# Patient Record
Sex: Female | Born: 1955 | Race: White | Hispanic: No | Marital: Single | Smoking: Former smoker
Health system: Southern US, Community
[De-identification: ages and names within clinical notes are randomized; demographics above are authoritative.]

## PROBLEM LIST (undated history)

## (undated) DIAGNOSIS — M1990 Unspecified osteoarthritis, unspecified site: Secondary | ICD-10-CM

## (undated) DIAGNOSIS — E119 Type 2 diabetes mellitus without complications: Secondary | ICD-10-CM

## (undated) DIAGNOSIS — E039 Hypothyroidism, unspecified: Secondary | ICD-10-CM

## (undated) DIAGNOSIS — T7840XA Allergy, unspecified, initial encounter: Secondary | ICD-10-CM

## (undated) DIAGNOSIS — Z8739 Personal history of other diseases of the musculoskeletal system and connective tissue: Secondary | ICD-10-CM

## (undated) DIAGNOSIS — K219 Gastro-esophageal reflux disease without esophagitis: Secondary | ICD-10-CM

## (undated) DIAGNOSIS — G479 Sleep disorder, unspecified: Secondary | ICD-10-CM

## (undated) DIAGNOSIS — I10 Essential (primary) hypertension: Secondary | ICD-10-CM

## (undated) DIAGNOSIS — Z8709 Personal history of other diseases of the respiratory system: Secondary | ICD-10-CM

## (undated) DIAGNOSIS — C541 Malignant neoplasm of endometrium: Secondary | ICD-10-CM

## (undated) DIAGNOSIS — C439 Malignant melanoma of skin, unspecified: Secondary | ICD-10-CM

## (undated) DIAGNOSIS — K5792 Diverticulitis of intestine, part unspecified, without perforation or abscess without bleeding: Secondary | ICD-10-CM

## (undated) HISTORY — PX: BREAST BIOPSY: SHX20

## (undated) HISTORY — PX: OTHER SURGICAL HISTORY: SHX169

## (undated) HISTORY — PX: THYROIDECTOMY: SHX17

## (undated) HISTORY — DX: Allergy, unspecified, initial encounter: T78.40XA

## (undated) HISTORY — PX: CHOLECYSTECTOMY: SHX55

## (undated) HISTORY — DX: Type 2 diabetes mellitus without complications: E11.9

---

## 1997-05-20 ENCOUNTER — Other Ambulatory Visit: Admission: RE | Admit: 1997-05-20 | Discharge: 1997-05-20 | Payer: Self-pay | Admitting: Endocrinology

## 2000-07-10 ENCOUNTER — Inpatient Hospital Stay (HOSPITAL_COMMUNITY): Admission: EM | Admit: 2000-07-10 | Discharge: 2000-07-14 | Payer: Self-pay

## 2001-06-18 ENCOUNTER — Other Ambulatory Visit: Admission: RE | Admit: 2001-06-18 | Discharge: 2001-06-18 | Payer: Self-pay | Admitting: Internal Medicine

## 2001-07-01 ENCOUNTER — Encounter: Admission: RE | Admit: 2001-07-01 | Discharge: 2001-07-01 | Payer: Self-pay | Admitting: Internal Medicine

## 2001-07-01 ENCOUNTER — Encounter: Payer: Self-pay | Admitting: Internal Medicine

## 2005-08-13 ENCOUNTER — Emergency Department (HOSPITAL_COMMUNITY): Admission: EM | Admit: 2005-08-13 | Discharge: 2005-08-14 | Payer: Self-pay | Admitting: Emergency Medicine

## 2005-08-13 ENCOUNTER — Encounter: Payer: Self-pay | Admitting: Emergency Medicine

## 2006-05-21 ENCOUNTER — Encounter (INDEPENDENT_AMBULATORY_CARE_PROVIDER_SITE_OTHER): Payer: Self-pay | Admitting: *Deleted

## 2006-05-21 ENCOUNTER — Other Ambulatory Visit: Admission: RE | Admit: 2006-05-21 | Discharge: 2006-05-21 | Payer: Self-pay | Admitting: Interventional Radiology

## 2006-05-21 ENCOUNTER — Encounter: Admission: RE | Admit: 2006-05-21 | Discharge: 2006-05-21 | Payer: Self-pay | Admitting: Surgery

## 2007-04-06 ENCOUNTER — Other Ambulatory Visit: Admission: RE | Admit: 2007-04-06 | Discharge: 2007-04-06 | Payer: Self-pay | Admitting: Family Medicine

## 2008-08-26 ENCOUNTER — Encounter (INDEPENDENT_AMBULATORY_CARE_PROVIDER_SITE_OTHER): Payer: Self-pay | Admitting: Surgery

## 2008-08-26 ENCOUNTER — Ambulatory Visit (HOSPITAL_COMMUNITY): Admission: RE | Admit: 2008-08-26 | Discharge: 2008-08-27 | Payer: Self-pay | Admitting: Surgery

## 2009-09-29 ENCOUNTER — Emergency Department (HOSPITAL_COMMUNITY): Admission: EM | Admit: 2009-09-29 | Discharge: 2009-09-29 | Payer: Self-pay | Admitting: Family Medicine

## 2009-11-14 ENCOUNTER — Ambulatory Visit: Payer: Self-pay | Admitting: Internal Medicine

## 2009-11-22 ENCOUNTER — Ambulatory Visit (HOSPITAL_COMMUNITY): Admission: RE | Admit: 2009-11-22 | Discharge: 2009-11-22 | Payer: Self-pay | Admitting: Internal Medicine

## 2009-12-28 ENCOUNTER — Encounter (INDEPENDENT_AMBULATORY_CARE_PROVIDER_SITE_OTHER): Payer: Self-pay | Admitting: Family Medicine

## 2009-12-28 LAB — CONVERTED CEMR LAB
AST: 35 units/L (ref 0–37)
BUN: 13 mg/dL (ref 6–23)
CO2: 30 meq/L (ref 19–32)
Calcium: 9.4 mg/dL (ref 8.4–10.5)
Chloride: 104 meq/L (ref 96–112)
Cholesterol: 157 mg/dL (ref 0–200)
Creatinine, Ser: 0.76 mg/dL (ref 0.40–1.20)
HDL: 35 mg/dL — ABNORMAL LOW (ref >39)
Total CHOL/HDL Ratio: 4.5
Triglycerides: 193 mg/dL — ABNORMAL HIGH (ref <150)

## 2010-04-12 LAB — POCT I-STAT, CHEM 8
BUN: 10 mg/dL (ref 6–23)
Calcium, Ion: 1.1 mmol/L — ABNORMAL LOW (ref 1.12–1.32)
Chloride: 102 mEq/L (ref 96–112)
Creatinine, Ser: 0.8 mg/dL (ref 0.4–1.2)
Glucose, Bld: 154 mg/dL — ABNORMAL HIGH (ref 70–99)

## 2010-05-06 LAB — BASIC METABOLIC PANEL
BUN: 10 mg/dL (ref 6–23)
Chloride: 101 mEq/L (ref 96–112)
Chloride: 102 mEq/L (ref 96–112)
Creatinine, Ser: 0.81 mg/dL (ref 0.4–1.2)
GFR calc Af Amer: >60 mL/min (ref >60)
Glucose, Bld: 179 mg/dL — ABNORMAL HIGH (ref 70–99)
Potassium: 2.8 mEq/L — ABNORMAL LOW (ref 3.5–5.1)

## 2010-05-06 LAB — CREATININE, SERUM: GFR calc Af Amer: >60 mL/min (ref >60)

## 2010-05-06 LAB — HEMOGLOBIN: Hemoglobin: 12.7 g/dL (ref 12.0–15.0)

## 2010-05-06 LAB — PREGNANCY, URINE: Preg Test, Ur: NEGATIVE

## 2010-05-06 LAB — HEMOGLOBIN AND HEMATOCRIT, BLOOD
HCT: 42 % (ref 36.0–46.0)
Hemoglobin: 14.6 g/dL (ref 12.0–15.0)

## 2010-05-06 LAB — CALCIUM: Calcium: 7.7 mg/dL — ABNORMAL LOW (ref 8.4–10.5)

## 2010-06-12 NOTE — Op Note (Signed)
NAME:  Donna [name]                  ACCOUNT NO.:  [id number]   MEDICAL RECORD NO.:  [id number]          PATIENT TYPE:  AMB   LOCATION:  DAY                          FACILITY:  [facility]   PHYSICIAN:  Ardeth Sportsman, MD     DATE OF BIRTH:  10-02-1955   DATE OF PROCEDURE:  08/26/2008  DATE OF DISCHARGE:                               OPERATIVE REPORT   PRIMARY CARE PHYSICIAN:  Carma Leaven, DO, Scherrie Bateman. Sandridge, PA-C as  well as Sigmund Hazel, M.D.   SURGEON:  Ardeth Sportsman, MD   ASSISTANT:  1. Angelia Mould. Derrell Lolling, M.D., F.A.C.S.  2. Adolph Pollack, M.D., F.A.C.S.   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   POSTOPERATIVE DIAGNOSES:  1. Multinodular nodular goiter with increasing globus and symptoms.  2. Abnormal skin mole in left infraclavicular fossa.   PROCEDURE PERFORMED:  1. Total thyroidectomy.  2. Excision of left infraclavicular skin mole.   ANESTHESIA:  1. General anesthesia.  2. Local anesthetic in field block.   SPECIMENS:  Thyroid.  It came in 2 pieces.  The smaller piece is the  right superior pole with a double long stitch through the superior pole.  The larger mass has a short stitch in the right inferior pole and a long  single stitch in the left superior pole.   DRAINS:  A 15-French Blake drain comes through the left infraclavicular  fossa and then into the left neck going superficially with the tail  going below the strap muscles.   ESTIMATED BLOOD LOSS:  200 mL.   COMPLICATIONS:  None apparent.   INDICATIONS:  Donna [name] is a 55 year old female who has been struggling  with enlarging neck mass.  I saw her 2 years ago for multinodular  goiter.  There were some nodules that were concerning but biopsy is  consistent with nonneoplastic goiter.  At the time she was not  interested in surgery.  However, 2 years later they have gotten much  larger despite efforts to control it.  Her thyroid stimulating  hormone  level is actually low and she has been on hormonal suppression for some  time with enlarging masses.   Anatomy/physiology of neck and thyroid and parathyroid function was  discussed.  Pathophysiology of goiter was discussed.  Differential  diagnoses discussed.  After discussion, recommendations were made for  total thyroidectomy.  Risks, benefits, alternatives were discussed in  detail.  Questions answered.  She agreed to proceed.   FINDINGS:  She had a mildly enlarged right thyroid lobe with a dense  right inferior calcified nodule near her trachea.  Her left thyroid  gland was quite large, up to 15 cm in size and very engorged but less  discrete masses.   DESCRIPTION OF PROCEDURE:  Informed consent was confirmed.  The patient  received IV antibiotics just prior to surgery.  She had sequential  pressure devices active during the entire case.  She underwent general  anesthesia without difficulty.  She was positioned with a pad placed  between the shoulder blades  to gently extend her neck but avoid  hyperextension.  Her arms were tucked.  She was placed in the beach-  chair reverse Trendelenburg position.  Her chin to upper chest were  prepped and draped in sterile fashion.  Surgical time-out confirmed our  plan.   I initially started with a 6-cm and expanded to an 8-cm collar incision  about 1-1/2 fingerbreadths above the clavicles right over the top part  of the mass.  I was able to come through the platysma and create  superior and inferior subplatysmal planes.  The anatomy on the strap  muscles was rather twisted but we eventually found the midline and went  through that safely with cautery.  The strap muscles were stretched and  flattened.   Attention was turned towards the right side.  I freed the strap muscles  off the thyroid.  Initially when I was freeing off, I was actually  freeing off the enlarged left thyroid gland medial aspect which was over  the midline.   Eventually I was able to come down and find the isthmus  and carry it over towards more the right gland.  I was able to free and  mobilize the lateral part of the thyroid gland more posteriorly.  I  found the middle vein, was able to see that and isolate that with clips  and ultrasonic dissection.  Most of the dissection done anteriorly was  careful blunt and focused cautery and ultrasonic dissection.  On the  posterior wall, cautery was used at a minimum and only on the anterior  trachea.   With the middle part of the thyroid lobe mobilized, attention was turned  toward the right inferior lobe.  I was gradually able to come around  that and find a good candidate for the inferior parathyroid gland.  I  stayed very close on the anterior surface of the thyroid and came across  it.  The inferior thyroid vessels were isolated, skeletonizing and  controlled with clips and ultrasonic dissection.  She had a dense  inferior nodule on the medial aspect of the inferior part of the right  thyroid gland which I was able to carefully peel that off the trachea  posteriorly to anteriorly.   Attention was turned towards the right superior pole.  I eventually was  able to come off and free things off bluntly laterally and then come on  the medial part of the superior pole and freed off the cricopharyngeal  muscles until it came to a point more cephalad.  A couple good  candidates for superior parathyroid glands were seen and carefully  protected at all times.  I came to the superior thyroid vessels.  These  were isolated and controlled using 2-0 silk ties and clips and  ultrasonic dissection.  I eventually was able to free that off.  With  that, I was able to mobilize the thyroid gland well and peel that off.  A good candidate for the right laryngeal nerve could be seen going  posterior to the inferior parathyroid gland and going more deeply and I  stayed superficial to it.  Eventually I peeled off the  thyroid gland off  the trachea and used cautery to help free it off anteriorly and come  over a little bit to left side.  However, since the anatomy was rather  distorted, I did not want to go more laterally on the other side.   Next attention was turned to the left side.  Dissection was carried out  in a mirror image fashion on the left side.  The inferior pole actually  came up much more easily and I saw the inferior parathyroid gland on the  left side well and preserved that.  With that, I could come around to a  very large left middle vein and controlled that with 2-0 silk ties and  clips.  With that, it was hard to come up over the top but eventually  was able to bluntly free off the strap muscles anteriorly.  I came  posteriorly and stayed close to the gland for a little while.  With that  I finally could pop out the very large thyroid gland through the  incision and better see the superior gland.  Carefully peeled off  attachments off the superior pole using ultrasonic dissection superiorly  until I could see the superior vessels that were actually a little more  posterior than I initially expected and controlled those again with silk  ties and clips and ultrasonic dissection.  With that, I could come  around the horn and free off the remaining attachments to the anterior  trachea and with that the specimen was removed.   Note there was some bleeding of some very dilated veins on the left  superior pole and that is where most of the bleeding occurred.  We  eventually were able to control that.  Inspection was made on the left  side and the left recurrent laryngeal nerve could be seen going  posterior to the inferior parathyroid complex and I stayed superficial  to that and away from that so I felt pretty confident that that nerve  avoided injury as well.   Copious irrigation was done.  There were some small oozers on the right  and left inferior parathyroid glands that were  controlled by elevating  and clipping on the domes.  There were a couple of oozers on the trachea  that I controlled with touch-point cautery.  Copious irrigation done  with clear return at the end.  I placed some postage stamp size  Surgicels in both fossae as well.  Looking down, I could see the carotid  gland very well and actually the left thyroid gland had gone all the way  down to the spine and wrapped around the carotid once it had been all  freed off, but all vessels seemed viable and safe.  The strap muscles  were a little beaten up on the left side since it was extremely  stretched by the very dilated gland, but I was able to reapproximate it  using interrupted 3-0 Vicryl stitches.   I saw an abnormal skin mole in the infraclavicular fossa which was  removed by scalpel only.  I tunneled a 15-French Blake drain from the  infraclavicular fossa over the clavicle and then brought out through the  wound.  There was initially some bleeding I think from an external  jugular side branch but, after pressure was held for 15 minutes and re-  inspection and the branch was isolated and controlled, hemostasis was  excellent.  The drain was secured to the skin using a 2-0 nylon stitch.  I rolled the drain so that it came down on the inferior aspect in the  subplatysmal plane and then curled around and then went in through the  left strap muscle right over on top of the trachea to help cover up both  layers well.  The platysma was reapproximated using interrupted  3-0  Vicryl stitches.  Skin was closed using 4-0 Monocryl running stitch and  Steri-Strips.  The patient was extubated and sent to recovery to stable  condition.   I discussed postoperative care with the patient in detail and will  discuss again with the family now.      Ardeth Sportsman, MD  Electronically Signed     SCG/MEDQ  D:  08/26/2008  T:  08/26/2008  Job:  [id number]   cc:   Sigmund Hazel, M.D.  Fax: [phone]   Scherrie Bateman. [place], PA   Oswaldo Conroy [place], [place]  Fax: [phone]

## 2010-06-15 NOTE — H&P (Signed)
[place]  Patient:    Donna [name], Donna [name]                         MRN: [id number] Adm. Date:  [id number] Attending:  Anastasio Auerbach CC:         Dellis Anes. Idell Pickles, M.D.   History and Physical  DATE OF BIRTH:  08/27/1955.  CHIEF COMPLAINT:  "My stomach hurts."  HISTORY OF PRESENT ILLNESS:  Donna [name] is a 55 year old heavy-set Caucasian female with a history of thyroid disease.  Presents with a two-day history of abdominal pain.  It started out feeling like menstrual cycle cramps but over the past 24 hours has progressed to severe pain increased with movement or walking.  Donna [name] had a hard-formed stool on June 12.  Donna [name] has not noted any blood in Donna stools.  No vomiting, no fevers documented.  Donna [name] was seen in the clinic today and noted to have some rebound tenderness and is referred to the emergency room for further evaluation.  Donna [name] has had an open cholecystectomy in the past but no previous surgeries or history of diverticulitis.  Donna [name] has no risk factors at this time for pelvic inflammatory disease.  ALLERGIES:  No known drug allergies.  CURRENT MEDICATIONS:  Levothroid 25 mcg q.d.  PAST MEDICAL HISTORY: 1. Goiter/hypothyroidism. 2. Perimenopausal    a. Sporadic and irregular periods. 3. Tobacco use. 4. Cholecystectomy 17 years ago. 5. Seasonal allergies as well as a history of bronchitis.  SOCIAL HISTORY:  Donna [name] is married, but Donna husband has not lived with Donna for two years.  Donna [name] smokes a few cigarettes daily.  Donna [name] denies any alcohol use.  Donna [name] works for [employer].  FAMILY HISTORY:  Donna [name] died with diabetes and cancer.  Donna mother is 5 years old and currently lives with Donna.  REVIEW OF SYSTEMS:  No visual changes.  No difficulty swallowing.  No significant headache or stiff neck.  No cough or hemoptysis.  No chest pain. Abdominal symptoms as described above.  No difficulty urinating.  No joint swelling.  No edema.  No history  of cardiac, kidney, or liver problems. Thyroid disease as mentioned above, controlled.  No diabetes or seizures.  PHYSICAL EXAMINATION:  GENERAL:  Donna [name] is alert and cooperative and in no acute distress when resting comfortably.  VITAL SIGNS:  Temperature 97.5, BP 128/59, heart rate 85, respiratory rate 21, oxygen saturation 95% on room air.  HEENT:  Pupils are equal, round and reactive to light.  Extraocular muscles intact.  Cranial nerves 2-12 grossly intact.  Atraumatic.  Normocephalic. Mucous membranes are slightly dry.  Pharynx is clear.  NECK:  Supple.  Diffuse goiter noted.  No JVD.  No bruits.  LUNGS:  Slightly decreased at the bases without evidence of crackles or rhonchi.  No wheezing.  HEART:  Regular.  Normal S1, S2.  No audible murmurs, rubs, or gallops.  ABDOMEN:  No audible bowel sounds.  Obese.  Not significantly distended. Tender suprapubically and paraumbilically.  There are peritoneal signs with evidence of discomfort both pushing in and releasing.  No palpable mass.  No organomegaly.  EXTREMITIES:  No edema.  No ______ .  Donna [name] does have resolving poison ivy on Donna left upper extremity.  NEUROLOGIC EXAM:  Donna [name] is awake, alert, and oriented x 3.  Motor exam reveals 5/5 strength bilaterally.  Sensation intact.  Gait not assessed. Reflexes 2+ bilaterally.  Cranial nerves mentioned above.  STUDIES:  EKG:  Normal sinus rhythm.  Rate 81.  X-rays from the office (I did not see the hard copy but verbal report from Dr. Clarene Duke):  Some air fluid levels.  No definite obstruction.  Labs:  Hemoglobin 12.1, MCV 86.  WBC 16,300.  ANC 15,000.  Morphology unremarkable.  Platelet count 300.  Sodium 138, potassium 3.0, chloride 108, bicarb 24, glucose 149, BUN 10, creatinine 0.8, total bilirubin 0.9, alkaline phosphatase 66, SGOT 13, SGPT 13, albumin 2.9, calcium 8.1.  PT 15.5 (normal 11.5-14.7), PTT 34, amylase and lipase normal.  Urine pregnancy  negative. Urinalysis:  Specific gravity is 1.028, 100 glucose, small bilirubin, 15 ketones, moderate blood, 30 protein, negative leukocyte esterase and nitrite. Microscopic:  0-3 WBCs, 4-6 RBCs.  Blood cultures x 2 done.  CK 49, MB 0.5, troponin-I 0.01.  IMPRESSION: 1. Acute sigmoid diverticulitis.  Donna [name] is  55 years old and presents    with Donna first episode of diverticulitis.  Donna [name] does describe more loose    stools rather than constipation as Donna norm.  CT scan confirmed some    stranding as well as some microperforations but no evidence of free air in    the abdominal cavity.  Donna [name] is admitted for IV fluids and IV Unasyn.  We    will use Demerol and Phenergan for pain and nausea control.  Donna [name] will    remain n.p.o.  At this point, I have not contacted surgery as there is no    clear indication that this is a surgical abdomen.  Hopefully, antibiotics    will clear this up, and Donna [name] will need to have repeat CT in the future as    well as colonoscopy once inflammation has resolved.  We will educate Donna on    a low residue diet and encourage Donna to increase fiber. 2. Vasovagal episodes.  Donna [name] describes a history of feeling faint when    Donna [name] has Donna blood drawn.  In the emergency room, Donna [name] had one episode when    Donna IV was started where Donna heart rate dropped, and Donna [name] felt flushed and    heard a ringing in Donna ears.  Donna [name] never lost consciousness totally.  Donna [name]    responded to IV fluids.  Donna [name] had a second episode in the emergency room    about five minutes after the second blood culture was drawn.  At that time,    Donna [name] was on a monitor, and Donna heart rate dropped into the 50s, and Donna    systolic blood pressure dropped to 103.  Donna [name] felt flushed.  These vital    signs rebounded quickly.  Donna [name] had no associated chest pain.  EKG was normal    and noncardiac enzymes were negative.  At this point, I do not feel we need    to do serial cardiac enzymes.  I have admitted Donna to the  stepdown unit for    close observation.  Certainly, with Donna diverticulitis, sepsis could be a     cause of low blood pressure but when it is associated with IV sticks, I    feel more confident that this is vasovagal-mediated. 3. Dehydration/hypokalemia.  IV fluids and IV potassium are placed.  Check    magnesium. 4. Mildly elevated protime.  Significance unclear.  Donna [name] does not appear to be    in DIC at this time.  I will repeat if clinically indicated. 5. Mild hypoglycemia.  Nonfasting 149.  I am going to give Donna D5 IV fluids,    and we will simply have to see how Donna [name] does with this.  Donna [name] did have    diabetes.  Certainly under the circumstances, a nonfasting sugar of 149 is    not unexpected. 6. Tobacco use.  Mrs. Strength continues to smoke.  Donna [name] declines nicotine patch.    I will put Donna on a Combivent inhaler and use incentive spirometry.  Donna [name]    does have a history of bronchitis. 7. Hypothyroidism.  Donna [name] does have a goiter, and we will continue Donna home dose    of Synthroid.  If Donna [name] vomits this up, we will switch to IV. 8. Mild hematuria.  Will culture the urine.  There were 4-6 RBCs.  If there is    infection, Unasyn should be adequate coverage.  Repeat as an outpatient to    ensure this has resolved in the future.DD:  07/10/00 TD:  07/10/00 Job: [id number]

## 2010-06-15 NOTE — Discharge Summary (Signed)
[place]. [facility]  Patient:    Donna [name], Donna [name]                         MRN: [id number] Adm. Date:  [id number] Disc. Date: [id number] Attending:  Anastasio Auerbach CC:         Donna [name]. Donna [name], M.D.   Discharge Summary  DATE OF BIRTH:  1955/12/12.  DISCHARGE DIAGNOSES: 1. Acute sigmoid diverticulitis.    a. CT scan:  Diverticulitis with microperforations.    b. No previous noted history of diverticular disease. 2. Dehydration secondary to above. 3. Vasovagal episodes x3.    a. Related to IV, blood draw, and pain.    b. History of this in the past. 4. Hypokalemia secondary to dehydration. 5. Leukocytosis, resolved.    a. Admission WBC 6300, at discharge 7400. 5. Mildly elevated pro time, etiology unclear.    a. Asymptomatic. 6. Hyperglycemia on admission.    a. Follow-up fasting CBG less than 100. 7. Tobacco use. 8. Poison ivy.    a. Left arm, right leg. 9. Reactive anemia, resolved.    a. Discharge hemoglobin 12.0. 10. Cholecystectomy 17 years ago. 11. Seasonal allergies. 12. History of bronchitis. 13. Perimenopausal.     a. Sporadic and irregular. 14. Hypothyroidism/goiter.     a. On replacement. 15. Borderline hypertension.  DISCHARGE MEDICATIONS: 1. Levothroid 25 mcg q.d. 2. (New) Augmentin 875 mg 1 p.o. q.12h. x10 days (total 14 day    therapy). 3. (New) Vicodin 5/500 one to two q.4h. p.r.n. pain, maximum    6 per day (dispense #20, no refills). 4. (New) Combivent MDI with spacer 2 puffs q.i.d.  CONDITION UPON DISCHARGE:  Stable.  Afebrile.  Tolerating liquids and low residual diet.  Ambulating without difficulty.  DISPOSITION:  Home with family.  RECOMMENDED ACTIVITY:  As tolerated.  No strenuous.  May return to work next Friday.  Can get permit slip from Dr. Idell [name] on Thursday appointment.  No driving until after follow-up with Dr. Idell [name].  This is because of pain.  RECOMMENDED DIET:  Low residue, high fiber.  Drink plenty of  water.  SPECIAL INSTRUCTIONS: 1. Call or return if fevers or pain worsens. 2. Try to quit smoking totally. 3. Follow up with Dr. Foye Deer on Thursday, June 20, at 10:45.  At    that visit, I would check a BMET to make sure a potassium is okay and    also follow up her borderline high blood pressure.  Schedule follow-up    CT scan in approximately one month and GI referral for colonoscopy in    6-8 weeks.  CONSULTANTS:  None.  PROCEDURES:  Abdominal and pelvic CT without contrast (June 13):  No acute abnormality in the abdomen.  No evidence of kidney stones.  Sigmoid diverticulitis.  There were numerous diverticula seen throughout the   sigmoid colon.  Significant stranding seen was then the fact around the sigmoid colon. There appears to be a couple of tiny collections of gas outside the lumina of the sigmoid colon which may represent tiny microperforations.  No large fluid-filled collections or free fluid is seen.  HOSPITAL COURSE: #1 - ACUTE SIGMOID DIVERTICULITIS:  [name] is a 55 year old Caucasian female who presents with a two-day history of worsening lower abdominal pain. On examination, she had peritoneal signs and CT scan does demonstrate acute diverticulitis.  She is admitted for IV fluids and pain control as well as IV antibiotics.  Unasyn was chosen and she will go home on Augmentin to complete a total of 14 days of therapy.  She is requiring a couple of Vicodin at night still for pain and we will allow her to have this as well.  There were microperforations seen.  During this hospital stay, she defervesced.  If she were to have recurrent fevers or worsening abdominal pain, would repeat stat CT scan to make sure there has been no abscess formation and certainly plain films to look for free air if she has more pain.  In the long-run, I would repeat a CT scan in approximately four weeks to ensure resolution.  She is not on steroids or any other immunosuppressive type  agents.  She will also need a colonoscopy once the inflammation has resolved.  #2 - DEHYDRATION/HYPOKALEMIA:  Replaced orally and IV.  #3 - VASOVAGAL REACTIONS:  Ms. Pamintuan did have episodes of flushing and hypotension associated with bradycardia 3x during this hospital stay. Both of them were within 24 hours of admission and associated with either blood draw, IV stick, or IV potassium, causing pain.  This is a common occurrence for her. We did watch her in the step-down unit because of this phenomenon.  She was doing well prior to discharge.  No further treatment or follow-up for this. She is just aware to let people know that if she has blood drawn, she needs to e sitting down.  #4 - POISON IVY:  Ms. Kartes has some poison ivy on her arm from before.  We felt like she may have contaminated her leg and used some hydrocortisone cream for this.  Stable.  #5 - ANEMIA:  Hemoglobin did drop to a low of 11.  At the time of discharge, it was back up to 12.  I suspect this was reactionary to the acute infection and illness.  She was guaiac-negative. DD:  07/14/00 TD:  07/14/00 Job: [id number]

## 2010-07-10 ENCOUNTER — Other Ambulatory Visit: Payer: Self-pay | Admitting: Family Medicine

## 2011-12-07 ENCOUNTER — Emergency Department (HOSPITAL_COMMUNITY): Payer: Self-pay

## 2011-12-07 ENCOUNTER — Encounter (HOSPITAL_COMMUNITY): Payer: Self-pay | Admitting: Physical Medicine and Rehabilitation

## 2011-12-07 ENCOUNTER — Emergency Department (HOSPITAL_COMMUNITY)
Admission: EM | Admit: 2011-12-07 | Discharge: 2011-12-07 | Disposition: A | Discharge status: Home / Self Care | Payer: Self-pay | Attending: Emergency Medicine | Admitting: Emergency Medicine

## 2011-12-07 DIAGNOSIS — I10 Essential (primary) hypertension: Secondary | ICD-10-CM | POA: Insufficient documentation

## 2011-12-07 DIAGNOSIS — E039 Hypothyroidism, unspecified: Secondary | ICD-10-CM | POA: Insufficient documentation

## 2011-12-07 DIAGNOSIS — Z79899 Other long term (current) drug therapy: Secondary | ICD-10-CM | POA: Insufficient documentation

## 2011-12-07 DIAGNOSIS — Z760 Encounter for issue of repeat prescription: Secondary | ICD-10-CM | POA: Insufficient documentation

## 2011-12-07 DIAGNOSIS — M1990 Unspecified osteoarthritis, unspecified site: Secondary | ICD-10-CM | POA: Insufficient documentation

## 2011-12-07 DIAGNOSIS — M1710 Unilateral primary osteoarthritis, unspecified knee: Secondary | ICD-10-CM

## 2011-12-07 DIAGNOSIS — K5732 Diverticulitis of large intestine without perforation or abscess without bleeding: Secondary | ICD-10-CM | POA: Insufficient documentation

## 2011-12-07 DIAGNOSIS — M129 Arthropathy, unspecified: Secondary | ICD-10-CM | POA: Insufficient documentation

## 2011-12-07 HISTORY — DX: Unspecified osteoarthritis, unspecified site: M19.90

## 2011-12-07 HISTORY — DX: Essential (primary) hypertension: I10

## 2011-12-07 HISTORY — DX: Hypothyroidism, unspecified: E03.9

## 2011-12-07 HISTORY — DX: Diverticulitis of intestine, part unspecified, without perforation or abscess without bleeding: K57.92

## 2011-12-07 MED ORDER — METOPROLOL TARTRATE 25 MG PO TABS
50.0000 mg | ORAL_TABLET | Freq: Two times a day (BID) | ORAL | Status: DC
  Administered 2011-12-07: 50 mg via ORAL
  Filled 2011-12-07: qty 2

## 2011-12-07 MED ORDER — LEVOTHYROXINE SODIUM 25 MCG PO TABS: 25.0000 ug | ORAL_TABLET | Freq: Every day | ORAL | Status: DC

## 2011-12-07 MED ORDER — METOPROLOL TARTRATE 50 MG PO TABS: 50.0000 mg | ORAL_TABLET | Freq: Two times a day (BID) | ORAL | Status: DC

## 2011-12-07 MED ORDER — HYDROCHLOROTHIAZIDE 25 MG PO TABS: 25.0000 mg | ORAL_TABLET | Freq: Every day | ORAL | Status: DC

## 2011-12-07 MED ORDER — HYDROCHLOROTHIAZIDE 25 MG PO TABS
25.0000 mg | ORAL_TABLET | Freq: Every day | ORAL | Status: DC
  Administered 2011-12-07: 25 mg via ORAL
  Filled 2011-12-07: qty 1

## 2011-12-07 MED ORDER — TRAMADOL HCL 50 MG PO TABS: 50.0000 mg | ORAL_TABLET | Freq: Three times a day (TID) | ORAL | Status: DC | PRN

## 2011-12-07 NOTE — ED Notes (Addendum)
Pt presents to department for evaluation of R knee pain. States chronic issues with pain, was a patient at [facility]. States she also recently ran out of all medications. 6/10 pain at the time. Ambulatory to triage. No signs of distress noted. Pt is alert and oriented x4. No signs of acute distress at the time.

## 2011-12-07 NOTE — ED Provider Notes (Signed)
History     CSN: [id number]  Arrival date & time 12/07/11  4098   First MD Initiated Contact with Patient 12/07/11 1030      Chief Complaint  Patient presents with  . Knee Pain    (Consider location/radiation/quality/duration/timing/severity/associated sxs/prior treatment) Patient is a 56 y.o. female presenting with knee pain. The history is provided by the patient.  Knee Pain This is a chronic problem. Episode onset: several months ago, the last few days however it increased, today it is slightly better than yesterday. Pertinent negatives include no chest pain, no abdominal pain and no shortness of breath. The symptoms are aggravated by bending and walking. The symptoms are relieved by rest.  Pt does not have a primary care doctor now.  She used to take ultram for pain.  She also ran out of her regular medications. Past Medical History  Diagnosis Date  . Hypertension   . Hypothyroid   . Diverticulitis   . Arthritis     No past surgical history on file.  No family history on file.  History  Substance Use Topics  . Smoking status: Never Smoker   . Smokeless tobacco: Not on file  . Alcohol Use: No    OB History    Grav Para Term Preterm Abortions TAB SAB Ect Mult Living                  Review of Systems  Respiratory: Negative for shortness of breath.   Cardiovascular: Negative for chest pain.  Gastrointestinal: Negative for abdominal pain.  All other systems reviewed and are negative.    Allergies  Morphine and related  Home Medications   Current Outpatient Rx  Name  Route  Sig  Dispense  Refill  . CYCLOBENZAPRINE HCL 10 MG PO TABS   Oral   Take 10 mg by mouth 3 (three) times daily as needed. For muscle spasms         . FAMOTIDINE 10 MG PO TABS   Oral   Take 10 mg by mouth daily as needed. For heartburn         . HYDROCHLOROTHIAZIDE 25 MG PO TABS   Oral   Take 25 mg by mouth daily.         . IBUPROFEN 800 MG PO TABS   Oral   Take 800 mg by  mouth every 8 (eight) hours as needed. For pain         . LEVOTHYROXINE SODIUM 25 MCG PO TABS   Oral   Take 25 mcg by mouth daily.         Marland Kitchen LORATADINE 10 MG PO TABS   Oral   Take 10 mg by mouth daily.         Marland Kitchen METOPROLOL TARTRATE 50 MG PO TABS   Oral   Take 50 mg by mouth 2 (two) times daily.         . TRAMADOL HCL 50 MG PO TABS   Oral   Take 50 mg by mouth every 8 (eight) hours as needed. For pain           BP 184/112  Pulse 117  Temp 97.4 F (36.3 C) (Oral)  Resp 20  SpO2 95%  Physical Exam  Nursing note and vitals reviewed. Constitutional: She appears well-developed and well-nourished. No distress.  HENT:  Head: Normocephalic and atraumatic.  Right Ear: External ear normal.  Left Ear: External ear normal.  Eyes: Conjunctivae normal are normal. Right eye exhibits  no discharge. Left eye exhibits no discharge. No scleral icterus.  Neck: Neck supple. No tracheal deviation present.  Cardiovascular: Normal rate, regular rhythm and intact distal pulses.   Pulmonary/Chest: Effort normal and breath sounds normal. No stridor. No respiratory distress. She has no wheezes. She has no rales.  Abdominal: Soft. Bowel sounds are normal. She exhibits no distension. There is no tenderness. There is no rebound and no guarding.  Musculoskeletal: She exhibits no edema and no tenderness.       Right knee: She exhibits effusion. She exhibits normal range of motion, no deformity, no laceration and no erythema. tenderness found.  Neurological: She is alert. She has normal strength. No sensory deficit. Cranial nerve deficit:  no gross defecits noted. She exhibits normal muscle tone. She displays no seizure activity. Coordination normal.  Skin: Skin is warm and dry. No rash noted.  Psychiatric: She has a normal mood and affect.    ED Course  Procedures (including critical care time)  Labs Reviewed - No data to display Dg Knee Complete 4 Views Right  12/07/2011  *RADIOLOGY  REPORT*  Clinical Data: Knee pain  RIGHT KNEE - COMPLETE 4+ VIEW  Comparison: None.  Findings: Four views of the right knee submitted.  No acute fracture or subluxation.  There is narrowing of medial joint compartment.  Mild spurring of the medial femoral condyle and medial tibial plateau.  Minimal spurring of the lateral tibial plateau.  Narrowing of patellofemoral joint space.  Small joint effusion.  Spurring of the superior aspect of patella.  IMPRESSION: No acute fracture or subluxation.  Degenerative changes as described above.   Original Report Authenticated By: Natasha Mead, M.D.      1. DJD (degenerative joint disease) of knee   2. Medication refill       MDM  No sign of infection or acute injury.  Recc routine outpt follow up , ortho or PCP.   Medications refilled as requested.       Celene Kras, MD 12/07/11 [phone]

## 2011-12-07 NOTE — ED Notes (Signed)
Patient transported to X-ray 

## 2012-01-04 ENCOUNTER — Emergency Department (HOSPITAL_COMMUNITY)
Admission: EM | Admit: 2012-01-04 | Discharge: 2012-01-04 | Disposition: A | Discharge status: Home / Self Care | Payer: No Typology Code available for payment source | Source: Home / Self Care | Attending: Family Medicine | Admitting: Family Medicine

## 2012-01-04 ENCOUNTER — Encounter (HOSPITAL_COMMUNITY): Payer: Self-pay | Admitting: *Deleted

## 2012-01-04 DIAGNOSIS — I10 Essential (primary) hypertension: Secondary | ICD-10-CM

## 2012-01-04 DIAGNOSIS — J069 Acute upper respiratory infection, unspecified: Secondary | ICD-10-CM

## 2012-01-04 DIAGNOSIS — E039 Hypothyroidism, unspecified: Secondary | ICD-10-CM

## 2012-01-04 MED ORDER — HYDROCOD POLST-CHLORPHEN POLST 10-8 MG/5ML PO LQCR: 5.0000 mL | Freq: Two times a day (BID) | ORAL | Status: DC

## 2012-01-04 MED ORDER — HYDROCHLOROTHIAZIDE 25 MG PO TABS: 25.0000 mg | ORAL_TABLET | Freq: Every day | ORAL | Status: DC

## 2012-01-04 MED ORDER — IPRATROPIUM BROMIDE 0.06 % NA SOLN: 2.0000 | Freq: Four times a day (QID) | NASAL | Status: DC

## 2012-01-04 MED ORDER — LEVOTHYROXINE SODIUM 25 MCG PO TABS: 25.0000 ug | ORAL_TABLET | Freq: Every day | ORAL | Status: DC

## 2012-01-04 NOTE — ED Provider Notes (Signed)
History     CSN: [id number]  Arrival date & time 01/04/12  1011   First MD Initiated Contact with Patient 01/04/12 1041      Chief Complaint  Patient presents with  . Cough    (Consider location/radiation/quality/duration/timing/severity/associated sxs/prior treatment) Patient is a 56 y.o. female presenting with cough. The history is provided by the patient.  Cough This is a new problem. The current episode started more than 2 days ago. The problem has not changed since onset.The cough is non-productive. There has been no fever. Associated symptoms include rhinorrhea. Pertinent negatives include no chest pain, no chills, no ear congestion and no sore throat. She is not a smoker.    Past Medical History  Diagnosis Date  . Hypertension   . Hypothyroid   . Diverticulitis   . Arthritis     History reviewed. No pertinent past surgical history.  History reviewed. No pertinent family history.  History  Substance Use Topics  . Smoking status: Former Games developer  . Smokeless tobacco: Not on file  . Alcohol Use: No    OB History    Grav Para Term Preterm Abortions TAB SAB Ect Mult Living                  Review of Systems  Constitutional: Negative.  Negative for fever and chills.  HENT: Positive for congestion, rhinorrhea and postnasal drip. Negative for sore throat.   Respiratory: Positive for cough.   Cardiovascular: Negative for chest pain.  Gastrointestinal: Negative.     Allergies  Morphine and related  Home Medications   Current Outpatient Rx  Name  Route  Sig  Dispense  Refill  . HYDROCOD POLST-CPM POLST ER 10-8 MG/5ML PO LQCR   Oral   Take 5 mLs by mouth every 12 (twelve) hours. As needed for cough   115 mL   0   . CYCLOBENZAPRINE HCL 10 MG PO TABS   Oral   Take 10 mg by mouth 3 (three) times daily as needed. For muscle spasms         . FAMOTIDINE 10 MG PO TABS   Oral   Take 10 mg by mouth daily as needed. For heartburn         .  HYDROCHLOROTHIAZIDE 25 MG PO TABS   Oral   Take 1 tablet (25 mg total) by mouth daily.   30 tablet   0   . HYDROCHLOROTHIAZIDE 25 MG PO TABS   Oral   Take 1 tablet (25 mg total) by mouth daily.   30 tablet   1   . IBUPROFEN 800 MG PO TABS   Oral   Take 800 mg by mouth every 8 (eight) hours as needed. For pain         . IPRATROPIUM BROMIDE 0.06 % NA SOLN   Nasal   Place 2 sprays into the nose 4 (four) times daily.   15 mL   1   . LEVOTHYROXINE SODIUM 25 MCG PO TABS   Oral   Take 1 tablet (25 mcg total) by mouth daily.   30 tablet   0   . LEVOTHYROXINE SODIUM 25 MCG PO TABS   Oral   Take 1 tablet (25 mcg total) by mouth daily.   30 tablet   1   . LORATADINE 10 MG PO TABS   Oral   Take 10 mg by mouth daily.         Marland Kitchen METOPROLOL TARTRATE 50 MG PO TABS  Oral   Take 1 tablet (50 mg total) by mouth 2 (two) times daily.   60 tablet   0   . TRAMADOL HCL 50 MG PO TABS   Oral   Take 1 tablet (50 mg total) by mouth every 8 (eight) hours as needed. For pain   30 tablet   0     BP 110/74  Pulse 73  Temp 98.2 F (36.8 C) (Oral)  Resp 16  SpO2 95%  Physical Exam  Nursing note and vitals reviewed. Constitutional: She is oriented to person, place, and time. She appears well-developed and well-nourished.  HENT:  Head: Normocephalic.  Right Ear: External ear normal.  Left Ear: External ear normal.  Nose: Mucosal edema and rhinorrhea present.  Mouth/Throat: Oropharynx is clear and moist.  Eyes: Pupils are equal, round, and reactive to light.  Neck: Normal range of motion. Neck supple.  Cardiovascular: Normal rate, regular rhythm, normal heart sounds and intact distal pulses.   Pulmonary/Chest: Effort normal and breath sounds normal.  Abdominal: Bowel sounds are normal. There is no tenderness.  Neurological: She is alert and oriented to person, place, and time.  Skin: Skin is warm and dry.    ED Course  Procedures (including critical care time)  Labs  Reviewed - No data to display No results found.   1. URI (upper respiratory infection)   2. Hypertension   3. Hypothyroidism       MDM          Linna Hoff, MD 01/04/12 [phone]

## 2012-01-04 NOTE — ED Notes (Signed)
Pt reports cough and congestion for the past few days - hx of bronchitis - former smoker - also needs refill on meds until md appointment on jan 2

## 2012-07-01 ENCOUNTER — Other Ambulatory Visit: Payer: Self-pay | Admitting: Internal Medicine

## 2012-07-01 DIAGNOSIS — Z1231 Encounter for screening mammogram for malignant neoplasm of breast: Secondary | ICD-10-CM

## 2012-07-15 ENCOUNTER — Ambulatory Visit (INDEPENDENT_AMBULATORY_CARE_PROVIDER_SITE_OTHER): Payer: 59 | Admitting: Family Medicine

## 2012-07-15 VITALS — BP 145/85 | HR 77 | Temp 98.4°F | Resp 18 | Ht 65.0 in | Wt 241.0 lb

## 2012-07-15 DIAGNOSIS — E119 Type 2 diabetes mellitus without complications: Secondary | ICD-10-CM

## 2012-07-15 DIAGNOSIS — R05 Cough: Secondary | ICD-10-CM

## 2012-07-15 DIAGNOSIS — J0100 Acute maxillary sinusitis, unspecified: Secondary | ICD-10-CM

## 2012-07-15 MED ORDER — FLUTICASONE PROPIONATE 50 MCG/ACT NA SUSP: 2.0000 | Freq: Every day | NASAL | Status: DC

## 2012-07-15 MED ORDER — AMOXICILLIN-POT CLAVULANATE 875-125 MG PO TABS: 1.0000 | ORAL_TABLET | Freq: Two times a day (BID) | ORAL | Status: DC

## 2012-07-15 MED ORDER — HYDROCOD POLST-CHLORPHEN POLST 10-8 MG/5ML PO LQCR: 5.0000 mL | Freq: Every evening | ORAL | Status: DC | PRN

## 2012-07-15 NOTE — Patient Instructions (Addendum)

## 2012-07-15 NOTE — Progress Notes (Signed)
[address]   [place], [place]  [address]   [phone]  Subjective:    Patient ID: Donna [name], female    DOB: [birth year], 56 y.o.   MRN: [id number]  HPI This 57 y.o. female presents for evaluation of cold symptoms.  Onset 06/30/12 went for regular check up with PCP: symptoms started on 06/30/12 at that time; PCP saw PND clear; reassurance; called x 2 since then; recommend Claritin daily.  Daughter in law is PA; recommended reevaluation.  Progressively worsening.  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  Sent home from work.  Called PCP yesterday; called in Claritin again.  Has been taking Claritin D daily.  No fever but +sweats.  Coughing worse with excessive talking.  Recurrent bronchitis.  HA with coughing.  ST and ear itching and burning.  +rhinorrhea yellow green last week but now clear.  History of pleurisy.  +nasal congestion.  +coughing.  No sputum production.  Intermittent SOB with ambulation.  Loose stools only.  Taking Nyquil, Mucinex DM generic equivalent every three hours.  No tobacco; history of asthma as child; sugar last night of 140 which was high for patient.  PCP:  [facility] on [address]   Review of Systems  Constitutional: Positive for diaphoresis. Negative for fever, chills and fatigue.  HENT: Positive for congestion, rhinorrhea, voice change, postnasal drip and sinus pressure. Negative for ear pain, sore throat, sneezing and trouble swallowing.   Eyes: Negative for visual disturbance.  Respiratory: Positive for cough and shortness of breath. Negative for wheezing and stridor.   Gastrointestinal: Negative for nausea, vomiting, abdominal pain and diarrhea.  Skin: Negative for rash.  Neurological: Negative for headaches.    Past Medical History  Diagnosis Date  . Hypertension   . Hypothyroid   . Diverticulitis   . Arthritis   . Allergy   . Diabetes mellitus without complication     Past Surgical History  Procedure Laterality Date  .  Cholecystectomy    . Thyroidectomy      Prior to Admission medications   Medication Sig Start Date End Date Taking? Authorizing Provider  hydrochlorothiazide (HYDRODIURIL) 25 MG tablet Take 1 tablet (25 mg total) by mouth daily. 12/07/11  Yes Celene Kras, MD  ibuprofen (ADVIL,MOTRIN) 800 MG tablet Take 800 mg by mouth every 8 (eight) hours as needed. For pain   Yes Historical Provider, MD  levothyroxine (SYNTHROID, LEVOTHROID) 25 MCG tablet Take 1 tablet (25 mcg total) by mouth daily. 12/07/11  Yes Celene Kras, MD  loratadine (CLARITIN) 10 MG tablet Take 10 mg by mouth daily.   Yes Historical Provider, MD  metFORMIN (GLUCOPHAGE) 500 MG tablet Take 500 mg by mouth 2 (two) times daily with a meal.   Yes Historical Provider, MD  metoprolol (LOPRESSOR) 50 MG tablet Take 1 tablet (50 mg total) by mouth 2 (two) times daily. 12/07/11  Yes Celene Kras, MD  potassium chloride (K-DUR,KLOR-CON) 10 MEQ tablet Take 10 mEq by mouth 2 (two) times daily.   Yes Historical Provider, MD  traMADol (ULTRAM) 50 MG tablet Take 1 tablet (50 mg total) by mouth every 8 (eight) hours as needed. For pain 12/07/11  Yes Celene Kras, MD  Vitamin D, Ergocalciferol, (DRISDOL) 50000 UNITS CAPS Take 50,000 Units by mouth.   Yes Historical Provider, MD  amoxicillin-clavulanate (AUGMENTIN) 875-125 MG per tablet Take 1 tablet by mouth 2 (two) times daily. 07/15/12   Ethelda Chick, MD  chlorpheniramine-HYDROcodone (TUSSIONEX  PENNKINETIC ER) 10-8 MG/5ML LQCR Take 5 mLs by mouth at bedtime as needed. As needed for cough 07/15/12   Ethelda Chick, MD  fluticasone [facility]) 50 MCG/ACT nasal spray Place 2 sprays into the nose daily. 07/15/12   Ethelda Chick, MD  ipratropium (ATROVENT) 0.06 % nasal spray Place 2 sprays into the nose 4 (four) times daily. 01/04/12   Linna Hoff, MD    Allergies  Allergen Reactions  . Morphine And Related     History   Social History  . Marital Status: Single    Spouse Name: N/A    Number of Children:  N/A  . Years of Education: N/A   Occupational History  . Not on file.   Social History Main Topics  . Smoking status: Former Games developer  . Smokeless tobacco: Not on file  . Alcohol Use: Yes  . Drug Use: No  . Sexually Active: No   Other Topics Concern  . Not on file   Social History Narrative  . No narrative on file    Family History  Problem Relation Age of Onset  . Stroke Mother   . Cancer Father   . Cancer Brother        Objective:   Physical Exam  Nursing note and vitals reviewed. Constitutional: She is oriented to person, place, and time. She appears well-developed and well-nourished. No distress.  HENT:  Head: Normocephalic and atraumatic.  Right Ear: External ear normal.  Left Ear: External ear normal.  Nose: Right sinus exhibits maxillary sinus tenderness. Right sinus exhibits no frontal sinus tenderness. Left sinus exhibits maxillary sinus tenderness. Left sinus exhibits no frontal sinus tenderness.  Mouth/Throat: Oropharynx is clear and moist.  Eyes: Conjunctivae and EOM are normal. Pupils are equal, round, and reactive to light.  Neck: Normal range of motion. Neck supple.  Cardiovascular: Normal rate, regular rhythm and normal heart sounds.   Pulmonary/Chest: Effort normal and breath sounds normal. She has no wheezes. She has no rales.  Lymphadenopathy:    She has no cervical adenopathy.  Neurological: She is alert and oriented to person, place, and time.  Skin: She is not diaphoretic.  Psychiatric: She has a normal mood and affect. Her behavior is normal.       Assessment & Plan:  Acute maxillary sinusitis - Plan: amoxicillin-clavulanate (AUGMENTIN) 875-125 MG per tablet, fluticasone (FLONASE) 50 MCG/ACT nasal spray  Cough - Plan: chlorpheniramine-HYDROcodone (TUSSIONEX PENNKINETIC ER) 10-8 MG/5ML LQCR  Type II or unspecified type diabetes mellitus without mention of complication, not stated as uncontrolled   1.  Acute Maxillary Sinusitis:  New.  Rx  for Augmentin and Flonase provided. 2.  Cough:  New.  Secondary to PND; continue Mucinex DM during the day; rx for Tussionex to use qhs. Note for work for 48 hours. 3. DMII: stable; monitor sugars closely with acute illness.  Meds ordered this encounter  Medications  . metFORMIN (GLUCOPHAGE) 500 MG tablet    Sig: Take 500 mg by mouth 2 (two) times daily with a meal.  . potassium chloride (K-DUR,KLOR-CON) 10 MEQ tablet    Sig: Take 10 mEq by mouth 2 (two) times daily.  . Vitamin D, Ergocalciferol, (DRISDOL) 50000 UNITS CAPS    Sig: Take 50,000 Units by mouth.  Marland Kitchen amoxicillin-clavulanate (AUGMENTIN) 875-125 MG per tablet    Sig: Take 1 tablet by mouth 2 (two) times daily.    Dispense:  20 tablet    Refill:  0  . fluticasone (FLONASE) 50 MCG/ACT  nasal spray    Sig: Place 2 sprays into the nose daily.    Dispense:  16 g    Refill:  6  . chlorpheniramine-HYDROcodone (TUSSIONEX PENNKINETIC ER) 10-8 MG/5ML LQCR    Sig: Take 5 mLs by mouth at bedtime as needed. As needed for cough    Dispense:  180 mL    Refill:  0

## 2013-02-25 ENCOUNTER — Ambulatory Visit
Admission: RE | Admit: 2013-02-25 | Discharge: 2013-02-25 | Disposition: A | Discharge status: Home / Self Care | Payer: Self-pay | Source: Ambulatory Visit | Attending: Internal Medicine | Admitting: Internal Medicine

## 2013-02-25 DIAGNOSIS — Z1231 Encounter for screening mammogram for malignant neoplasm of breast: Secondary | ICD-10-CM

## 2013-03-02 ENCOUNTER — Other Ambulatory Visit: Payer: Self-pay | Admitting: Internal Medicine

## 2013-03-02 DIAGNOSIS — R928 Other abnormal and inconclusive findings on diagnostic imaging of breast: Secondary | ICD-10-CM

## 2013-03-12 ENCOUNTER — Other Ambulatory Visit: Payer: Self-pay | Admitting: Internal Medicine

## 2013-03-12 ENCOUNTER — Ambulatory Visit
Admission: RE | Admit: 2013-03-12 | Discharge: 2013-03-12 | Disposition: A | Discharge status: Home / Self Care | Payer: 59 | Source: Ambulatory Visit | Attending: Internal Medicine | Admitting: Internal Medicine

## 2013-03-12 DIAGNOSIS — R928 Other abnormal and inconclusive findings on diagnostic imaging of breast: Secondary | ICD-10-CM

## 2013-03-16 ENCOUNTER — Other Ambulatory Visit: Payer: 59

## 2013-03-19 ENCOUNTER — Ambulatory Visit
Admission: RE | Admit: 2013-03-19 | Discharge: 2013-03-19 | Disposition: A | Discharge status: Home / Self Care | Payer: 59 | Source: Ambulatory Visit | Attending: Internal Medicine | Admitting: Internal Medicine

## 2013-03-19 ENCOUNTER — Other Ambulatory Visit: Payer: Self-pay | Admitting: Internal Medicine

## 2013-03-19 DIAGNOSIS — R928 Other abnormal and inconclusive findings on diagnostic imaging of breast: Secondary | ICD-10-CM

## 2013-04-09 ENCOUNTER — Ambulatory Visit (INDEPENDENT_AMBULATORY_CARE_PROVIDER_SITE_OTHER): Payer: 59 | Admitting: Internal Medicine

## 2013-04-09 VITALS — BP 126/94 | HR 81 | Temp 97.9°F | Resp 16 | Ht 65.5 in | Wt 252.0 lb

## 2013-04-09 DIAGNOSIS — M5430 Sciatica, unspecified side: Secondary | ICD-10-CM

## 2013-04-09 DIAGNOSIS — J329 Chronic sinusitis, unspecified: Secondary | ICD-10-CM

## 2013-04-09 MED ORDER — TRAMADOL HCL 50 MG PO TABS: 50.0000 mg | ORAL_TABLET | Freq: Three times a day (TID) | ORAL | Status: DC | PRN

## 2013-04-09 MED ORDER — AMOXICILLIN 500 MG PO CAPS: 1000.0000 mg | ORAL_CAPSULE | Freq: Two times a day (BID) | ORAL | Status: DC

## 2013-04-09 NOTE — Patient Instructions (Signed)
Sciatica Sciatica is pain, weakness, numbness, or tingling along the path of the sciatic nerve. The nerve starts in the lower back and runs down the back of each leg. The nerve controls the muscles in the lower leg and in the back of the knee, while also providing sensation to the back of the thigh, lower leg, and the sole of your foot. Sciatica is a symptom of another medical condition. For instance, nerve damage or certain conditions, such as a herniated disk or bone spur on the spine, pinch or put pressure on the sciatic nerve. This causes the pain, weakness, or other sensations normally associated with sciatica. Generally, sciatica only affects one side of the body. CAUSES   Herniated or slipped disc.  Degenerative disk disease.  A pain disorder involving the narrow muscle in the buttocks (piriformis syndrome).  Pelvic injury or fracture.  Pregnancy.  Tumor (rare). SYMPTOMS  Symptoms can vary from mild to very severe. The symptoms usually travel from the low back to the buttocks and down the back of the leg. Symptoms can include:  Mild tingling or dull aches in the lower back, leg, or hip.  Numbness in the back of the calf or sole of the foot.  Burning sensations in the lower back, leg, or hip.  Sharp pains in the lower back, leg, or hip.  Leg weakness.  Severe back pain inhibiting movement. These symptoms may get worse with coughing, sneezing, laughing, or prolonged sitting or standing. Also, being overweight may worsen symptoms. DIAGNOSIS  Your caregiver will perform a physical exam to look for common symptoms of sciatica. He or she may ask you to do certain movements or activities that would trigger sciatic nerve pain. Other tests may be performed to find the cause of the sciatica. These may include:  Blood tests.  X-rays.  Imaging tests, such as an MRI or CT scan. TREATMENT  Treatment is directed at the cause of the sciatic pain. Sometimes, treatment is not necessary  and the pain and discomfort goes away on its own. If treatment is needed, your caregiver may suggest:  Over-the-counter medicines to relieve pain.  Prescription medicines, such as anti-inflammatory medicine, muscle relaxants, or narcotics.  Applying heat or ice to the painful area.  Steroid injections to lessen pain, irritation, and inflammation around the nerve.  Reducing activity during periods of pain.  Exercising and stretching to strengthen your abdomen and improve flexibility of your spine. Your caregiver may suggest losing weight if the extra weight makes the back pain worse.  Physical therapy.  Surgery to eliminate what is pressing or pinching the nerve, such as a bone spur or part of a herniated disk. HOME CARE INSTRUCTIONS   Only take over-the-counter or prescription medicines for pain or discomfort as directed by your caregiver.  Apply ice to the affected area for 20 minutes, 3 4 times a day for the first 48 72 hours. Then try heat in the same way.  Exercise, stretch, or perform your usual activities if these do not aggravate your pain.  Attend physical therapy sessions as directed by your caregiver.  Keep all follow-up appointments as directed by your caregiver.  Do not wear high heels or shoes that do not provide proper support.  Check your mattress to see if it is too soft. A firm mattress may lessen your pain and discomfort. SEEK IMMEDIATE MEDICAL CARE IF:   You lose control of your bowel or bladder (incontinence).  You have increasing weakness in the lower back,   pelvis, buttocks, or legs.  You have redness or swelling of your back.  You have a burning sensation when you urinate.  You have pain that gets worse when you lie down or awakens you at night.  Your pain is worse than you have experienced in the past.  Your pain is lasting longer than 4 weeks.  You are suddenly losing weight without reason. MAKE SURE YOU:  Understand these  instructions.  Will watch your condition.  Will get help right away if you are not doing well or get worse. Document Released: 01/08/2001 Document Revised: 07/16/2011 Document Reviewed: 05/26/2011 [facility] Patient Information 2014 [place]. Sinusitis Sinusitis is redness, soreness, and swelling (inflammation) of the paranasal sinuses. Paranasal sinuses are air pockets within the bones of your face (beneath the eyes, the middle of the forehead, or above the eyes). In healthy paranasal sinuses, mucus is able to drain out, and air is able to circulate through them by way of your nose. However, when your paranasal sinuses are inflamed, mucus and air can become trapped. This can allow bacteria and other germs to grow and cause infection. Sinusitis can develop quickly and last only a short time (acute) or continue over a long period (chronic). Sinusitis that lasts for more than 12 weeks is considered chronic.  CAUSES  Causes of sinusitis include:  Allergies.  Structural abnormalities, such as displacement of the cartilage that separates your nostrils (deviated septum), which can decrease the air flow through your nose and sinuses and affect sinus drainage.  Functional abnormalities, such as when the small hairs (cilia) that line your sinuses and help remove mucus do not work properly or are not present. SYMPTOMS  Symptoms of acute and chronic sinusitis are the same. The primary symptoms are pain and pressure around the affected sinuses. Other symptoms include:  Upper toothache.  Earache.  Headache.  Bad breath.  Decreased sense of smell and taste.  A cough, which worsens when you are lying flat.  Fatigue.  Fever.  Thick drainage from your nose, which often is green and may contain pus (purulent).  Swelling and warmth over the affected sinuses. DIAGNOSIS  Your caregiver will perform a physical exam. During the exam, your caregiver may:  Look in your nose for signs of  abnormal growths in your nostrils (nasal polyps).  Tap over the affected sinus to check for signs of infection.  View the inside of your sinuses (endoscopy) with a special imaging device with a light attached (endoscope), which is inserted into your sinuses. If your caregiver suspects that you have chronic sinusitis, one or more of the following tests may be recommended:  Allergy tests.  Nasal culture A sample of mucus is taken from your nose and sent to a lab and screened for bacteria.  Nasal cytology A sample of mucus is taken from your nose and examined by your caregiver to determine if your sinusitis is related to an allergy. TREATMENT  Most cases of acute sinusitis are related to a viral infection and will resolve on their own within 10 days. Sometimes medicines are prescribed to help relieve symptoms (pain medicine, decongestants, nasal steroid sprays, or saline sprays).  However, for sinusitis related to a bacterial infection, your caregiver will prescribe antibiotic medicines. These are medicines that will help kill the bacteria causing the infection.  Rarely, sinusitis is caused by a fungal infection. In theses cases, your caregiver will prescribe antifungal medicine. For some cases of chronic sinusitis, surgery is needed. Generally, these are cases  in which sinusitis recurs more than 3 times per year, despite other treatments. HOME CARE INSTRUCTIONS   Drink plenty of water. Water helps thin the mucus so your sinuses can drain more easily.  Use a humidifier.  Inhale steam 3 to 4 times a day (for example, sit in the bathroom with the shower running).  Apply a warm, moist washcloth to your face 3 to 4 times a day, or as directed by your caregiver.  Use saline nasal sprays to help moisten and clean your sinuses.  Take over-the-counter or prescription medicines for pain, discomfort, or fever only as directed by your caregiver. SEEK IMMEDIATE MEDICAL CARE IF:  You have increasing  pain or severe headaches.  You have nausea, vomiting, or drowsiness.  You have swelling around your face.  You have vision problems.  You have a stiff neck.  You have difficulty breathing. MAKE SURE YOU:   Understand these instructions.  Will watch your condition.  Will get help right away if you are not doing well or get worse. Document Released: 01/14/2005 Document Revised: 04/08/2011 Document Reviewed: 01/29/2011 [facility] Patient Information 2014 [place], [place].

## 2013-04-09 NOTE — Progress Notes (Signed)
   Subjective:    Patient ID: Donna [name], female    DOB: 12-04-1955, 58 y.o.   MRN: [id number]  HPIPatient presents with 2 week history of sinus pain and pressure, headache, left eye pain, and left ear pain.  Also, patient complaining of right leg pain.  Patient has a history of sciatic nerve pain. Takes tramadol for pain no side effects. No new sxs or trauma to back. NMSV intact altho pain to right ankle.  Review of Systems     Objective:   Physical Exam  Constitutional: She is oriented to person, place, and time. She appears well-developed and well-nourished.  HENT:  Head: Normocephalic.  Right Ear: External ear normal.  Left Ear: External ear normal.  Nose: Mucosal edema, rhinorrhea and sinus tenderness present. Right sinus exhibits no maxillary sinus tenderness and no frontal sinus tenderness. Left sinus exhibits maxillary sinus tenderness and frontal sinus tenderness.  Mouth/Throat: Oropharynx is clear and moist.  Eyes: EOM are normal.  Neck: Normal range of motion.  Cardiovascular: Normal rate and normal heart sounds.   Pulmonary/Chest: Effort normal and breath sounds normal.  Musculoskeletal:       Lumbar back: She exhibits decreased range of motion, tenderness, bony tenderness, pain and spasm. She exhibits no swelling, no edema, no deformity, no laceration and normal pulse.  Lymphadenopathy:    She has no cervical adenopathy.  Neurological: She is alert and oriented to person, place, and time. She has normal strength and normal reflexes. No cranial nerve deficit or sensory deficit. She exhibits normal muscle tone. She displays a negative Romberg sign. Coordination and gait normal.          Assessment & Plan:  Back care and Tramadol Sinus care and amoxil

## 2013-06-15 ENCOUNTER — Ambulatory Visit (INDEPENDENT_AMBULATORY_CARE_PROVIDER_SITE_OTHER): Payer: 59 | Admitting: Physician Assistant

## 2013-06-15 VITALS — BP 132/66 | HR 78 | Temp 98.1°F | Resp 16 | Ht 64.5 in | Wt 252.4 lb

## 2013-06-15 DIAGNOSIS — R062 Wheezing: Secondary | ICD-10-CM

## 2013-06-15 DIAGNOSIS — R5381 Other malaise: Secondary | ICD-10-CM

## 2013-06-15 DIAGNOSIS — R0981 Nasal congestion: Secondary | ICD-10-CM

## 2013-06-15 DIAGNOSIS — J019 Acute sinusitis, unspecified: Secondary | ICD-10-CM

## 2013-06-15 DIAGNOSIS — R05 Cough: Secondary | ICD-10-CM

## 2013-06-15 DIAGNOSIS — R5383 Other fatigue: Secondary | ICD-10-CM

## 2013-06-15 DIAGNOSIS — R059 Cough, unspecified: Secondary | ICD-10-CM

## 2013-06-15 LAB — POCT CBC
Granulocyte percent: 60.3 %G (ref 37–80)
HCT, POC: 44 % (ref 37.7–47.9)
Hemoglobin: 14.7 g/dL (ref 12.2–16.2)
LYMPH, POC: 2.3 (ref 0.6–3.4)
MCH: 29.4 pg (ref 27–31.2)
MCHC: 33.4 g/dL (ref 31.8–35.4)
MCV: 88.1 fL (ref 80–97)
MID (CBC): 0.5 (ref 0–0.9)
MPV: 8.3 fL (ref 0–99.8)
PLATELET COUNT, POC: 344 10*3/uL (ref 142–424)
POC Granulocyte: 4.2 (ref 2–6.9)
POC LYMPH PERCENT: 32.4 %L (ref 10–50)
POC MID %: 7.3 % (ref 0–12)
RBC: 5 M/uL (ref 4.04–5.48)
RDW, POC: 13.9 %
WBC: 7 10*3/uL (ref 4.6–10.2)

## 2013-06-15 MED ORDER — E-Z SPACER DEVI: Status: DC

## 2013-06-15 MED ORDER — IPRATROPIUM BROMIDE 0.06 % NA SOLN: 2.0000 | Freq: Three times a day (TID) | NASAL | Status: DC

## 2013-06-15 MED ORDER — IPRATROPIUM BROMIDE 0.02 % IN SOLN: 0.5000 mg | Freq: Once | RESPIRATORY_TRACT | Status: DC

## 2013-06-15 MED ORDER — HYDROCOD POLST-CHLORPHEN POLST 10-8 MG/5ML PO LQCR: 5.0000 mL | Freq: Two times a day (BID) | ORAL | Status: DC | PRN

## 2013-06-15 MED ORDER — AMOXICILLIN-POT CLAVULANATE 875-125 MG PO TABS: 1.0000 | ORAL_TABLET | Freq: Two times a day (BID) | ORAL | Status: DC

## 2013-06-15 MED ORDER — LEVALBUTEROL HCL 0.63 MG/3ML IN NEBU: 0.6300 mg | INHALATION_SOLUTION | Freq: Once | RESPIRATORY_TRACT | Status: DC

## 2013-06-15 MED ORDER — ALBUTEROL SULFATE HFA 108 (90 BASE) MCG/ACT IN AERS: 2.0000 | INHALATION_SPRAY | RESPIRATORY_TRACT | Status: DC | PRN

## 2013-06-15 NOTE — Progress Notes (Signed)
Subjective:    Patient ID: Donna [name], female    DOB: [birth year], 58 y.o.   MRN: [id number]  HPI Primary Physician: No PCP Per Patient  Chief Complaint: URI x 4 days  HPI: 58 y.o. female with history below presents with 4 day history of nasal congestion, rhinorrhea, cough, headache, sinus pressure, sore throat, fatigue, and myalgias. Afebrile. No chills. Sinus pressure is along the maxillary sinuses. Cough is productive of yellow mucus. Some SOB and wheezing.    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 Currently taking allergy shots. Sugars run around 150. PCP is [facility].    Past Medical History  Diagnosis Date  . Hypertension   . Hypothyroid   . Diverticulitis   . Arthritis   . Allergy   . Diabetes mellitus without complication      Home Meds: Prior to Admission medications   Medication Sig Start Date End Date Taking? Authorizing Provider  cetirizine (ZYRTEC) 10 MG tablet Take 10 mg by mouth daily.   Yes Historical Provider, MD  ibuprofen (ADVIL,MOTRIN) 800 MG tablet Take 800 mg by mouth every 8 (eight) hours as needed. For pain   Yes Historical Provider, MD  levothyroxine (SYNTHROID, LEVOTHROID) 25 MCG tablet Take 1 tablet (25 mcg total) by mouth daily. 12/07/11  Yes Kathalene Frames, MD  losartan-hydrochlorothiazide (HYZAAR) 50-12.5 MG per tablet Take 1 tablet by mouth daily.   Yes Historical Provider, MD  metFORMIN (GLUCOPHAGE) 500 MG tablet Take 500 mg by mouth 2 (two) times daily with a meal.   Yes Historical Provider, MD  metoprolol (LOPRESSOR) 50 MG tablet Take 1 tablet (50 mg total) by mouth 2 (two) times daily. 12/07/11  Yes Kathalene Frames, MD  pantoprazole (PROTONIX) 40 MG tablet Take 40 mg by mouth daily.   Yes Historical Provider, MD  potassium chloride (K-DUR,KLOR-CON) 10 MEQ tablet Take 10 mEq by mouth 2 (two) times daily.   Yes Historical Provider, MD  traMADol (ULTRAM) 50 MG tablet Take 1 tablet (50 mg total) by mouth every 8 (eight) hours as needed. For pain 04/09/13  Yes Orma Flaming, MD    Vitamin D, Ergocalciferol, (DRISDOL) 50000 UNITS CAPS Take 50,000 Units by mouth.   Yes Historical Provider, MD                                Allergies:  Allergies  Allergen Reactions  . Glimepiride Other (See Comments)    Per patient causes severe gas pain  . Morphine And Related   . Colcrys [Colchicine] Rash  . Lisinopril Rash    History   Social History  . Marital Status: Single    Spouse Name: N/A    Number of Children: N/A  . Years of Education: N/A   Occupational History  . Not on file.   Social History Main Topics  . Smoking status: Former Research scientist (life sciences)  . Smokeless tobacco: Not on file  . Alcohol Use: Yes  . Drug Use: No  . Sexual Activity: No   Other Topics Concern  . Not on file   Social History Narrative  . No narrative on file     Review of Systems  Constitutional: Positive for fatigue. Negative for fever and chills.  HENT: Positive for congestion, rhinorrhea, sinus pressure and sore throat. Negative for ear pain, hearing loss and postnasal drip.        Nasal congestion.  Maxillary sinus pressure.  Mild sore throat along the right  side.   Eyes: Positive for discharge.       Watery.   Respiratory: Positive for cough, shortness of breath and wheezing.   Cardiovascular: Negative for chest pain, palpitations and leg swelling.  Gastrointestinal: Positive for diarrhea. Negative for nausea and vomiting.  Musculoskeletal: Positive for myalgias.  Neurological: Positive for headaches.       Objective:   Physical Exam  Physical Exam: Blood pressure 132/66, pulse 78, temperature 98.1 F (36.7 C), temperature source Oral, resp. rate 16, height 5' 4.5" (1.638 m), weight 252 lb 6.4 oz (114.488 kg), SpO2 96.00%., Body mass index is 42.67 kg/(m^2). General: Well developed, well nourished, in no acute distress. Head: Normocephalic, atraumatic, eyes without discharge, sclera non-icteric, nares are congested. Bilateral auditory canals clear, TM's are without  perforation, pearly grey and translucent with reflective cone of light bilaterally. Oral cavity moist, posterior pharynx with post nasal drip. No exudate, erythema, or peritonsillar abscess. Uvula midline.  Neck: Supeple. No thyromegaly. Full ROM. No lymphadenopathy. Lungs: Clear bilaterally to auscultation without wheezes, rales, or rhonchi. Breathing is unlabored. Status post Xopenex and Atrovent nebulizer: Patient feels much better. Lungs now CTAB.  Heart: RRR with S1 S2. No murmurs, rubs, or gallops appreciated. Msk:  Strength and tone normal for age. Extremities/Skin: Warm and dry. No clubbing or cyanosis. No edema. No rashes or suspicious lesions. Neuro: Alert and oriented X 3. Moves all extremities spontaneously. Gait is normal. CNII-XII grossly in tact. Psych:  Responds to questions appropriately with a normal affect.    Labs: Results for orders placed in visit on 06/15/13  POCT CBC      Result Value Ref Range   WBC 7.0  4.6 - 10.2 K/uL   Lymph, poc 2.3  0.6 - 3.4   POC LYMPH PERCENT 32.4  10 - 50 %L   MID (cbc) 0.5  0 - 0.9   POC MID % 7.3  0 - 12 %M   POC Granulocyte 4.2  2 - 6.9   Granulocyte percent 60.3  37 - 80 %G   RBC 5.00  4.04 - 5.48 M/uL   Hemoglobin 14.7  12.2 - 16.2 g/dL   HCT, POC 44.0  37.7 - 47.9 %   MCV 88.1  80 - 97 fL   MCH, POC 29.4  27 - 31.2 pg   MCHC 33.4  31.8 - 35.4 g/dL   RDW, POC 13.9     Platelet Count, POC 344  142 - 424 K/uL   MPV 8.3  0 - 99.8 fL       Assessment & Plan:  58 year old female with sinusitis, cough, and wheezing -Augmentin 875/125 mg 1 po bid #20 no RF -Proventil 2 puffs inhaled q 4-6 hours prn #1 RF 1 -Tussionex 1 tsp po q 12 hours prn cough #90 mL no RF, SED, patient states she tolerates  -Spacer -Atrovent NS 0.06% 2 sprays each nare bid prn #1 no RF   Christell Faith, MHS, PA-C [facility] [place], [place] [address] [facility] 06/15/2013 1:37 PM

## 2013-06-28 ENCOUNTER — Telehealth: Payer: Self-pay | Admitting: Physician Assistant

## 2013-06-28 NOTE — Telephone Encounter (Signed)
Placed FMLA ppw in Bascom Dunn's box for completion on 06/28/2013. Patient states she needs this for time missed due to chronic bronchitis

## 2013-07-14 ENCOUNTER — Encounter: Payer: Self-pay | Admitting: Physician Assistant

## 2013-07-15 ENCOUNTER — Ambulatory Visit (INDEPENDENT_AMBULATORY_CARE_PROVIDER_SITE_OTHER): Payer: 59 | Admitting: Family Medicine

## 2013-07-15 VITALS — BP 140/82 | HR 75 | Temp 97.8°F | Resp 18 | Ht 65.0 in | Wt 258.2 lb

## 2013-07-15 DIAGNOSIS — H60509 Unspecified acute noninfective otitis externa, unspecified ear: Secondary | ICD-10-CM

## 2013-07-15 DIAGNOSIS — E119 Type 2 diabetes mellitus without complications: Secondary | ICD-10-CM

## 2013-07-15 DIAGNOSIS — H60543 Acute eczematoid otitis externa, bilateral: Secondary | ICD-10-CM

## 2013-07-15 DIAGNOSIS — E559 Vitamin D deficiency, unspecified: Secondary | ICD-10-CM

## 2013-07-15 DIAGNOSIS — J42 Unspecified chronic bronchitis: Secondary | ICD-10-CM

## 2013-07-15 DIAGNOSIS — I10 Essential (primary) hypertension: Secondary | ICD-10-CM

## 2013-07-15 DIAGNOSIS — L500 Allergic urticaria: Secondary | ICD-10-CM

## 2013-07-15 DIAGNOSIS — Z9109 Other allergy status, other than to drugs and biological substances: Secondary | ICD-10-CM

## 2013-07-15 MED ORDER — LEVOCETIRIZINE DIHYDROCHLORIDE 5 MG PO TABS: 5.0000 mg | ORAL_TABLET | Freq: Every evening | ORAL | Status: DC

## 2013-07-15 MED ORDER — METHYLPREDNISOLONE ACETATE 80 MG/ML IJ SUSP
80.0000 mg | Freq: Once | INTRAMUSCULAR | Status: AC
  Administered 2013-07-15: 80 mg via INTRAMUSCULAR

## 2013-07-15 MED ORDER — HYDROXYZINE HCL 25 MG PO TABS: 12.5000 mg | ORAL_TABLET | Freq: Three times a day (TID) | ORAL | Status: DC | PRN

## 2013-07-15 MED ORDER — FLUOCINOLONE ACETONIDE 0.01 % OT OIL: 5.0000 [drp] | TOPICAL_OIL | Freq: Two times a day (BID) | OTIC | Status: DC

## 2013-07-15 MED ORDER — RANITIDINE HCL 150 MG PO TABS: 150.0000 mg | ORAL_TABLET | Freq: Two times a day (BID) | ORAL | Status: DC

## 2013-07-15 NOTE — Progress Notes (Addendum)
Subjective:    Patient ID: Donna [name], female    DOB: 05-30-1955, 58 y.o.   MRN: [id number]  This chart was scribed for Laurey Arrow. Brigitte Pulse, MD by Steva Colder, ED Scribe. The patient was seen in room 4 at 9:40 AM.   Chief Complaint  Patient presents with  . Rash    Itching rashes all over body and around eyes, Eyes are swollen, mouth is tingling, x2 days    HPI  Donna [name] is a 58 y.o. female who presents today complaining of a rash onset yesterdays.  Noticed her neck becoming red and itchy last night - thought maybe it was a mosquito bite but when she woke this  Morning her right eye was swollen shut and she also had a rash in her inner upper thighs. Today the right eye and gone down a little but left eye has started to swell and she had developed rash in the axilla and waist line as well.  She states that she tried  2 tablespoons of liquid benadryl with no relief for her symptoms. She states that she has an epi-pen and she did not want to use that.   Can't think of anything new that she could be allergic to. Has been trying to eat more salads and lettuce recently.  She didn't have any shellfish, nuts or fruits. She states that she has not used any new detergents, perfume, lotions or bath soaps. No new medicines or supplements.  She states that she has no new pets or bedding. She states that the only recent new medicine is Auralgan ear drops for ear itching. She states that she has not had any juices to drink lately.   She states that she has a h/o environmental allergies for which she has always taken zyrtec-D and flonase (ran out of both 4d prev). She had skin allergy testing at the [facility] and was told that she HAD to start self-administering allergy shots 3 times a week.  She states that they diagnosed her as being allergic to two kinds of grass and dogs. She thought that that was weird because she had a rottweiler for 9 years that lived indoors with her w/o problems.  She did not want  to start on allergy shots and recently stopped them - refused to take them - even though the doctor told her she HAD to. Therefore, is not going back to [facility] and would like to start coming here for PCP instead.  She would like something oral to take regularly for allergies that is stronger than otc claritin or zyrtec.  She states that she used to smoke but denies h/o COPD. She states that she has bronchitis at least 3 times a year. She states that she gets the "wheezes" ever since she was young - esp in response to cold air or cold food. She states that she does not have an nebulizer at home but keeps an albuterol inhaler around in case of acute illness.  She was given a spacer but it does not fit with the inhaler and she feels she does not need a spacer - has been using an inhaler her whole life.   She states that she has DM and checks her blood sugar once a day in the afternoon. She states that yesterday her blood sugar was 169.   She states that she is taken her potassium once a day as she gets a lot of muscle cramping w/o it.  Past Medical History  Diagnosis Date  . Hypertension   . Hypothyroid   . Diverticulitis   . Arthritis   . Allergy   . Diabetes mellitus without complication     Current Outpatient Prescriptions on File Prior to Visit  Medication Sig Dispense Refill  . albuterol (PROVENTIL HFA;VENTOLIN HFA) 108 (90 BASE) MCG/ACT inhaler Inhale 2 puffs into the lungs every 4 (four) hours as needed for wheezing or shortness of breath.  1 Inhaler  1  . cetirizine (ZYRTEC) 10 MG tablet Take 10 mg by mouth daily.      Marland Kitchen ibuprofen (ADVIL,MOTRIN) 800 MG tablet Take 800 mg by mouth every 8 (eight) hours as needed. For pain      . ipratropium (ATROVENT) 0.06 % nasal spray Place 2 sprays into the nose 3 (three) times daily.  15 mL  0  . levothyroxine (SYNTHROID, LEVOTHROID) 25 MCG tablet Take 1 tablet (25 mcg total) by mouth daily.  30 tablet  0  . losartan-hydrochlorothiazide  (HYZAAR) 50-12.5 MG per tablet Take 1 tablet by mouth daily.      . metFORMIN (GLUCOPHAGE) 500 MG tablet Take 500 mg by mouth 2 (two) times daily with a meal.      . metoprolol (LOPRESSOR) 50 MG tablet Take 1 tablet (50 mg total) by mouth 2 (two) times daily.  60 tablet  0  . pantoprazole (PROTONIX) 40 MG tablet Take 40 mg by mouth daily.      . potassium chloride (K-DUR,KLOR-CON) 10 MEQ tablet Take 10 mEq by mouth 2 (two) times daily.      Marland Kitchen Spacer/Aero-Holding Chambers (E-Z SPACER) inhaler Use as instructed  1 each  2  . traMADol (ULTRAM) 50 MG tablet Take 1 tablet (50 mg total) by mouth every 8 (eight) hours as needed. For pain  30 tablet  0  . Vitamin D, Ergocalciferol, (DRISDOL) 50000 UNITS CAPS Take 50,000 Units by mouth.      Marland Kitchen amoxicillin-clavulanate (AUGMENTIN) 875-125 MG per tablet Take 1 tablet by mouth 2 (two) times daily.  20 tablet  0  . chlorpheniramine-HYDROcodone (TUSSIONEX PENNKINETIC ER) 10-8 MG/5ML LQCR Take 5 mLs by mouth every 12 (twelve) hours as needed for cough.  90 mL  0   Current Facility-Administered Medications on File Prior to Visit  Medication Dose Route Frequency Provider Last Rate Last Dose  . ipratropium (ATROVENT) nebulizer solution 0.5 mg  0.5 mg Nebulization Once Ryan M Dunn, PA-C      . levalbuterol [facility]) nebulizer solution 0.63 mg  0.63 mg Nebulization Once Rise Mu, PA-C        Allergies  Allergen Reactions  . Glimepiride Other (See Comments)    Per patient causes severe gas pain  . Morphine And Related   . Colcrys [Colchicine] Rash  . Lisinopril Rash     Review of Systems  Constitutional: Negative for fever, chills and diaphoresis.  HENT: Negative for congestion, drooling, trouble swallowing and voice change.   Eyes: Negative for discharge, redness and itching.  Respiratory: Negative for cough, chest tightness and shortness of breath.   Cardiovascular: Negative for chest pain, palpitations and leg swelling.  Gastrointestinal: Negative  for nausea, vomiting, abdominal pain, diarrhea and constipation.  Musculoskeletal: Negative for arthralgias, joint swelling, myalgias, neck pain and neck stiffness.  Skin: Positive for color change and rash (spread to neck, chest, legs, and arms.). Negative for pallor and wound.  Allergic/Immunologic: Positive for environmental allergies. Negative for food allergies and immunocompromised state.  Neurological: Negative  for dizziness, syncope, weakness and light-headedness.  Hematological: Negative for adenopathy. Does not bruise/bleed easily.  Psychiatric/Behavioral: Negative for sleep disturbance. The patient is not nervous/anxious.        BP 140/82  Pulse 75  Temp(Src) 97.8 F (36.6 C) (Oral)  Resp 18  Ht 5\' 5"  (1.651 m)  Wt 258 lb 3.2 oz (117.119 kg)  BMI 42.97 kg/m2  SpO2 94%  Objective:   Physical Exam  Nursing note and vitals reviewed. Constitutional: She is oriented to person, place, and time. She appears well-developed and well-nourished. No distress.  HENT:  Head: Normocephalic and atraumatic.  Right Ear: Tympanic membrane is injected. A middle ear effusion is present.  Left Ear: Tympanic membrane is injected. A middle ear effusion is present.  Mouth/Throat: Oropharynx is clear and moist. No posterior oropharyngeal erythema.  L and R TM with injection ejection and effusion. Bilaterally. Exudate in the R canal.  Eyes: EOM are normal.  Neck: Neck supple. No tracheal deviation present.  No thyroid present. Thyroid was removed by Dr. Johney Maine.   Cardiovascular: Normal rate and regular rhythm.   Heart sounds distant.   Pulmonary/Chest: Effort normal and breath sounds normal. No respiratory distress.  Musculoskeletal: Normal range of motion.  Neurological: She is alert and oriented to person, place, and time.  Skin: Skin is warm and dry. Rash noted.  Blanching macular eyrthema confluent spreading out to pinpoint. Poorly defined spread over neck, chest, upper thighs, pant line,  axillary, and eyelids. Right eyelid is worse than the left.   Psychiatric: She has a normal mood and affect. Her behavior is normal.       Assessment & Plan:   COORDINATION OF CARE: 10:12 AM-Discussed treatment plan   Allergic urticaria - Plan: methylPREDNISolone acetate (DEPO-MEDROL) injection 80 mg - Increase Metformin twice a day for the next 2-4 days and can go back to prior dose once sugar is under 150. Hydroxyzine as needed and Benadryl at night if needed.  Start xyzal and zantac daily for at least 2 wks  Eczematoid otitis externa of both ears - Stop using Auralgan and place on a steroid ear drop and place on a prescription allergen with pt at bedside and pt agreed to plan. The fluocinolone ear drops should be taken as 5 drops each ear for a week then stop. Can restart if sxs recur.  Diabetes  Essential hypertension, benign  Environmental allergies - has failed Claritin and zyrtec. Stopping allergy shots as didn't help. Try xyzal.  Unspecified chronic bronchitis  Unspecified vitamin D deficiency  Meds ordered this encounter  Medications  . DISCONTD: antipyrine-benzocaine Toniann Fail) otic solution    Sig: Place 2 drops into both ears every 2 (two) hours as needed (itchy ears).  . Fluocinolone Acetonide 0.01 % OIL    Sig: Place 5 drops in ear(s) 2 (two) times daily. X 1 wk    Dispense:  20 mL    Refill:  1  . levocetirizine (XYZAL) 5 MG tablet    Sig: Take 1 tablet (5 mg total) by mouth every evening.    Dispense:  30 tablet    Refill:  2  . hydrOXYzine (ATARAX/VISTARIL) 25 MG tablet    Sig: Take 0.5-1 tablets (12.5-25 mg total) by mouth every 8 (eight) hours as needed for itching.    Dispense:  30 tablet    Refill:  2  . ranitidine (ZANTAC) 150 MG tablet    Sig: Take 1 tablet (150 mg total) by mouth 2 (two) times  daily.    Dispense:  60 tablet    Refill:  1  . methylPREDNISolone acetate (DEPO-MEDROL) injection 80 mg    Sig:    Sched f/u OV to review chronic medical  conditions and est PCP here at [facility].  I personally performed the services described in this documentation, which was scribed in my presence. The recorded information has been reviewed and considered, and addended by me as needed.  [name], MD MPH

## 2013-07-15 NOTE — Patient Instructions (Addendum)
Hives Hives are itchy, red, swollen areas of the skin. They can vary in size and location on your body. Hives can come and go for hours or several days (acute hives) or for several weeks (chronic hives). Hives do not spread from person to person (noncontagious). They may get worse with scratching, exercise, and emotional stress. CAUSES   Allergic reaction to food, additives, or drugs.  Infections, including the common cold.  Illness, such as vasculitis, lupus, or thyroid disease.  Exposure to sunlight, heat, or cold.  Exercise.  Stress.  Contact with chemicals. SYMPTOMS   Red or white swollen patches on the skin. The patches may change size, shape, and location quickly and repeatedly.  Itching.  Swelling of the hands, feet, and face. This may occur if hives develop deeper in the skin. DIAGNOSIS  Your caregiver can usually tell what is wrong by performing a physical exam. Skin or blood tests may also be done to determine the cause of your hives. In some cases, the cause cannot be determined. TREATMENT  Mild cases usually get better with medicines such as antihistamines. Severe cases may require an emergency epinephrine injection. If the cause of your hives is known, treatment includes avoiding that trigger.  HOME CARE INSTRUCTIONS   Avoid causes that trigger your hives.  Take antihistamines as directed by your caregiver to reduce the severity of your hives. Non-sedating or low-sedating antihistamines are usually recommended. Do not drive while taking an antihistamine.  Take any other medicines prescribed for itching as directed by your caregiver.  Wear loose-fitting clothing.  Keep all follow-up appointments as directed by your caregiver. SEEK MEDICAL CARE IF:   You have persistent or severe itching that is not relieved with medicine.  You have painful or swollen joints. SEEK IMMEDIATE MEDICAL CARE IF:   You have a fever.  Your tongue or lips are swollen.  You have  trouble breathing or swallowing.  You feel tightness in the throat or chest.  You have abdominal pain. These problems may be the first sign of a life-threatening allergic reaction. Call your local emergency services (911 in [place] MAKE SURE YOU:   Understand these instructions.  Will watch your condition.  Will get help right away if you are not doing well or get worse. Document Released: 01/14/2005 Document Revised: 01/19/2013 Document Reviewed: 04/09/2011 [facility] Patient Information 2015 [place], [place]. This information is not intended to replace advice given to you by your health care Donna [name]. Make sure you discuss any questions you have with your health care Donna [name].   Potassium Content of Foods Potassium is a mineral found in many foods and drinks. It helps keep fluids and minerals balanced in your body and affects how steadily your heart beats. Potassium also helps control your blood pressure and keep your muscles and nervous system healthy. Certain health conditions and medicines may change the balance of potassium in your body. When this happens, you can help balance your level of potassium through the foods that you do or do not eat. Your health care Donna [name] or dietitian may recommend an amount of potassium that you should have each day. The following lists of foods provide the amount of potassium (in parentheses) per serving in each item. HIGH IN POTASSIUM  The following foods and beverages have 200 mg or more of potassium per serving:  Apricots, 2 raw or 5 dry (200 mg).  Artichoke, 1 medium (345 mg).  Avocado, raw,  each (245 mg).  Banana, 1 medium (425 mg).  Beans, lima, or baked beans, canned,  cup (280 mg).  Beans, white, canned,  cup (595 mg).  Beef roast, 3 oz (320 mg).  Beef, ground, 3 oz (270 mg).  Beets, raw or cooked,  cup (260 mg).  Bran muffin, 2 oz (300 mg).  Broccoli,  cup (230 mg).  Brussels sprouts,  cup (250 mg).  Cantaloupe,  cup  (215 mg).  Cereal, 100% bran,  cup (200-400 mg).  Cheeseburger, single, fast food, 1 each (225-400 mg).  Chicken, 3 oz (220 mg).  Clams, canned, 3 oz (535 mg).  Crab, 3 oz (225 mg).  Dates, 5 each (270 mg).  Dried beans and peas,  cup (300-475 mg).  Figs, dried, 2 each (260 mg).  Fish: halibut, tuna, cod, snapper, 3 oz (480 mg).  Fish: salmon, haddock, swordfish, perch, 3 oz (300 mg).  Fish, tuna, canned 3 oz (200 mg).  [place] fries, fast food, 3 oz (470 mg).  Granola with fruit and nuts,  cup (200 mg).  Grapefruit juice,  cup (200 mg).  Greens, beet,  cup (655 mg).  Honeydew melon,  cup (200 mg).  Kale, raw, 1 cup (300 mg).  Kiwi, 1 medium (240 mg).  Kohlrabi, rutabaga, parsnips,  cup (280 mg).  Lentils,  cup (365 mg).  Mango, 1 each (325 mg).  Milk, chocolate, 1 cup (420 mg).  Milk: nonfat, low-fat, whole, buttermilk, 1 cup (350-380 mg).  Molasses, 1 Tbsp (295 mg).  Mushrooms,  cup (280) mg.  Nectarine, 1 each (275 mg).  Nuts: almonds, peanuts, hazelnuts, [place], cashew, mixed, 1 oz (200 mg).  Nuts, pistachios, 1 oz (295 mg).  Orange, 1 each (240 mg).  Orange juice,  cup (235 mg).  Papaya, medium,  fruit (390 mg).  Peanut butter, chunky, 2 Tbsp (240 mg).  Peanut butter, smooth, 2 Tbsp (210 mg).  Pear, 1 medium (200 mg).  Pomegranate, 1 whole (400 mg).  Pomegranate juice,  cup (215 mg).  Pork, 3 oz (350 mg).  Potato chips, salted, 1 oz (465 mg).  Potato, baked with skin, 1 medium (925 mg).  Potatoes, boiled,  cup (255 mg).  Potatoes, mashed,  cup (330 mg).  Prune juice,  cup (370 mg).  Prunes, 5 each (305 mg).  Pudding, chocolate,  cup (230 mg).  Pumpkin, canned,  cup (250 mg).  Raisins, seedless,  cup (270 mg).  Seeds, sunflower or pumpkin, 1 oz (240 mg).  Soy milk, 1 cup (300 mg).  Spinach,  cup (420 mg).  Spinach, canned,  cup (370 mg).  Sweet potato, baked with skin, 1 medium (450  mg).  Swiss chard,  cup (480 mg).  Tomato or vegetable juice,  cup (275 mg).  Tomato sauce or puree,  cup (400-550 mg).  Tomato, raw, 1 medium (290 mg).  Tomatoes, canned,  cup (200-300 mg).  [place], 3 oz (250 mg).  Wheat germ, 1 oz (250 mg).  Winter squash,  cup (250 mg).  Yogurt, plain or fruited, 6 oz (260-435 mg).  Zucchini,  cup (220 mg). MODERATE IN POTASSIUM The following foods and beverages have 50-200 mg of potassium per serving:  Apple, 1 each (150 mg).  Apple juice,  cup (150 mg).  Applesauce,  cup (90 mg).  Apricot nectar,  cup (140 mg).  Asparagus, small spears,  cup or 6 spears (155 mg).  Bagel, cinnamon raisin, 1 each (130 mg).  Bagel, egg or plain, 4 in., 1 each (70 mg).  Beans, green,  cup (90 mg).  Beans, yellow,  cup (190 mg).  Beer, regular, 12 oz (100 mg).  Beets, canned,  cup (125 mg).  Blackberries,  cup (115 mg).  Blueberries,  cup (60 mg).  Bread, whole wheat, 1 slice (70 mg).  Broccoli, raw,  cup (145 mg).  Cabbage,  cup (150 mg).  Carrots, cooked or raw,  cup (180 mg).  Cauliflower, raw,  cup (150 mg).  Celery, raw,  cup (155 mg).  Cereal, bran flakes, cup (120-150 mg).  Cheese, cottage,  cup (110 mg).  Cherries, 10 each (150 mg).  Chocolate, 1 oz bar (165 mg).  Coffee, brewed 6 oz (90 mg).  Corn,  cup or 1 ear (195 mg).  Cucumbers,  cup (80 mg).  Egg, large, 1 each (60 mg).  Eggplant,  cup (60 mg).  Endive, raw, cup (80 mg).  English muffin, 1 each (65 mg).  Fish, orange roughy, 3 oz (150 mg).  Frankfurter, beef or pork, 1 each (75 mg).  Fruit cocktail,  cup (115 mg).  Grape juice,  cup (170 mg).  Grapefruit,  fruit (175 mg).  Grapes,  cup (155 mg).  Greens: kale, turnip, collard,  cup (110-150 mg).  Ice cream or frozen yogurt, chocolate,  cup (175 mg).  Ice cream or frozen yogurt, vanilla,  cup (120-150 mg).  Lemons, limes, 1 each (80 mg).  Lettuce,  all types, 1 cup (100 mg).  Mixed vegetables,  cup (150 mg).  Mushrooms, raw,  cup (110 mg).  Nuts: walnuts, pecans, or macadamia, 1 oz (125 mg).  Oatmeal,  cup (80 mg).  Okra,  cup (110 mg).  Onions, raw,  cup (120 mg).  Peach, 1 each (185 mg).  Peaches, canned,  cup (120 mg).  Pears, canned,  cup (120 mg).  Peas, green, frozen,  cup (90 mg).  Peppers, green,  cup (130 mg).  Peppers, red,  cup (160 mg).  Pineapple juice,  cup (165 mg).  Pineapple, fresh or canned,  cup (100 mg).  Plums, 1 each (105 mg).  Pudding, vanilla,  cup (150 mg).  Raspberries,  cup (90 mg).  Rhubarb,  cup (115 mg).  Rice, wild,  cup (80 mg).  Shrimp, 3 oz (155 mg).  Spinach, raw, 1 cup (170 mg).  Strawberries,  cup (125 mg).  Summer squash  cup (175-200 mg).  Swiss chard, raw, 1 cup (135 mg).  Tangerines, 1 each (140 mg).  Tea, brewed, 6 oz (65 mg).  Turnips,  cup (140 mg).  Watermelon,  cup (85 mg).  Wine, red, table, 5 oz (180 mg).  Wine, white, table, 5 oz (100 mg). LOW IN POTASSIUM The following foods and beverages have less than 50 mg of potassium per serving.  Bread, white, 1 slice (30 mg).  Carbonated beverages, 12 oz (less than 5 mg).  Cheese, 1 oz (20-30 mg).  Cranberries,  cup (45 mg).  Cranberry juice cocktail,  cup (20 mg).  Fats and oils, 1 Tbsp (less than 5 mg).  Hummus, 1 Tbsp (32 mg).  Nectar: papaya, mango, or pear,  cup (35 mg).  Rice, white or brown,  cup (50 mg).  Spaghetti or macaroni,  cup cooked (30 mg).  Tortilla, flour or corn, 1 each (50 mg).  Waffle, 4 in., 1 each (50 mg).  Water chestnuts,  cup (40 mg). Document Released: 08/28/2004 Document Revised: 01/19/2013 Document Reviewed: 12/11/2012 [facility] Patient Information 2015 [place], [place]. This information is not intended to replace advice given to you by your health care  Donna [name]. Make sure you discuss any questions you have with your health care  Donna [name]. Increase metformin 2 tabs twice a day for the next 3-4 days and can decrease back to prior dose once sugars are <150. Stay on the ranitidine for 2 wks at least.

## 2013-07-21 NOTE — Progress Notes (Signed)
Left a message for patient to return call to schedule an appointment with Dr. Brigitte Pulse.

## 2013-07-27 NOTE — Progress Notes (Signed)
Left a message for patient to return call for follow up appointment with Dr. Brigitte Pulse for diabetes and hypertension,.

## 2013-09-03 ENCOUNTER — Ambulatory Visit: Payer: 59

## 2013-09-03 ENCOUNTER — Encounter: Payer: Self-pay | Admitting: Family Medicine

## 2013-09-03 ENCOUNTER — Ambulatory Visit (INDEPENDENT_AMBULATORY_CARE_PROVIDER_SITE_OTHER): Payer: 59

## 2013-09-03 ENCOUNTER — Ambulatory Visit (INDEPENDENT_AMBULATORY_CARE_PROVIDER_SITE_OTHER): Payer: 59 | Admitting: Family Medicine

## 2013-09-03 VITALS — BP 118/68 | HR 84 | Temp 97.8°F | Resp 16 | Ht 65.0 in | Wt 255.6 lb

## 2013-09-03 DIAGNOSIS — R062 Wheezing: Secondary | ICD-10-CM

## 2013-09-03 DIAGNOSIS — R9389 Abnormal findings on diagnostic imaging of other specified body structures: Secondary | ICD-10-CM

## 2013-09-03 DIAGNOSIS — R220 Localized swelling, mass and lump, head: Secondary | ICD-10-CM

## 2013-09-03 DIAGNOSIS — N949 Unspecified condition associated with female genital organs and menstrual cycle: Secondary | ICD-10-CM

## 2013-09-03 DIAGNOSIS — M25511 Pain in right shoulder: Secondary | ICD-10-CM

## 2013-09-03 DIAGNOSIS — Z23 Encounter for immunization: Secondary | ICD-10-CM

## 2013-09-03 DIAGNOSIS — E119 Type 2 diabetes mellitus without complications: Secondary | ICD-10-CM

## 2013-09-03 DIAGNOSIS — R2230 Localized swelling, mass and lump, unspecified upper limb: Secondary | ICD-10-CM

## 2013-09-03 DIAGNOSIS — M259 Joint disorder, unspecified: Secondary | ICD-10-CM

## 2013-09-03 DIAGNOSIS — E890 Postprocedural hypothyroidism: Secondary | ICD-10-CM | POA: Insufficient documentation

## 2013-09-03 DIAGNOSIS — R102 Pelvic and perineal pain: Secondary | ICD-10-CM

## 2013-09-03 DIAGNOSIS — R05 Cough: Secondary | ICD-10-CM

## 2013-09-03 DIAGNOSIS — Z1211 Encounter for screening for malignant neoplasm of colon: Secondary | ICD-10-CM

## 2013-09-03 DIAGNOSIS — M25519 Pain in unspecified shoulder: Secondary | ICD-10-CM

## 2013-09-03 DIAGNOSIS — Z79899 Other long term (current) drug therapy: Secondary | ICD-10-CM

## 2013-09-03 DIAGNOSIS — E559 Vitamin D deficiency, unspecified: Secondary | ICD-10-CM

## 2013-09-03 DIAGNOSIS — R252 Cramp and spasm: Secondary | ICD-10-CM

## 2013-09-03 DIAGNOSIS — Z113 Encounter for screening for infections with a predominantly sexual mode of transmission: Secondary | ICD-10-CM

## 2013-09-03 DIAGNOSIS — J329 Chronic sinusitis, unspecified: Secondary | ICD-10-CM

## 2013-09-03 DIAGNOSIS — I10 Essential (primary) hypertension: Secondary | ICD-10-CM | POA: Insufficient documentation

## 2013-09-03 DIAGNOSIS — R221 Localized swelling, mass and lump, neck: Secondary | ICD-10-CM

## 2013-09-03 DIAGNOSIS — Z0000 Encounter for general adult medical examination without abnormal findings: Secondary | ICD-10-CM

## 2013-09-03 DIAGNOSIS — M545 Low back pain, unspecified: Secondary | ICD-10-CM

## 2013-09-03 DIAGNOSIS — M5430 Sciatica, unspecified side: Secondary | ICD-10-CM

## 2013-09-03 DIAGNOSIS — IMO0001 Reserved for inherently not codable concepts without codable children: Secondary | ICD-10-CM

## 2013-09-03 DIAGNOSIS — Z124 Encounter for screening for malignant neoplasm of cervix: Secondary | ICD-10-CM

## 2013-09-03 DIAGNOSIS — R059 Cough, unspecified: Secondary | ICD-10-CM

## 2013-09-03 LAB — CBC
HCT: 42.3 % (ref 36.0–46.0)
Hemoglobin: 14.6 g/dL (ref 12.0–15.0)
MCH: 29.1 pg (ref 26.0–34.0)
MCHC: 34.5 g/dL (ref 30.0–36.0)
MCV: 84.3 fL (ref 78.0–100.0)
Platelets: 387 10*3/uL (ref 150–400)
RBC: 5.02 MIL/uL (ref 3.87–5.11)
RDW: 13.7 % (ref 11.5–15.5)
WBC: 11.3 10*3/uL — AB (ref 4.0–10.5)

## 2013-09-03 LAB — POCT GLYCOSYLATED HEMOGLOBIN (HGB A1C): HEMOGLOBIN A1C: 8

## 2013-09-03 LAB — GLUCOSE, POCT (MANUAL RESULT ENTRY): POC Glucose: 140 mg/dl — AB (ref 70–99)

## 2013-09-03 LAB — SEDIMENTATION RATE: SED RATE: 8 mm/h (ref 0–22)

## 2013-09-03 MED ORDER — LEVOCETIRIZINE DIHYDROCHLORIDE 5 MG PO TABS: 5.0000 mg | ORAL_TABLET | Freq: Every evening | ORAL | Status: DC

## 2013-09-03 MED ORDER — POTASSIUM CHLORIDE CRYS ER 10 MEQ PO TBCR: 10.0000 meq | EXTENDED_RELEASE_TABLET | Freq: Every day | ORAL | Status: DC

## 2013-09-03 MED ORDER — ALBUTEROL SULFATE HFA 108 (90 BASE) MCG/ACT IN AERS: 2.0000 | INHALATION_SPRAY | RESPIRATORY_TRACT | Status: DC | PRN

## 2013-09-03 MED ORDER — RANITIDINE HCL 150 MG PO TABS: 150.0000 mg | ORAL_TABLET | Freq: Two times a day (BID) | ORAL | Status: DC

## 2013-09-03 MED ORDER — TRAMADOL HCL 50 MG PO TABS: 50.0000 mg | ORAL_TABLET | Freq: Three times a day (TID) | ORAL | Status: DC | PRN

## 2013-09-03 MED ORDER — LOSARTAN POTASSIUM-HCTZ 50-12.5 MG PO TABS: 1.0000 | ORAL_TABLET | Freq: Every day | ORAL | Status: DC

## 2013-09-03 MED ORDER — PANTOPRAZOLE SODIUM 40 MG PO TBEC: 40.0000 mg | DELAYED_RELEASE_TABLET | Freq: Every day | ORAL | Status: DC

## 2013-09-03 MED ORDER — METOPROLOL TARTRATE 50 MG PO TABS: 50.0000 mg | ORAL_TABLET | Freq: Two times a day (BID) | ORAL | Status: DC
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
## 2013-09-04 ENCOUNTER — Encounter: Payer: Self-pay | Admitting: Family Medicine

## 2013-09-04 LAB — COMPREHENSIVE METABOLIC PANEL
ALBUMIN: 4.4 g/dL (ref 3.5–5.2)
ALT: 45 U/L — ABNORMAL HIGH (ref 0–35)
AST: 37 U/L (ref 0–37)
Alkaline Phosphatase: 92 U/L (ref 39–117)
BUN: 16 mg/dL (ref 6–23)
CALCIUM: 9.3 mg/dL (ref 8.4–10.5)
CO2: 27 mEq/L (ref 19–32)
Chloride: 100 mEq/L (ref 96–112)
Creat: 0.68 mg/dL (ref 0.50–1.10)
GLUCOSE: 145 mg/dL — AB (ref 70–99)
POTASSIUM: 3.5 meq/L (ref 3.5–5.3)
Sodium: 138 mEq/L (ref 135–145)
TOTAL PROTEIN: 7.6 g/dL (ref 6.0–8.3)
Total Bilirubin: 0.6 mg/dL (ref 0.2–1.2)

## 2013-09-04 LAB — HEPATITIS C ANTIBODY: HCV AB: NEGATIVE

## 2013-09-04 LAB — VITAMIN D 25 HYDROXY (VIT D DEFICIENCY, FRACTURES): Vit D, 25-Hydroxy: 30 ng/mL (ref 30–89)

## 2013-09-04 LAB — LIPID PANEL
CHOL/HDL RATIO: 4.1 ratio
CHOLESTEROL: 178 mg/dL (ref 0–200)
HDL: 43 mg/dL (ref >39)
LDL Cholesterol: 100 mg/dL — ABNORMAL HIGH (ref 0–99)
Triglycerides: 176 mg/dL — ABNORMAL HIGH (ref <150)
VLDL: 35 mg/dL (ref 0–40)

## 2013-09-04 LAB — TSH: TSH: 0.787 u[IU]/mL (ref 0.350–4.500)

## 2013-09-04 LAB — MAGNESIUM: MAGNESIUM: 1.7 mg/dL (ref 1.5–2.5)

## 2013-09-04 LAB — HIV ANTIBODY (ROUTINE TESTING W REFLEX): HIV 1&2 Ab, 4th Generation: NONREACTIVE

## 2013-09-04 LAB — CK: Total CK: 67 U/L (ref 7–177)

## 2013-09-04 LAB — PHOSPHORUS: PHOSPHORUS: 3.1 mg/dL (ref 2.3–4.6)

## 2013-09-04 LAB — RPR

## 2013-09-05 MED ORDER — LEVOTHYROXINE SODIUM 25 MCG PO TABS: 25.0000 ug | ORAL_TABLET | Freq: Every day | ORAL | Status: DC

## 2013-09-05 MED ORDER — METFORMIN HCL 1000 MG PO TABS: 1000.0000 mg | ORAL_TABLET | Freq: Two times a day (BID) | ORAL | Status: DC

## 2013-09-05 MED ORDER — ERGOCALCIFEROL 1.25 MG (50000 UT) PO CAPS: 50000.0000 [IU] | ORAL_CAPSULE | ORAL | Status: DC

## 2013-09-06 ENCOUNTER — Encounter: Payer: Self-pay | Admitting: Family Medicine

## 2013-09-07 LAB — PAP IG, CT-NG NAA, HPV HIGH-RISK
Chlamydia Probe Amp: NEGATIVE
GC Probe Amp: NEGATIVE
HPV DNA High Risk: NOT DETECTED

## 2013-09-10 ENCOUNTER — Ambulatory Visit
Admission: RE | Admit: 2013-09-10 | Discharge: 2013-09-10 | Disposition: A | Discharge status: Home / Self Care | Payer: 59 | Source: Ambulatory Visit | Attending: Family Medicine | Admitting: Family Medicine

## 2013-09-10 ENCOUNTER — Encounter: Payer: Self-pay | Admitting: Family Medicine

## 2013-09-10 DIAGNOSIS — R2230 Localized swelling, mass and lump, unspecified upper limb: Secondary | ICD-10-CM

## 2013-09-10 DIAGNOSIS — R102 Pelvic and perineal pain: Secondary | ICD-10-CM

## 2013-09-10 MED ORDER — IOHEXOL 300 MG/ML  SOLN
75.0000 mL | Freq: Once | INTRAMUSCULAR | Status: AC | PRN
  Administered 2013-09-10: 75 mL via INTRAVENOUS

## 2013-09-10 NOTE — Addendum Note (Signed)
Addended by: [name] on: 09/10/2013 03:44 PM   Modules accepted: Orders

## 2013-09-12 ENCOUNTER — Telehealth: Payer: Self-pay | Admitting: Family Medicine

## 2013-09-12 ENCOUNTER — Ambulatory Visit: Payer: 59

## 2013-09-12 NOTE — Telephone Encounter (Signed)
Patient wants to discuss results of CT Scan.   [phone]

## 2013-09-13 NOTE — Telephone Encounter (Signed)
Left message on machine, results are not currently available, please cb

## 2013-09-14 ENCOUNTER — Telehealth: Payer: Self-pay

## 2013-09-14 NOTE — Telephone Encounter (Signed)
PT WOULD LIKE TO KNOW FROM DR [name] WHEN CAN SHE START BACK TAKING HER METFORMIN, HAD A CT DONE AND COULDN'T TAKE THEN, ALSO SHE HAVE AN APPT ON Friday FOR HER ULTRA SOUND. YOU MAY LEAVE A MESSAGE AT [phone]

## 2013-09-15 ENCOUNTER — Telehealth: Payer: Self-pay

## 2013-09-15 NOTE — Telephone Encounter (Signed)
Lm for pt to wait until her appt with Dr. Brigitte Pulse on Friday to resume her medications.

## 2013-09-17 ENCOUNTER — Ambulatory Visit
Admission: RE | Admit: 2013-09-17 | Discharge: 2013-09-17 | Disposition: A | Discharge status: Home / Self Care | Payer: 59 | Source: Ambulatory Visit | Attending: Family Medicine | Admitting: Family Medicine

## 2013-09-17 ENCOUNTER — Ambulatory Visit (INDEPENDENT_AMBULATORY_CARE_PROVIDER_SITE_OTHER): Payer: 59 | Admitting: Family Medicine

## 2013-09-17 ENCOUNTER — Encounter: Payer: Self-pay | Admitting: Family Medicine

## 2013-09-17 VITALS — BP 130/90 | HR 70 | Temp 98.4°F | Resp 16 | Ht 65.0 in | Wt 258.0 lb

## 2013-09-17 DIAGNOSIS — R221 Localized swelling, mass and lump, neck: Principal | ICD-10-CM

## 2013-09-17 DIAGNOSIS — E119 Type 2 diabetes mellitus without complications: Secondary | ICD-10-CM

## 2013-09-17 DIAGNOSIS — R5381 Other malaise: Secondary | ICD-10-CM

## 2013-09-17 DIAGNOSIS — R220 Localized swelling, mass and lump, head: Secondary | ICD-10-CM

## 2013-09-17 DIAGNOSIS — E1165 Type 2 diabetes mellitus with hyperglycemia: Secondary | ICD-10-CM

## 2013-09-17 DIAGNOSIS — R252 Cramp and spasm: Secondary | ICD-10-CM

## 2013-09-17 DIAGNOSIS — R5383 Other fatigue: Secondary | ICD-10-CM

## 2013-09-17 DIAGNOSIS — G478 Other sleep disorders: Secondary | ICD-10-CM

## 2013-09-17 DIAGNOSIS — IMO0001 Reserved for inherently not codable concepts without codable children: Secondary | ICD-10-CM

## 2013-09-17 DIAGNOSIS — R1310 Dysphagia, unspecified: Secondary | ICD-10-CM

## 2013-09-17 MED ORDER — CYCLOBENZAPRINE HCL 10 MG PO TABS: 10.0000 mg | ORAL_TABLET | Freq: Every day | ORAL | Status: DC

## 2013-09-17 NOTE — Patient Instructions (Signed)
Call [facility] to schedule a visit with Dr. Toney Rakes or [place] ASAP - you need to have an endometrial biopsy so check for uterine cancer.  Call to schedule your GI visit so you can then schedule your colonoscopy.   After you [place] today, we will decide whether you need to continue on your levothyroxine and if you need to go back to see the surgeon or an endocrinologist.  We will also consider whether you need ENT to see if it is the thyroid causing your problems swalloing or something else.  Try the cyclobenzaprine at night to help with the leg cramping.  We will likely need to send you to the neurologist for a sleep study to evaluate for sleep apnea but have held off for now since you have so much else going on.  Restart your metformin - 2 tabs twice a day of the 500mg  until you run out and start the 1000mg  twice a day.

## 2013-09-20 ENCOUNTER — Telehealth: Payer: Self-pay | Admitting: Family Medicine

## 2013-09-20 NOTE — Telephone Encounter (Signed)
Patient completed FMLA form on 09/17/2013 and paid her $15.00. Patient's employer faxed [place] the paperwork that needs to be completed by Dr. Brigitte Pulse. These were placed in her box on 09/20/2013. Awaiting completion of ppw in 5-7 business days and will call patient for pick up as indicated on her form.

## 2013-09-21 DIAGNOSIS — Z0271 Encounter for disability determination: Secondary | ICD-10-CM

## 2013-09-23 ENCOUNTER — Other Ambulatory Visit: Payer: Self-pay

## 2013-09-23 DIAGNOSIS — Q892 Congenital malformations of other endocrine glands: Secondary | ICD-10-CM

## 2013-09-27 NOTE — Progress Notes (Signed)
Subjective:    Patient ID: Donna [name], female    DOB: 05/25/1955, 58 y.o.   MRN: [id number]  This chart was scribed for Shawnee Knapp, MD by Rosary Lively, ED scribe. This patient was seen in room Room/bed 23 and the patient's care was started at 9:50 AM.   Chief Complaint  Patient presents with  . Follow-up    pt is here for f/u regarding CAT Scan    HPI HPI Comments:  Donna [name] is a 58 y.o. female who presents to [facility] for a follow up regarding CAT Scan.   Ms Lavalle came in two weeks ago for a full physical 2 weeks ago and a mass was discovered above the right medial clavicle. 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 An ultrasound was ordered of the neck soft tissues to determine if this is truly the case. This would explain why she is needing progressively less levothyroxine. We may even want to try her off of the levothyroxine.   At last visit, pt also advised to increase metformin from 500 BID to 1000 BID, and advised low fat diet due to elevated liver enzymes. After diet changes, she will need a repeat lipid panel in 3 to 6 months, if not improved, restart lipostatin. Also advised to continue on once weekly vitamin D, as level is still low. No etiology was found for her muscle cramps.  Pt referred to [facility] for endometrial scan due to thickened endometrium on ultrasound, does not appear that this has been scheduled as of yet.  Concerns: Pt also reports fatigue during the day, issues swallowing, and continued leg cramps.  Fatigue: Pt reprots that she is very tired all of the time. She reports that she sleeps at night for 3 to 4 hours, awakes, sometimes due to cramps, and uses the restroom. She reports that she may sleep for another hour and then awakes again. Pt reports that she may snore at night.   Issues Swallowing: Pt reports that 2 to 3 times a week, her food does not go down  correctly.   Leg Cramps: Pt repeorts that she has still experienced cramps in her legs.      Past Medical History  Diagnosis Date  . Hypertension   . Hypothyroid   . Diverticulitis   . Arthritis   . Allergy   . Diabetes mellitus without complication    Current Outpatient Prescriptions on File Prior to Visit  Medication Sig Dispense Refill  . albuterol (PROVENTIL HFA;VENTOLIN HFA) 108 (90 BASE) MCG/ACT inhaler Inhale 2 puffs into the lungs every 4 (four) hours as needed for wheezing or shortness of breath.  1 Inhaler  11  . ergocalciferol (VITAMIN D2) 50000 UNITS capsule Take 1 capsule (50,000 Units total) by mouth once a week.  12 capsule  1  . Fluocinolone Acetonide 0.01 % OIL Place 5 drops in ear(s) 2 (two) times daily. X 1 wk  20 mL  1  . hydrOXYzine (ATARAX/VISTARIL) 25 MG tablet Take 0.5-1 tablets (12.5-25 mg total) by mouth every 8 (eight) hours as needed for itching.  30 tablet  2  . ibuprofen (ADVIL,MOTRIN) 800 MG tablet Take 800 mg by mouth every 8 (eight) hours as needed. For pain      . ipratropium (ATROVENT) 0.06 % nasal spray Place 2 sprays into the nose 3 (three) times daily.  15 mL  0  . levocetirizine (XYZAL) 5 MG tablet Take 1 tablet (5 mg total) by mouth every evening.  30 tablet  5  . levothyroxine (SYNTHROID, LEVOTHROID) 25 MCG tablet Take 1 tablet (25 mcg total) by mouth daily.  90 tablet  1  . losartan-hydrochlorothiazide (HYZAAR) 50-12.5 MG per tablet Take 1 tablet by mouth daily.  90 tablet  1  . metoprolol (LOPRESSOR) 50 MG tablet Take 1 tablet (50 mg total) by mouth 2 (two) times daily.  180 tablet  1  . pantoprazole (PROTONIX) 40 MG tablet Take 1 tablet (40 mg total) by mouth daily.  90 tablet  1  . potassium chloride (K-DUR,KLOR-CON) 10 MEQ tablet Take 1 tablet (10 mEq total) by mouth daily.  90 tablet  1  . ranitidine (ZANTAC) 150 MG tablet Take 1 tablet (150 mg total) by mouth 2 (two) times daily.  180 tablet  1  . traMADol (ULTRAM) 50 MG tablet Take 1  tablet (50 mg total) by mouth every 8 (eight) hours as needed for moderate pain. For pain  60 tablet  5  . metFORMIN (GLUCOPHAGE) 1000 MG tablet Take 1 tablet (1,000 mg total) by mouth 2 (two) times daily with a meal.  180 tablet  1   No current facility-administered medications on file prior to visit.   Allergies  Allergen Reactions  . Glimepiride Other (See Comments)    Per patient causes severe gas pain  . Morphine And Related   . Colcrys [Colchicine] Rash  . Lisinopril Rash    Review of Systems  Constitutional: Positive for fatigue. Negative for fever and chills.  HENT: Positive for trouble swallowing.       BP 130/90  Pulse 70  Temp(Src) 98.4 F (36.9 C) (Oral)  Resp 16  Ht 5\' 5"  (1.651 m)  Wt 258 lb (117.028 kg)  BMI 42.93 kg/m2  SpO2 96% Objective:   Physical Exam  Nursing note and vitals reviewed. Constitutional: She is oriented to person, place, and time. She appears well-developed and well-nourished.  HENT:  Head: Normocephalic and atraumatic.  Eyes: EOM are normal.  Neck: Normal range of motion. Neck supple.  Cardiovascular: Normal rate, regular rhythm and normal heart sounds.   Pulmonary/Chest: Effort normal and breath sounds normal.  Musculoskeletal: Normal range of motion.  Neurological: She is alert and oriented to person, place, and time.  Skin: Skin is warm and dry.  Psychiatric: She has a normal mood and affect. Her behavior is normal.          Assessment & Plan:    Pheunomovax and Tdap done 8/7/ 2015. Reffered to GI for colonoscopy at last visit, but pt has not scheduled yet.  Reffered to Dr. Toney Rakes in Gynecology, and pt was scheduled today.  Muscle cramp, nocturnal  Other malaise and fatigue  Poor sleep pattern  Type II or unspecified type diabetes mellitus without mention of complication, not stated as uncontrolled - Plan: [facility] Diabetes Foot Exam  Type II or unspecified type diabetes mellitus without mention of complication,  uncontrolled  Dysphagia, unspecified(787.20)  Meds ordered this encounter  Medications  . cyclobenzaprine (FLEXERIL) 10 MG tablet    Sig: Take 1 tablet (10 mg total) by mouth at bedtime.    Dispense:  30 tablet    Refill:  0    I personally performed the services described in this documentation, which was scribed in my presence. The recorded information has been reviewed and considered, and addended by me as needed.  [name],  MD MPH

## 2013-09-27 NOTE — Telephone Encounter (Signed)
Patient called to inquire about the status of her FMLA paperwork. Placed in Dr. Raul Del box on the 24th. We are at the 5th business day of our 5-7 business day policy for completion of paperwork. Advised patient that I will call when those have been finished.

## 2013-09-28 NOTE — Telephone Encounter (Signed)
Forms done and put up at 102 check out fmla box

## 2013-09-28 NOTE — Telephone Encounter (Signed)
  Spoke to pt, she is aware her forms aware ready for p/u     Faxed to [employer] # provided on form.

## 2013-09-29 ENCOUNTER — Encounter: Payer: Self-pay | Admitting: Gynecology

## 2013-09-29 ENCOUNTER — Ambulatory Visit (INDEPENDENT_AMBULATORY_CARE_PROVIDER_SITE_OTHER): Payer: 59 | Admitting: Gynecology

## 2013-09-29 VITALS — BP 138/88 | Ht 65.0 in | Wt 254.0 lb

## 2013-09-29 DIAGNOSIS — Z780 Asymptomatic menopausal state: Secondary | ICD-10-CM

## 2013-09-29 DIAGNOSIS — R9389 Abnormal findings on diagnostic imaging of other specified body structures: Secondary | ICD-10-CM

## 2013-09-29 DIAGNOSIS — N83209 Unspecified ovarian cyst, unspecified side: Secondary | ICD-10-CM

## 2013-09-29 NOTE — Progress Notes (Signed)
   Patient is a 58 year old gravida 3 para 1 AB 2 who was referred to office by her PCP Dr. [name] as a result a recent ultrasound done because of nonspecific female pelvic pain and morbid obesity with difficulty completing a thorough pelvic exam.  Patient is a type II diabetic hypertensive name followed by Dr. Brigitte Pulse. Patient's recent ultrasound at the hospital demonstrated the following:  Uterus measuring 9.2 x 4.5 x 5.6 cm. No fibroids Were noted and no masses. Left ovary not visualized. Right ovary measured 2.6 x 2.2 x 2.3 cm normal in appearance and within the next possible paratubal cyst measuring 2.0 x 1.5 x 1.5 cm. It was noted that her endometrial stripe was 14 mm and within the fundus there was a 5 mm hypoechoic area  Patient denied any past history of hormone replacement therapy usage or any postmenopausal bleeding. Her last menstrual cycle was at the age of 36. Patient stated her PCP did her Pap smear additionally it was reported to be normal.  Exam: Abdomen: Pendulous soft nontender no rebound or guarding Pelvic: Bartholin urethra Skene glands within normal limits Vagina: No lesions or discharge Cervix no lesions or discharge Uterus anteverted normal size shape and consistency Adnexa questionable right adnexal fullness nontender Rectovaginal exam: Unremarkable  The patient was counseled for an endometrial biopsy to rule out endometrial hyperplasia or endometrial cancer. The cervix was cleansed with Betadine solution. A single-tooth tenaculum was placed on the anterior cervical lip. A sterile Pipelle was introduced into the uterine cavity. The uterus sounded to 7-1/2 cm. Moderate amount of tissue was obtained which was submitted for histological evaluation.  Assessment/plan: Overweight type II diabetic and hypertensive control with incidental finding at time of ultrasound with thickened endometrium of 11 mm in a postmenopausal patient on no HRT and no vaginal bleeding reported.  Results of endometrial biopsy pending at time of this dictation. If there is no evidence of hyperplasia or endometrial cancer she will return back in 1-2 weeks for a sonohysterogram to rule out endometrial polyp. It appears also that she has a very small paratubal cyst on the right adnexa.

## 2013-10-01 ENCOUNTER — Other Ambulatory Visit: Payer: Self-pay | Admitting: Gynecology

## 2013-10-01 ENCOUNTER — Other Ambulatory Visit: Payer: Self-pay | Admitting: Otolaryngology

## 2013-10-01 DIAGNOSIS — N83339 Acquired atrophy of ovary and fallopian tube, unspecified side: Secondary | ICD-10-CM

## 2013-10-01 DIAGNOSIS — R9389 Abnormal findings on diagnostic imaging of other specified body structures: Secondary | ICD-10-CM

## 2013-10-01 DIAGNOSIS — E049 Nontoxic goiter, unspecified: Secondary | ICD-10-CM

## 2013-10-05 ENCOUNTER — Telehealth: Payer: Self-pay | Admitting: Gynecology

## 2013-10-05 ENCOUNTER — Telehealth: Payer: Self-pay

## 2013-10-05 NOTE — Telephone Encounter (Signed)
Spoke with Donna [name] today in reference to her pathology report. Patient on September 2 had an endometrial biopsy as a result of an ultrasound demonstrating a postmenopausal thickened endometrium (14 mm) on this patient with no vaginal bleeding and a normal replacement therapy.   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  The results were discussed with the patient. I have contacted the [facility] group and they will be making an appointment to see her in consultation to coordinate her surgery.

## 2013-10-05 NOTE — Telephone Encounter (Signed)
Called in voice mail returning Dr. Durenda Guthrie call regarding pathology report.

## 2013-10-05 NOTE — Telephone Encounter (Signed)
I have informed Donna [name] the results from her endometrial biopsy demonstrating endometrial cancer. She is awaiting for phone call for consultation with [place] oncologists.Thanks

## 2013-10-05 NOTE — Telephone Encounter (Signed)
Patient called again saying she is returning Dr. Durenda Guthrie call.  We are only able to call her back and leave a message at her home number but she did say she will try back at 4:30pm.  I left her a message when she calls back to call on private line so that we can help get her in touch with Dr. Moshe Salisbury.  Left message on recorder at GYN-Onc clinic at [facility] for Dr. Clayborn Heron to call you.

## 2013-10-06 NOTE — Telephone Encounter (Signed)
I have informed Donna [name] the results from her endometrial biopsy demonstrating endometrial cancer. She is awaiting for phone call for consultation with [place] oncologists.Thanks

## 2013-10-07 ENCOUNTER — Encounter: Payer: Self-pay | Admitting: Family Medicine

## 2013-10-08 ENCOUNTER — Telehealth: Payer: Self-pay

## 2013-10-08 ENCOUNTER — Encounter: Payer: Self-pay | Admitting: Family Medicine

## 2013-10-08 NOTE — Telephone Encounter (Signed)
No cancel sonohys and appointment follow up with me.

## 2013-10-08 NOTE — Telephone Encounter (Signed)
Dr. Moshe Salisbury told patient if she had not heard from [place] by noon today to call regarding appt.  I called [facility] and they had no appointment for her. We scheduled her for Thursday, 10/14/13 at 1:45pm and she will check in at 1:15pm. Appt with Dr. Everitt Amber at [facility].  Dr. JF-Patient asked does she need to keep [facility] appt scheduled 10/20/13 with you?

## 2013-10-08 NOTE — Telephone Encounter (Signed)
Patient informed no need to keep [facility] appt and it has been cancelled.

## 2013-10-11 ENCOUNTER — Telehealth: Payer: Self-pay | Admitting: Family Medicine

## 2013-10-11 NOTE — Telephone Encounter (Signed)
Patient called and states that her employer has not received the updated copy of her FMLA ppw. Told patient that I did fax ppw the day that Dr. Brigitte Pulse made the correction (10/07/2013). Patient said she would check again with her employer and call the office back if they need to be faxed again.

## 2013-10-12 ENCOUNTER — Ambulatory Visit
Admission: RE | Admit: 2013-10-12 | Discharge: 2013-10-12 | Disposition: A | Discharge status: Home / Self Care | Payer: 59 | Source: Ambulatory Visit | Attending: Otolaryngology | Admitting: Otolaryngology

## 2013-10-12 ENCOUNTER — Other Ambulatory Visit (HOSPITAL_COMMUNITY)
Admission: RE | Admit: 2013-10-12 | Discharge: 2013-10-12 | Disposition: A | Discharge status: Home / Self Care | Payer: 59 | Source: Ambulatory Visit | Attending: Interventional Radiology | Admitting: Interventional Radiology

## 2013-10-12 DIAGNOSIS — M799 Soft tissue disorder, unspecified: Secondary | ICD-10-CM | POA: Diagnosis not present

## 2013-10-12 DIAGNOSIS — E049 Nontoxic goiter, unspecified: Secondary | ICD-10-CM

## 2013-10-14 ENCOUNTER — Encounter: Payer: Self-pay | Admitting: Gynecologic Oncology

## 2013-10-14 ENCOUNTER — Ambulatory Visit: Payer: 59 | Attending: Gynecologic Oncology | Admitting: Gynecologic Oncology

## 2013-10-14 VITALS — BP 149/95 | HR 85 | Temp 98.0°F | Resp 22 | Ht 65.0 in | Wt 255.5 lb

## 2013-10-14 DIAGNOSIS — K5732 Diverticulitis of large intestine without perforation or abscess without bleeding: Secondary | ICD-10-CM | POA: Diagnosis not present

## 2013-10-14 DIAGNOSIS — Z9089 Acquired absence of other organs: Secondary | ICD-10-CM | POA: Insufficient documentation

## 2013-10-14 DIAGNOSIS — Z87891 Personal history of nicotine dependence: Secondary | ICD-10-CM | POA: Insufficient documentation

## 2013-10-14 DIAGNOSIS — Z6841 Body Mass Index (BMI) 40.0 and over, adult: Secondary | ICD-10-CM | POA: Diagnosis not present

## 2013-10-14 DIAGNOSIS — E669 Obesity, unspecified: Secondary | ICD-10-CM | POA: Diagnosis not present

## 2013-10-14 DIAGNOSIS — Z79899 Other long term (current) drug therapy: Secondary | ICD-10-CM | POA: Insufficient documentation

## 2013-10-14 DIAGNOSIS — C549 Malignant neoplasm of corpus uteri, unspecified: Secondary | ICD-10-CM | POA: Diagnosis not present

## 2013-10-14 DIAGNOSIS — C541 Malignant neoplasm of endometrium: Secondary | ICD-10-CM

## 2013-10-14 DIAGNOSIS — E0789 Other specified disorders of thyroid: Secondary | ICD-10-CM | POA: Insufficient documentation

## 2013-10-14 MED ORDER — METRONIDAZOLE 500 MG PO TABS: 500.0000 mg | ORAL_TABLET | Freq: Three times a day (TID) | ORAL | Status: DC

## 2013-10-14 MED ORDER — MAGNESIUM CITRATE PO SOLN: 1.0000 | Freq: Once | ORAL | Status: DC

## 2013-10-14 MED ORDER — CIPROFLOXACIN HCL 500 MG PO TABS: 500.0000 mg | ORAL_TABLET | Freq: Two times a day (BID) | ORAL | Status: DC

## 2013-10-14 NOTE — Progress Notes (Signed)
Consult Note: Gyn-Onc  Consult was requested by Dr. Uvaldo Rising for the evaluation of Donna [name] 58 y.o. female with grade 1 endometrial cancer  CC:  Chief Complaint  Patient presents with  . Cancer    Assessment/Plan:  Donna [name]  is a 58 y.o.  year old obese woman who has grade 1 endometrial cancer on endometrial biopsy.   A detailed discussion was held with the patient and her family with regard to to her endometrial cancer diagnosis. We discussed the standard management options for uterine cancer which includes surgery followed possibly by adjuvant therapy depending on the results of surgery. The options for surgical management include a hysterectomy and removal of the tubes and ovaries possibly with removal of pelvic and para-aortic lymph nodes. A minimally invasive approach including a robotic hysterectomy or laparoscopic hysterectomy have benefits including shorter hospital stay, recovery time and better wound healing. The alternative approach is an open hysterectomy. The patient has been counseled about these surgical options and the risks of surgery in general including infection, bleeding, damage to surrounding structures (including bowel, bladder, ureters, nerves or vessels), and the postoperative risks of PE/ DVT, and lymphedema. I extensively reviewed the additional risks of robotic hysterectomy including possible need for conversion to open laparotomy.  I discussed positioning during surgery of trendelenberg and risks of minor facial swelling and care we take in preoperative positioning.  After counseling and consideration of her options, she desires to proceed with robotic hysterectomy, BSO, possible lymphadenectomy.    She additionally has symptoms and physical exam findings concerning for mild diverticulitis. I am prescribing a 1 week course of cipro and flagyl for her to take orally. She is afebrile today and has no manifestations of severe systemic infection. I have  also prescribed a bowel prep for her to take the day before surgery as she suffers from constipation.  She will be seen by anesthesia for preoperative clearance and discussion of postoperative pain management.  She was given the opportunity to ask questions, which were answered to her satisfaction, and she is agreement with the above mentioned plan of care.    HPI: Donna [name] is a 58 year old woman who is seen in consultation at the request of Dr Toney Rakes for a grade 1 endometrial cancer. The patient reports presenting for her annual gynecologic visit in August. She had no concerning symptoms, including no vaginal bleeding. She had a normal pap smear. On bimanual examination tenderness was appreciated in the left adnexa. This prompted a transvaginal US (09/10/13) which showed a 9x4.5x5.6cm uterus with a 67mm endometrial stripe. The left ovary was not visualized. The right ovary contained a 2x1x1.5cm cyst. She reports that she had a blood test which showed a mildly elevated WBC (11.3 on 09/03/13).  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    Of note she has a history of diverticulitis in the past.  Interval History: she has some loose stools.  Current Meds:  Outpatient Encounter Prescriptions as of 10/14/2013  Medication Sig  . albuterol (PROVENTIL HFA;VENTOLIN HFA) 108 (90 BASE) MCG/ACT inhaler Inhale 2 puffs into the lungs every 4 (four) hours as needed for wheezing or shortness of breath.  . ciprofloxacin (CIPRO) 500 MG tablet Take 1 tablet (500 mg total) by mouth 2 (two) times daily.  . cyclobenzaprine (FLEXERIL) 10  MG tablet Take 1 tablet (10 mg total) by mouth at bedtime.  . ergocalciferol (VITAMIN D2) 50000 UNITS capsule Take 1 capsule (50,000 Units total) by mouth once  a week.  . Fluocinolone Acetonide 0.01 % OIL Place 5 drops in ear(s) 2 (two) times daily. X 1 wk  . hydrOXYzine (ATARAX/VISTARIL) 25 MG tablet Take 0.5-1 tablets (12.5-25 mg total) by mouth every 8 (eight) hours as needed for itching.  Marland Kitchen ibuprofen (ADVIL,MOTRIN) 800 MG tablet Take 800 mg by mouth every 8 (eight) hours as needed. For pain  . ipratropium (ATROVENT) 0.06 % nasal spray Place 2 sprays into the nose 3 (three) times daily.  Marland Kitchen levocetirizine (XYZAL) 5 MG tablet Take 1 tablet (5 mg total) by mouth every evening.  Marland Kitchen levothyroxine (SYNTHROID, LEVOTHROID) 25 MCG tablet Take 1 tablet (25 mcg total) by mouth daily.  Marland Kitchen losartan-hydrochlorothiazide (HYZAAR) 50-12.5 MG per tablet Take 1 tablet by mouth daily.  . magnesium citrate SOLN Take 296 mLs (1 Bottle total) by mouth once.  . metFORMIN (GLUCOPHAGE) 1000 MG tablet Take 1 tablet (1,000 mg total) by mouth 2 (two) times daily with a meal.  . metoprolol (LOPRESSOR) 50 MG tablet Take 1 tablet (50 mg total) by mouth 2 (two) times daily.  . metroNIDAZOLE (FLAGYL) 500 MG tablet Take 1 tablet (500 mg total) by mouth 3 (three) times daily.  . pantoprazole (PROTONIX) 40 MG tablet Take 1 tablet (40 mg total) by mouth daily.  . potassium chloride (K-DUR,KLOR-CON) 10 MEQ tablet Take 1 tablet (10 mEq total) by mouth daily.  . ranitidine (ZANTAC) 150 MG tablet Take 1 tablet (150 mg total) by mouth 2 (two) times daily.  . traMADol (ULTRAM) 50 MG tablet Take 1 tablet (50 mg total) by mouth every 8 (eight) hours as needed for moderate pain. For pain    Allergy:  Allergies  Allergen Reactions  . Glimepiride Other (See Comments)    Per patient causes severe gas pain  . Morphine And Related Nausea And Vomiting  . Colcrys [Colchicine] Rash  . Lisinopril Rash    Social Hx:   History   Social History  . Marital Status: Single    Spouse Name: N/A    Number of Children: N/A  . Years of Education: N/A   Occupational History  . Not on file.   Social  History Main Topics  . Smoking status: Former Research scientist (life sciences)  . Smokeless tobacco: Not on file  . Alcohol Use: Yes  . Drug Use: No  . Sexual Activity: No   Other Topics Concern  . Not on file   Social History Narrative  . No narrative on file    Past Surgical Hx:  Past Surgical History  Procedure Laterality Date  . Cholecystectomy    . Thyroidectomy      Past Medical Hx:  Past Medical History  Diagnosis Date  . Hypertension   . Hypothyroid   . Diverticulitis   . Arthritis   . Allergy   . Diabetes mellitus without complication     Past Gynecological History:  G1P1 (SVD). Menopause age 23. Endometrial cancer.  No LMP recorded. Patient is postmenopausal.  Family Hx:  Family History  Problem Relation Age of Onset  . Stroke Mother   . Cancer Father   . Cancer Brother     Review of Systems:  Constitutional  Feels well,    ENT Normal appearing ears and nares bilaterally Skin/Breast  No rash, sores, jaundice, itching, dryness Cardiovascular  No chest pain,  shortness of breath, or edema  Pulmonary  No cough or wheeze.  Gastro Intestinal  No nausea, vomitting, or diarrhoea. No bright red blood per rectum, no abdominal pain, change in bowel movement, or constipation.  Genito Urinary  No frequency, urgency, dysuria,  Musculo Skeletal  No myalgia, arthralgia, joint swelling or pain  Neurologic  No weakness, numbness, change in gait,  Psychology  No depression, anxiety, insomnia.   Vitals:  Blood pressure 149/95, pulse 85, temperature 98 F (36.7 C), temperature source Oral, resp. rate 22, height 5\' 5"  (1.651 m), weight 255 lb 8 oz (115.894 kg).  Physical Exam: WD in NAD Neck  Supple NROM, without any enlargements.  Lymph Node Survey No cervical supraclavicular or inguinal adenopathy Cardiovascular  Pulse normal rate, regularity and rhythm. S1 and S2 normal.  Lungs  Clear to auscultation bilateraly, without wheezes/crackles/rhonchi. Good air movement.  Skin   No rash/lesions/breakdown  Psychiatry  Alert and oriented to person, place, and time  Abdomen  Normoactive bowel sounds, abdomen soft, non-tender and obese without evidence of hernia. Well healed upper abdominal cholecystectomy incision (subcostal Rt). Back No CVA tenderness Genito Urinary  Vulva/vagina: Normal external female genitalia.  No lesions. No discharge or bleeding.  Bladder/urethra:  No lesions or masses, well supported bladder  Vagina: normal appearing  Cervix: Normal appearing, no lesions.  Uterus:  Small, mobile, no parametrial involvement or nodularity.  Adnexa: no palpable masses though the exam is limited by body habitus. There was tenderness in the left fornix with deep palpation.. Rectal  Good tone, no masses no cul de sac nodularity.  Extremities  No bilateral cyanosis, clubbing or edema.   Donaciano Eva, MD   10/14/2013, 1:49 PM

## 2013-10-14 NOTE — Patient Instructions (Addendum)
Preparing for your Surgery  Plan for surgery on October 27 with Dr. Denman George.  Pre-operative Testing -You will receive a phone call from presurgical testing at [facility] to arrange for a pre-operative testing appointment before your surgery.  This appointment normally occurs one to two weeks before your scheduled surgery.   -Bring your insurance card, copy of an advanced directive if applicable, medication list  -At that visit, you will be asked to sign a consent for a possible blood transfusion in case a transfusion becomes necessary during surgery.  The need for a blood transfusion is rare but having consent is a necessary part of your care.     -You should not be taking blood thinners or aspirin at least ten days prior to surgery unless instructed by your surgeon.  Day Before Surgery at [place] will be asked to take in only clear liquids the day before surgery.  Examples of clear liquids include broths, jello, and clear juices.  You may also be advised to perform a Mag Citrate bowel prep the night before your surgery based off of your provider's recommendations.  You will be advised to have nothing to eat or drink after midnight the evening before.    Your role in recovery Your role is to become active as soon as directed by your doctor, while still giving yourself time to heal.  Rest when you feel tired. You will be asked to do the following in order to speed your recovery:  - Cough and breathe deeply. This helps toclear and expand your lungs and can prevent pneumonia. You may be given a spirometer to practice deep breathing. A staff member will show you how to use the spirometer. - Do mild physical activity. Walking or moving your legs help your circulation and body functions return to normal. A staff member will help you when you try to walk and will provide you with simple exercises. Do not try to get up or walk alone the first time. - Actively manage your pain.  Managing your pain lets you move in comfort. We will ask you to rate your pain on a scale of zero to 10. It is your responsibility to tell your doctor or nurse where and how much you hurt so your pain can be treated.  Special Considerations -If you are diabetic, you may be placed on insulin after surgery to have closer control over your blood sugars to promote healing and recovery.  This does not mean that you will be discharged on insulin.  If applicable, your oral antidiabetics will be resumed when you are tolerating a solid diet.  -Your final pathology results from surgery should be available by the Friday after surgery and the results will be relayed to you when available.  Blood Transfusion Information WHAT IS A BLOOD TRANSFUSION? A transfusion is the replacement of blood or some of its parts. Blood is made up of multiple cells which provide different functions.  Red blood cells carry oxygen and are used for blood loss replacement.  White blood cells fight against infection.  Platelets control bleeding.  Plasma helps clot blood.  Other blood products are available for specialized needs, such as hemophilia or other clotting disorders. BEFORE THE TRANSFUSION  Who gives blood for transfusions?   You may be able to donate blood to be used at a later date on yourself (autologous donation).  Relatives can be asked to donate blood. This is generally not any safer than if you have received  blood from a stranger. The same precautions are taken to ensure safety when a relative's blood is donated.  Healthy volunteers who are fully evaluated to make sure their blood is safe. This is blood bank blood. Transfusion therapy is the safest it has ever been in the practice of medicine. Before blood is taken from a donor, a complete history is taken to make sure that person has no history of diseases nor engages in risky social behavior (examples are intravenous drug use or sexual activity with multiple  partners). The donor's travel history is screened to minimize risk of transmitting infections, such as malaria. The donated blood is tested for signs of infectious diseases, such as HIV and hepatitis. The blood is then tested to be sure it is compatible with you in order to minimize the chance of a transfusion reaction. If you or a relative donates blood, this is often done in anticipation of surgery and is not appropriate for emergency situations. It takes many days to process the donated blood. RISKS AND COMPLICATIONS Although transfusion therapy is very safe and saves many lives, the main dangers of transfusion include:   Getting an infectious disease.  Developing a transfusion reaction. This is an allergic reaction to something in the blood you were given. Every precaution is taken to prevent this. The decision to have a blood transfusion has been considered carefully by your caregiver before blood is given. Blood is not given unless the benefits outweigh the risks.

## 2013-10-15 ENCOUNTER — Ambulatory Visit: Payer: 59 | Admitting: Family Medicine

## 2013-10-20 ENCOUNTER — Other Ambulatory Visit: Payer: 59

## 2013-10-20 ENCOUNTER — Ambulatory Visit: Payer: 59 | Admitting: Gynecology

## 2013-10-22 ENCOUNTER — Encounter: Payer: Self-pay | Admitting: Family Medicine

## 2013-10-22 ENCOUNTER — Ambulatory Visit (INDEPENDENT_AMBULATORY_CARE_PROVIDER_SITE_OTHER): Payer: 59 | Admitting: Family Medicine

## 2013-10-22 VITALS — BP 132/76 | HR 81 | Temp 98.0°F | Resp 16 | Ht 65.0 in | Wt 255.0 lb

## 2013-10-22 DIAGNOSIS — E119 Type 2 diabetes mellitus without complications: Secondary | ICD-10-CM

## 2013-10-22 DIAGNOSIS — E890 Postprocedural hypothyroidism: Secondary | ICD-10-CM

## 2013-10-22 DIAGNOSIS — I10 Essential (primary) hypertension: Secondary | ICD-10-CM

## 2013-10-22 DIAGNOSIS — E559 Vitamin D deficiency, unspecified: Secondary | ICD-10-CM

## 2013-10-22 DIAGNOSIS — Z79899 Other long term (current) drug therapy: Secondary | ICD-10-CM

## 2013-10-22 LAB — COMPREHENSIVE METABOLIC PANEL
ALT: 40 U/L — AB (ref 0–35)
AST: 35 U/L (ref 0–37)
Albumin: 3.8 g/dL (ref 3.5–5.2)
Alkaline Phosphatase: 80 U/L (ref 39–117)
BILIRUBIN TOTAL: 0.4 mg/dL (ref 0.2–1.2)
BUN: 15 mg/dL (ref 6–23)
CHLORIDE: 100 meq/L (ref 96–112)
CO2: 27 mEq/L (ref 19–32)
Calcium: 9.3 mg/dL (ref 8.4–10.5)
Creat: 0.66 mg/dL (ref 0.50–1.10)
Glucose, Bld: 261 mg/dL — ABNORMAL HIGH (ref 70–99)
Potassium: 3.7 mEq/L (ref 3.5–5.3)
Sodium: 139 mEq/L (ref 135–145)
Total Protein: 6.9 g/dL (ref 6.0–8.3)

## 2013-10-22 LAB — T3, FREE: T3, Free: 2.9 pg/mL (ref 2.3–4.2)

## 2013-10-22 LAB — TSH: TSH: 1.126 u[IU]/mL (ref 0.350–4.500)

## 2013-10-22 LAB — T4, FREE: Free T4: 1.15 ng/dL (ref 0.80–1.80)

## 2013-10-22 NOTE — Patient Instructions (Signed)
We will not make any changes to your medications today.   At your next visit in December, we will discuss whether you need to start a new diabetes medicine and whether we can try to stop your levothyroxine.    Diabetes and Sick Day Management Blood sugar (glucose) can be more difficult to control when you are sick. Colds, fever, flu, nausea, vomiting, and diarrhea are all examples of common illnesses that can cause problems for people with diabetes. Loss of body fluids (dehydration) from fever, vomiting, diarrhea, infection, and the stress of a sickness can all cause blood glucose levels to increase. Because of this, it is very important to take your diabetes medicines and to eat some form of carbohydrate food when you are sick. Liquid or soft foods are often tolerated, and they help to replace fluids. HOME CARE INSTRUCTIONS These main guidelines are intended for managing a short-term (24 hours or less) sickness:  Take your usual dose of insulin or oral diabetes medicine. An exception would be if you take any form of metformin. If you cannot eat or drink, you can become dehydrated and should not take this medicine.  Continue to take your insulin even if you are unable to eat solid foods or are vomiting. Your insulin dose may stay the same, or it may need to be increased when you are sick.  You will need to test your blood glucose more often, generally every 2-4 hours. If you have type 1 diabetes, test your urine for ketones every 4 hours. If you have type 2 diabetes, test your urine for ketones as directed by your health care provider.  Eat some form of food that contains carbohydrates. The carbohydrates can be in solid or liquid form. You should eat 45-50 g of carbohydrates every 3-4 hours.  Replace fluids if you have a fever, vomit, or have diarrhea. Ask your health care provider for specific rehydration instructions.  Watch carefully for the signs of ketoacidosis if you have type 1 diabetes.  Call your health care provider if any of the following symptoms are present, especially in children:  Moderate to large ketones in the urine along with a high blood glucose level.  Severe nausea.  Vomiting.  Diarrhea.  Abdominal pain.  Rapid breathing.  Drink extra liquids that do not contain sugar such as water.  Be careful with over-the-counter medicines. Read the labels. They may contain sugar or types of sugars that can increase your blood glucose level. Food Choices for Illness All of the food choices below contain about 15 g of carbohydrates. Plan ahead and keep some of these foods around.    to  cup carbonated beverage containing sugar. Carbonated beverages will usually be better tolerated if they are opened and left at room temperature for a few minutes.   of a twin frozen ice pop.   cup regular gelatin.   cup juice.   cup ice cream or frozen yogurt.   cup cooked cereal.   cup sherbet.  1 cup clear broth or soup.  1 cup cream soup.   cup regular custard.   cup regular pudding.  1 cup sports drink.  1 cup plain yogurt.  1 slice toast.  6 squares saltine crackers.  5 vanilla wafers. SEEK MEDICAL CARE IF:   You are unable to drink fluids, even small amounts.  You have nausea and vomiting for more than 6 hours.  You have diarrhea for more than 6 hours.  Your blood glucose level is more  than 240 mg/dL, even with additional insulin.  There is a change in mental status.  You develop an additional serious sickness.  You have been sick for 2 days and are not getting better.  You have a fever. SEEK IMMEDIATE MEDICAL CARE IF:  You have difficulty breathing.  You have moderate to large ketone levels. MAKE SURE YOU:  Understand these instructions.  Will watch your condition.  Will get help right away if you are not doing well or get worse. Document Released: 01/17/2003 Document Revised: 05/31/2013 Document Reviewed:  06/23/2012 [facility] Patient Information 2015 [place], [place]. This information is not intended to replace advice given to you by your health care provider. Make sure you discuss any questions you have with your health care provider. Blood Glucose Monitoring Monitoring your blood glucose (also know as blood sugar) helps you to manage your diabetes. It also helps you and your health care provider monitor your diabetes and determine how well your treatment plan is working. WHY SHOULD YOU MONITOR YOUR BLOOD GLUCOSE?  It can help you understand how food, exercise, and medicine affect your blood glucose.  It allows you to know what your blood glucose is at any given moment. You can quickly tell if you are having low blood glucose (hypoglycemia) or high blood glucose (hyperglycemia).  It can help you and your health care provider know how to adjust your medicines.  It can help you understand how to manage an illness or adjust medicine for exercise. WHEN SHOULD YOU TEST? Your health care provider will help you decide how often you should check your blood glucose. This may depend on the type of diabetes you have, your diabetes control, or the types of medicines you are taking. Be sure to write down all of your blood glucose readings so that this information can be reviewed with your health care provider. See below for examples of testing times that your health care provider may suggest. Type 1 Diabetes  Test 4 times a day if you are in good control, using an insulin pump, or perform multiple daily injections.  If your diabetes is not well controlled or if you are sick, you may need to monitor more often.  It is a good idea to also monitor:  Before and after exercise.  Between meals and 2 hours after a meal.  Occasionally between 2:00 a.m. and 3:00 a.m. Type 2 Diabetes  It can vary with each person, but generally, if you are on insulin, test 4 times a day.  If you take medicines by mouth  (orally), test 2 times a day.  If you are on a controlled diet, test once a day.  If your diabetes is not well controlled or if you are sick, you may need to monitor more often. HOW TO MONITOR YOUR BLOOD GLUCOSE Supplies Needed  Blood glucose meter.  Test strips for your meter. Each meter has its own strips. You must use the strips that go with your own meter.  A pricking needle (lancet).  A device that holds the lancet (lancing device).  A journal or log book to write down your results. Procedure  Wash your hands with soap and water. Alcohol is not preferred.  Prick the side of your finger (not the tip) with the lancet.  Gently milk the finger until a small drop of blood appears.  Follow the instructions that come with your meter for inserting the test strip, applying blood to the strip, and using your blood glucose meter. Other  Areas to Get Blood for Testing Some meters allow you to use other areas of your body (other than your finger) to test your blood. These areas are called alternative sites. The most common alternative sites are:  The forearm.  The thigh.  The back area of the lower leg.  The palm of the hand. The blood flow in these areas is slower. Therefore, the blood glucose values you get may be delayed, and the numbers are different from what you would get from your fingers. Do not use alternative sites if you think you are having hypoglycemia. Your reading will not be accurate. Always use a finger if you are having hypoglycemia. Also, if you cannot feel your lows (hypoglycemia unawareness), always use your fingers for your blood glucose checks. ADDITIONAL TIPS FOR GLUCOSE MONITORING  Do not reuse lancets.  Always carry your supplies with you.  All blood glucose meters have a 24-hour "hotline" number to call if you have questions or need help.  Adjust (calibrate) your blood glucose meter with a control solution after finishing a few boxes of strips. BLOOD  GLUCOSE RECORD KEEPING It is a good idea to keep a daily record or log of your blood glucose readings. Most glucose meters, if not all, keep your glucose records stored in the meter. Some meters come with the ability to download your records to your home computer. Keeping a record of your blood glucose readings is especially helpful if you are wanting to look for patterns. Make notes to go along with the blood glucose readings because you might forget what happened at that exact time. Keeping good records helps you and your health care provider to work together to achieve good diabetes management.  Document Released: 01/17/2003 Document Revised: 05/31/2013 Document Reviewed: 06/08/2012 [facility] Patient Information 2015 [place], [place]. This information is not intended to replace advice given to you by your health care provider. Make sure you discuss any questions you have with your health care provider.

## 2013-10-22 NOTE — Progress Notes (Signed)
Subjective:    Patient ID: Donna [name], female    DOB: 11/22/1955, 58 y.o.   MRN: [id number] This chart was scribed for Donna Knapp, MD by Cathie Hoops, ED Scribe. The patient was seen in Room 23. The patient's care was started at 4:37 PM.   10/22/2013  Chief Complaint  Patient presents with  . Thyroid check     HPI HPI Comments: Donna [name] is a 58 y.o. female who presents to the [facility] for a follow-up for her thyroid growth. Pt notes her thyroid has returned in the neck. 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 Pt denies seeing the endocrinologist. Pt notes she is taking her once per week Vitamin D. Pt took flexeril a handful of time. Pt notes she cannot take flexeril with her allergy medications. Pt notes her pain is worsened on the weekend because she tries to accomplish things on the weekend and experiences cramping in her lower extremities bilaterally. Pt notes she has some elevated blood sugars during home checks and thinks it might be due to stress. Pt notes her pre-supper blood sugar 125 (lowest) and 250 (highest) with a normal range of 140-150s. Pt notes her blood sugar has only been above 200 one time.Pt has stopped eating chocolate and stopped drinking coke zero. Pt notes she has followed up with gynecology and states on October 27 surgery at [place] has had a case of diverticulitis and just finished her medication.    [name] had a total thyroidectomy for a benign goiter. 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 She saw Dr. Constance Holster who did a biopsy of the tissue which was benign but we may be able to take pt off of her levothyroxine, now that her tissue is increasing. About 6 weeks ago,w e increased pt's metformin 500 mg bid to 1000 mg bid hemoglobin/A1C was 8. She had elevation of her Transaminases which is likely due to her fatty liver. No liver imaging  has been done. Hepatis C is negative. She has low Vitamin D, and is on prescription Vitamin D. She is trying to work on her diet and exercise to improve her cholesterol. She was having muscle cramps at night, and failed to increase water and magnesium so we tried her on some cyclobenzaprine in the evenings.  Past Medical History  Diagnosis Date  . Hypertension   . Hypothyroid   . Diverticulitis   . Arthritis   . Allergy   . Diabetes mellitus without complication    Current Outpatient Prescriptions on File Prior to Visit  Medication Sig Dispense Refill  . albuterol (PROVENTIL HFA;VENTOLIN HFA) 108 (90 BASE) MCG/ACT inhaler Inhale 2 puffs into the lungs every 4 (four) hours as needed for wheezing or shortness of breath.  1 Inhaler  11  . cyclobenzaprine (FLEXERIL) 10 MG tablet Take 1 tablet (10 mg total) by mouth at bedtime.  30 tablet  0  . ergocalciferol (VITAMIN D2) 50000 UNITS capsule Take 1 capsule (50,000 Units total) by mouth once a week.  12 capsule  1  . Fluocinolone Acetonide 0.01 % OIL Place 5 drops in ear(s) 2 (two) times daily. X 1 wk  20 mL  1  . hydrOXYzine (ATARAX/VISTARIL) 25 MG tablet Take 0.5-1 tablets (12.5-25 mg total) by mouth every 8 (eight) hours as needed for itching.  30 tablet  2  .  ibuprofen (ADVIL,MOTRIN) 800 MG tablet Take 800 mg by mouth every 8 (eight) hours as needed. For pain      . ipratropium (ATROVENT) 0.06 % nasal spray Place 2 sprays into the nose 3 (three) times daily.  15 mL  0  . levocetirizine (XYZAL) 5 MG tablet Take 1 tablet (5 mg total) by mouth every evening.  30 tablet  5  . levothyroxine (SYNTHROID, LEVOTHROID) 25 MCG tablet Take 1 tablet (25 mcg total) by mouth daily.  90 tablet  1  . losartan-hydrochlorothiazide (HYZAAR) 50-12.5 MG per tablet Take 1 tablet by mouth daily.  90 tablet  1  . metFORMIN (GLUCOPHAGE) 1000 MG tablet Take 1 tablet (1,000 mg total) by mouth 2 (two) times daily with a meal.  180 tablet  1  . metoprolol (LOPRESSOR) 50 MG  tablet Take 1 tablet (50 mg total) by mouth 2 (two) times daily.  180 tablet  1  . pantoprazole (PROTONIX) 40 MG tablet Take 1 tablet (40 mg total) by mouth daily.  90 tablet  1  . potassium chloride (K-DUR,KLOR-CON) 10 MEQ tablet Take 1 tablet (10 mEq total) by mouth daily.  90 tablet  1  . ranitidine (ZANTAC) 150 MG tablet Take 1 tablet (150 mg total) by mouth 2 (two) times daily.  180 tablet  1  . traMADol (ULTRAM) 50 MG tablet Take 1 tablet (50 mg total) by mouth every 8 (eight) hours as needed for moderate pain. For pain  60 tablet  5  . ciprofloxacin (CIPRO) 500 MG tablet Take 1 tablet (500 mg total) by mouth 2 (two) times daily.  14 tablet  1  . magnesium citrate SOLN Take 296 mLs (1 Bottle total) by mouth once.  195 mL  1  . metroNIDAZOLE (FLAGYL) 500 MG tablet Take 1 tablet (500 mg total) by mouth 3 (three) times daily.  21 tablet  1   No current facility-administered medications on file prior to visit.   Allergies  Allergen Reactions  . Glimepiride Other (See Comments)    Per patient causes severe gas pain  . Morphine And Related Nausea And Vomiting  . Colcrys [Colchicine] Rash  . Lisinopril Rash    Review of Systems  Constitutional: Negative for fever and chills.  Respiratory: Negative for shortness of breath.   Gastrointestinal: Negative for nausea and vomiting.  Musculoskeletal: Positive for arthralgias.  Neurological: Negative for weakness.   Objective:  Triage Vitals: BP 132/76  Pulse 81  Temp(Src) 98 F (36.7 C) (Oral)  Resp 16  Ht 5\' 5"  (1.651 m)  Wt 255 lb (115.667 kg)  BMI 42.43 kg/m2  SpO2 97%  Physical Exam  Nursing note and vitals reviewed. Constitutional: She is oriented to person, place, and time. She appears well-developed and well-nourished. No distress.  HENT:  Head: Normocephalic and atraumatic.  Eyes: Conjunctivae and EOM are normal.  Neck: Neck supple. No tracheal deviation present.  Cardiovascular: Normal rate, regular rhythm and normal  heart sounds.   Pulmonary/Chest: Effort normal and breath sounds normal. No respiratory distress.  Musculoskeletal: Normal range of motion.  Neurological: She is alert and oriented to person, place, and time.  Skin: Skin is warm and dry.  Psychiatric: She has a normal mood and affect. Her behavior is normal.    Assessment & Plan:  4:54 PM- Patient informed of current plan for treatment and evaluation and agrees with plan at this time.  Unspecified vitamin D deficiency - cont on rx replacement, recheck at f/u  Type  II or unspecified type diabetes mellitus without mention of complication, not stated as uncontrolled - cont to work on [place] in 6-12 wks with additional medications other than metformin alone if a1c >7.  Postoperative hypothyroidism - Plan: TSH, T3, Free, T4, Free - cont levothyroxine 25 mcg daily for now but will likely be able to stop levothyroxine since part of thyroid regenerated.  Consider endocrine eval to discuss this.  Essential hypertension, benign  Encounter for long-term (current) use of other medications - Plan: Comprehensive metabolic panel  Leg cramps - improving on qhs flexeril, cont mag supp and push fluids  HM - Pt plans to do colonoscopy in early 2016.  Elevated transaminases - assumed to be due to fatty liver but should get abd [place] if continues. Hep C neg.  HPL - cont to work on [place] - recheck at f/u and cons starting statin.  I personally performed the services described in this documentation, which was scribed in my presence. The recorded information has been reviewed and considered, and addended by me as needed.  [name], MD MPH  Results for orders placed in visit on 10/22/13  COMPREHENSIVE METABOLIC PANEL      Result Value Ref Range   Sodium 139  135 - 145 mEq/L   Potassium 3.7  3.5 - 5.3 mEq/L   Chloride 100  96 - 112 mEq/L   CO2 27  19 - 32 mEq/L   Glucose, Bld 261 (*) 70 - 99 mg/dL   BUN 15  6 - 23 mg/dL   Creat 0.66  0.50 - 1.10 mg/dL    Total Bilirubin 0.4  0.2 - 1.2 mg/dL   Alkaline Phosphatase 80  39 - 117 U/L   AST 35  0 - 37 U/L   ALT 40 (*) 0 - 35 U/L   Total Protein 6.9  6.0 - 8.3 g/dL   Albumin 3.8  3.5 - 5.2 g/dL   Calcium 9.3  8.4 - 10.5 mg/dL  TSH      Result Value Ref Range   TSH 1.126  0.350 - 4.500 uIU/mL  T3, FREE      Result Value Ref Range   T3, Free 2.9  2.3 - 4.2 pg/mL  T4, FREE      Result Value Ref Range   Free T4 1.15  0.80 - 1.80 ng/dL

## 2013-10-28 DIAGNOSIS — C541 Malignant neoplasm of endometrium: Secondary | ICD-10-CM

## 2013-10-28 HISTORY — DX: Malignant neoplasm of endometrium: C54.1

## 2013-11-09 NOTE — Progress Notes (Signed)
Please put orders in Epic surgery 11-19-13 pre op 11-19-13 Thanks

## 2013-11-12 ENCOUNTER — Encounter: Payer: Self-pay | Admitting: Radiology

## 2013-11-12 DIAGNOSIS — E890 Postprocedural hypothyroidism: Secondary | ICD-10-CM

## 2013-11-12 DIAGNOSIS — E049 Nontoxic goiter, unspecified: Secondary | ICD-10-CM

## 2013-11-18 NOTE — Patient Instructions (Addendum)
Donna [name]  11/18/2013                           YOUR PROCEDURE IS SCHEDULED ON:  11/23/13                ENTER FROM [address] - ENTER THRU EMERGENCY ENTRANCE                                          FOLLOW  SIGNS TO SHORT STAY CENTER                 ARRIVE AT SHORT STAY AT: 5:15 AM               CALL THIS NUMBER IF ANY PROBLEMS THE DAY OF SURGERY :               [phone]                                REMEMBER:   Do not eat food or drink liquids AFTER MIDNIGHT               CLEAR LIQUIDS ONLY THE DAY BEFORE SURGERY      CLEAR LIQUID DIET   Foods Allowed                                                                     Foods Excluded  Coffee and tea, regular and decaf                             liquids that you cannot  Plain Jell-O in any flavor                                             see through such as: Fruit ices (not with fruit pulp)                                     milk, soups, orange juice  Iced Popsicles                                    All solid food Carbonated beverages, regular and diet                                    Cranberry, grape and apple juices Sports drinks like Gatorade Lightly seasoned clear broth or consume(fat free) Sugar, honey syrup  _____________________________________________________________________                Take these medicines the morning of surgery with               A SIPS OF  WATER :   METOPROLOL / LEVOTHYROXINE / PROTONIX / MAY TAKE TRAMADOL IF NEEDED / MAY USE ALBUTEROL IF NEEDED   Do not wear jewelry, make-up   Do not wear lotions, powders, or perfumes.   Do not shave legs or underarms 12 hrs. before surgery (men may shave face)  Do not bring valuables to the hospital.  Contacts, dentures or bridgework may not be worn into surgery.  Leave suitcase in the car. After surgery it may be brought to your room.  For patients admitted to the hospital more than one night, checkout time is             11:00 AM                                                       The day of discharge.   Patients discharged the day of surgery will not be allowed to drive home.            If going home same day of surgery, must have someone stay with you              FIRST 24 hrs at home and arrange for some one to drive you              home from hospital.   ________________________________________________________________________                                                                                                  [place]  Before surgery, you can play an important role.  Because skin is not sterile, your skin needs to be as free of germs as possible.  You can reduce the number of germs on your skin by washing with CHG (chlorahexidine gluconate) soap before surgery.  CHG is an antiseptic cleaner which kills germs and bonds with the skin to continue killing germs even after washing. Please DO NOT use if you have an allergy to CHG or antibacterial soaps.  If your skin becomes reddened/irritated stop using the CHG and inform your nurse when you arrive at Short Stay. Do not shave (including legs and underarms) for at least 48 hours prior to the first CHG shower.  You may shave your face. Please follow these instructions carefully:   1.  Shower with CHG Soap the night before surgery and the  morning of Surgery.   2.  If you choose to wash your hair, wash your hair first as usual with your  normal  Shampoo.   3.  After you shampoo, rinse your hair and body thoroughly to remove the  shampoo.                                         4.  Use CHG as you would any other liquid soap.  You can apply chg directly  to the skin and wash . Gently wash with scrungie or clean wascloth    5.  Apply the CHG Soap to your body ONLY FROM THE NECK DOWN.   Do not use on open                           Wound or open sores. Avoid contact with eyes, ears mouth and genitals (private parts).                         Genitals (private parts) with your normal soap.              6.  Wash thoroughly, paying special attention to the area where your surgery  will be performed.   7.  Thoroughly rinse your body with warm water from the neck down.   8.  DO NOT shower/wash with your normal soap after using and rinsing off  the CHG Soap .                9.  Pat yourself dry with a clean towel.             10.  Wear clean pajamas.             11.  Place clean sheets on your bed the night of your first shower and do not  sleep with pets.  Day of Surgery : Do not apply any lotions/deodorants the morning of surgery.  Please wear clean clothes to the hospital/surgery center.  FAILURE TO FOLLOW THESE INSTRUCTIONS MAY RESULT IN THE CANCELLATION OF YOUR SURGERY    PATIENT SIGNATURE_________________________________  ______________________________________________________________________     Donna [name]  An incentive spirometer is a tool that can help keep your lungs clear and active. This tool measures how well you are filling your lungs with each breath. Taking long deep breaths may help reverse or decrease the chance of developing breathing (pulmonary) problems (especially infection) following:  A long period of time when you are unable to move or be active. BEFORE THE PROCEDURE   If the spirometer includes an indicator to show your best effort, your nurse or respiratory therapist will set it to a desired goal.  If possible, sit up straight or lean slightly forward. Try not to slouch.  Hold the incentive spirometer in an upright position. INSTRUCTIONS FOR USE  1. Sit on the edge of your bed if possible, or sit up as far as you can in bed or on a chair. 2. Hold the incentive spirometer in an upright position. 3. Breathe out normally. 4. Place the mouthpiece in your mouth and seal your lips tightly around it. 5. Breathe in slowly and as deeply as possible, raising the  piston or the ball toward the top of the column. 6. Hold your breath for 3-5 seconds or for as long as possible. Allow the piston or ball to fall to the bottom of the column. 7. Remove the mouthpiece from your mouth and breathe out normally. 8. Rest for a few seconds and repeat Steps 1 through 7 at least 10 times every 1-2 hours when you are awake. Take your time and take a few normal breaths between deep breaths. 9. The spirometer may include an indicator to show your best effort. Use the indicator as a goal to work toward during each repetition. 10. After each set of 10 deep breaths, practice  coughing to be sure your lungs are clear. If you have an incision (the cut made at the time of surgery), support your incision when coughing by placing a pillow or rolled up towels firmly against it. Once you are able to get out of bed, walk around indoors and cough well. You may stop using the incentive spirometer when instructed by your caregiver.  RISKS AND COMPLICATIONS  Take your time so you do not get dizzy or light-headed.  If you are in pain, you may need to take or ask for pain medication before doing incentive spirometry. It is harder to take a deep breath if you are having pain. AFTER USE  Rest and breathe slowly and easily.  It can be helpful to keep track of a log of your progress. Your caregiver can provide you with a simple table to help with this. If you are using the spirometer at home, follow these instructions: [place] IF:   You are having difficultly using the spirometer.  You have trouble using the spirometer as often as instructed.  Your pain medication is not giving enough relief while using the spirometer.  You develop fever of 100.5 F (38.1 C) or higher. SEEK IMMEDIATE MEDICAL CARE IF:   You cough up bloody sputum that had not been present before.  You develop fever of 102 F (38.9 C) or greater.  You develop worsening pain at or near the incision  site. MAKE SURE YOU:   Understand these instructions.  Will watch your condition.  Will get help right away if you are not doing well or get worse. Document Released: 05/27/2006 Document Revised: 04/08/2011 Document Reviewed: 07/28/2006 Exit[employer] Patient Information 2014 [employer], [place].   ________________________________________________________________________  WHAT IS A BLOOD TRANSFUSION? Blood Transfusion Information  A transfusion is the replacement of blood or some of its parts. Blood is made up of multiple cells which provide different functions.  Red blood cells carry oxygen and are used for blood loss replacement.  White blood cells fight against infection.  Platelets control bleeding.  Plasma helps clot blood.  Other blood products are available for specialized needs, such as hemophilia or other clotting disorders. BEFORE THE TRANSFUSION  Who gives blood for transfusions?   Healthy volunteers who are fully evaluated to make sure their blood is safe. This is blood bank blood. Transfusion therapy is the safest it has ever been in the practice of medicine. Before blood is taken from a donor, a complete history is taken to make sure that person has no history of diseases nor engages in risky social behavior (examples are intravenous drug use or sexual activity with multiple partners). The donor's travel history is screened to minimize risk of transmitting infections, such as malaria. The donated blood is tested for signs of infectious diseases, such as HIV and hepatitis. The blood is then tested to be sure it is compatible with you in order to minimize the chance of a transfusion reaction. If you or a relative donates blood, this is often done in anticipation of surgery and is not appropriate for emergency situations. It takes many days to process the donated blood. RISKS AND COMPLICATIONS Although transfusion therapy is very safe and saves many lives, the main dangers of  transfusion include:   Getting an infectious disease.  Developing a transfusion reaction. This is an allergic reaction to something in the blood you were given. Every precaution is taken to prevent this. The decision to have a blood transfusion has been considered  carefully by your caregiver before blood is given. Blood is not given unless the benefits outweigh the risks. AFTER THE TRANSFUSION  Right after receiving a blood transfusion, you will usually feel much better and more energetic. This is especially true if your red blood cells have gotten low (anemic). The transfusion raises the level of the red blood cells which carry oxygen, and this usually causes an energy increase.  The nurse administering the transfusion will monitor you carefully for complications. HOME CARE INSTRUCTIONS  No special instructions are needed after a transfusion. You may find your energy is better. Speak with your caregiver about any limitations on activity for underlying diseases you may have. SEEK MEDICAL CARE IF:   Your condition is not improving after your transfusion.  You develop redness or irritation at the intravenous (IV) site. SEEK IMMEDIATE MEDICAL CARE IF:  Any of the following symptoms occur over the next 12 hours:  Shaking chills.  You have a temperature by mouth above 102 F (38.9 C), not controlled by medicine.  Chest, back, or muscle pain.  People around you feel you are not acting correctly or are confused.  Shortness of breath or difficulty breathing.  Dizziness and fainting.  You get a rash or develop hives.  You have a decrease in urine output.  Your urine turns a dark color or changes to pink, red, or brown. Any of the following symptoms occur over the next 10 days:  You have a temperature by mouth above 102 F (38.9 C), not controlled by medicine.  Shortness of breath.  Weakness after normal activity.  The white part of the eye turns yellow (jaundice).  You have a  decrease in the amount of urine or are urinating less often.  Your urine turns a dark color or changes to pink, red, or brown. Document Released: 01/12/2000 Document Revised: 04/08/2011 Document Reviewed: 08/31/2007 [facility] Patient Information 2014 [place], [place].  _______________________________________________________________________

## 2013-11-19 ENCOUNTER — Encounter (INDEPENDENT_AMBULATORY_CARE_PROVIDER_SITE_OTHER): Payer: Self-pay

## 2013-11-19 ENCOUNTER — Encounter (HOSPITAL_COMMUNITY): Payer: Self-pay | Admitting: Pharmacy Technician

## 2013-11-19 ENCOUNTER — Encounter (HOSPITAL_COMMUNITY): Payer: Self-pay

## 2013-11-19 ENCOUNTER — Encounter (HOSPITAL_COMMUNITY)
Admission: RE | Admit: 2013-11-19 | Discharge: 2013-11-19 | Disposition: A | Discharge status: Home / Self Care | Payer: 59 | Source: Ambulatory Visit | Attending: Gynecologic Oncology | Admitting: Gynecologic Oncology

## 2013-11-19 ENCOUNTER — Other Ambulatory Visit: Payer: Self-pay

## 2013-11-19 ENCOUNTER — Ambulatory Visit (HOSPITAL_COMMUNITY)
Admission: RE | Admit: 2013-11-19 | Discharge: 2013-11-19 | Disposition: A | Discharge status: Home / Self Care | Payer: 59 | Source: Ambulatory Visit | Attending: Gynecologic Oncology | Admitting: Gynecologic Oncology

## 2013-11-19 DIAGNOSIS — J40 Bronchitis, not specified as acute or chronic: Secondary | ICD-10-CM | POA: Insufficient documentation

## 2013-11-19 DIAGNOSIS — K219 Gastro-esophageal reflux disease without esophagitis: Secondary | ICD-10-CM | POA: Insufficient documentation

## 2013-11-19 DIAGNOSIS — Z01810 Encounter for preprocedural cardiovascular examination: Secondary | ICD-10-CM | POA: Insufficient documentation

## 2013-11-19 DIAGNOSIS — I517 Cardiomegaly: Secondary | ICD-10-CM | POA: Diagnosis not present

## 2013-11-19 DIAGNOSIS — Z01812 Encounter for preprocedural laboratory examination: Secondary | ICD-10-CM | POA: Diagnosis not present

## 2013-11-19 DIAGNOSIS — C439 Malignant melanoma of skin, unspecified: Secondary | ICD-10-CM | POA: Insufficient documentation

## 2013-11-19 DIAGNOSIS — Z87891 Personal history of nicotine dependence: Secondary | ICD-10-CM | POA: Diagnosis not present

## 2013-11-19 DIAGNOSIS — C541 Malignant neoplasm of endometrium: Secondary | ICD-10-CM | POA: Diagnosis not present

## 2013-11-19 DIAGNOSIS — E119 Type 2 diabetes mellitus without complications: Secondary | ICD-10-CM | POA: Diagnosis not present

## 2013-11-19 HISTORY — DX: Gastro-esophageal reflux disease without esophagitis: K21.9

## 2013-11-19 HISTORY — DX: Personal history of other diseases of the respiratory system: Z87.09

## 2013-11-19 HISTORY — DX: Malignant melanoma of skin, unspecified: C43.9

## 2013-11-19 HISTORY — DX: Malignant neoplasm of endometrium: C54.1

## 2013-11-19 HISTORY — DX: Personal history of other diseases of the musculoskeletal system and connective tissue: Z87.39

## 2013-11-19 HISTORY — DX: Sleep disorder, unspecified: G47.9

## 2013-11-19 LAB — URINALYSIS, ROUTINE W REFLEX MICROSCOPIC
Bilirubin Urine: NEGATIVE
Glucose, UA: 500 mg/dL — AB
HGB URINE DIPSTICK: NEGATIVE
Ketones, ur: NEGATIVE mg/dL
Leukocytes, UA: NEGATIVE
Nitrite: NEGATIVE
Protein, ur: NEGATIVE mg/dL
Specific Gravity, Urine: 1.027 (ref 1.005–1.030)
UROBILINOGEN UA: 0.2 mg/dL (ref 0.0–1.0)
pH: 5 (ref 5.0–8.0)

## 2013-11-19 LAB — COMPREHENSIVE METABOLIC PANEL
ALT: 43 U/L — AB (ref 0–35)
AST: 44 U/L — ABNORMAL HIGH (ref 0–37)
Albumin: 3.6 g/dL (ref 3.5–5.2)
Alkaline Phosphatase: 103 U/L (ref 39–117)
Anion gap: 14 (ref 5–15)
BUN: 16 mg/dL (ref 6–23)
CALCIUM: 9.1 mg/dL (ref 8.4–10.5)
CO2: 25 meq/L (ref 19–32)
CREATININE: 0.6 mg/dL (ref 0.50–1.10)
Chloride: 100 mEq/L (ref 96–112)
GFR calc non Af Amer: >90 mL/min (ref >90)
GLUCOSE: 195 mg/dL — AB (ref 70–99)
Potassium: 3.7 mEq/L (ref 3.7–5.3)
Sodium: 139 mEq/L (ref 137–147)
Total Bilirubin: 0.4 mg/dL (ref 0.3–1.2)
Total Protein: 7.5 g/dL (ref 6.0–8.3)

## 2013-11-19 LAB — CBC WITH DIFFERENTIAL/PLATELET
Basophils Absolute: 0.1 10*3/uL (ref 0.0–0.1)
Basophils Relative: 1 % (ref 0–1)
Eosinophils Absolute: 0.3 10*3/uL (ref 0.0–0.7)
Eosinophils Relative: 3 % (ref 0–5)
HEMATOCRIT: 43 % (ref 36.0–46.0)
Hemoglobin: 14.3 g/dL (ref 12.0–15.0)
LYMPHS ABS: 3.3 10*3/uL (ref 0.7–4.0)
LYMPHS PCT: 32 % (ref 12–46)
MCH: 29.1 pg (ref 26.0–34.0)
MCHC: 33.3 g/dL (ref 30.0–36.0)
MCV: 87.4 fL (ref 78.0–100.0)
MONO ABS: 0.6 10*3/uL (ref 0.1–1.0)
Monocytes Relative: 6 % (ref 3–12)
Neutro Abs: 6 10*3/uL (ref 1.7–7.7)
Neutrophils Relative %: 58 % (ref 43–77)
Platelets: 365 10*3/uL (ref 150–400)
RBC: 4.92 MIL/uL (ref 3.87–5.11)
RDW: 12.5 % (ref 11.5–15.5)
WBC: 10.2 10*3/uL (ref 4.0–10.5)

## 2013-11-19 LAB — ABO/RH: ABO/RH(D): O POS

## 2013-11-19 NOTE — Progress Notes (Signed)
11/19/13 1443  OBSTRUCTIVE SLEEP APNEA  Have you ever been diagnosed with sleep apnea through a sleep study? No  Do you snore loudly (loud enough to be heard through closed doors)?  0  Do you often feel tired, fatigued, or sleepy during the daytime? 1  Has anyone observed you stop breathing during your sleep? 0  Do you have, or are you being treated for high blood pressure? 1  BMI more than 35 kg/m2? 1  Age over 57 years old? 1  Neck circumference greater than 40 cm/16 inches? 1  Gender: 0  Obstructive Sleep Apnea Score 5  Score 4 or greater  Results sent to PCP

## 2013-11-20 ENCOUNTER — Encounter: Payer: Self-pay | Admitting: Family Medicine

## 2013-11-22 ENCOUNTER — Telehealth: Payer: Self-pay | Admitting: Gynecologic Oncology

## 2013-11-22 NOTE — Telephone Encounter (Signed)
Telephone call to check on pre-operative status.  Patient complaint with pre-operative instructions.  Reinforced NPO after midnight.  No questions or concerns voiced.  Instructed to call for any needs. Pt requesting to have her stitches removed from a recent skin biopsy on her back.  Dr. Denman George notified.

## 2013-11-22 NOTE — Anesthesia Preprocedure Evaluation (Addendum)
Anesthesia Evaluation  Patient identified by MRN, date of birth, ID band Patient awake    Reviewed: Allergy & Precautions, H&P , NPO status , Patient's Chart, lab work & pertinent test results, reviewed documented beta blocker date and time   Airway Mallampati: II  TM Distance: >3 FB Neck ROM: full    Dental  (+) Missing, Dental Advisory Given Missing right upper side.  Rotten tooth left upper lateral incisor:   Pulmonary neg pulmonary ROS, former smoker,  History bronchitis breath sounds clear to auscultation  Pulmonary exam normal       Cardiovascular Exercise Tolerance: Good hypertension, Pt. on medications and Pt. on home beta blockers Rhythm:regular Rate:Normal     Neuro/Psych negative neurological ROS  negative psych ROS   GI/Hepatic negative GI ROS, Neg liver ROS, GERD-  Medicated and Controlled,  Endo/Other  diabetes, Well Controlled, Type 2, Oral Hypoglycemic AgentsHypothyroidism Morbid obesity  Renal/GU negative Renal ROS  negative genitourinary   Musculoskeletal   Abdominal (+) + obese,   Peds  Hematology negative hematology ROS (+)   Anesthesia Other Findings   Reproductive/Obstetrics negative OB ROS                            Anesthesia Physical Anesthesia Plan  ASA: III  Anesthesia Plan: General   Post-op Pain Management:    Induction: Intravenous  Airway Management Planned: Oral ETT  Additional Equipment:   Intra-op Plan:   Post-operative Plan: Extubation in OR  Informed Consent: I have reviewed the patients History and Physical, chart, labs and discussed the procedure including the risks, benefits and alternatives for the proposed anesthesia with the patient or authorized representative who has indicated his/her understanding and acceptance.   Dental Advisory Given  Plan Discussed with: CRNA and Surgeon  Anesthesia Plan Comments:         Anesthesia  Quick Evaluation

## 2013-11-23 ENCOUNTER — Ambulatory Visit (HOSPITAL_COMMUNITY)
Admission: RE | Admit: 2013-11-23 | Discharge: 2013-11-24 | Disposition: A | Discharge status: Home / Self Care | Payer: 59 | Source: Ambulatory Visit | Attending: Obstetrics & Gynecology | Admitting: Obstetrics & Gynecology

## 2013-11-23 ENCOUNTER — Encounter (HOSPITAL_COMMUNITY): Payer: Self-pay | Admitting: *Deleted

## 2013-11-23 ENCOUNTER — Encounter (HOSPITAL_COMMUNITY): Payer: 59 | Admitting: Anesthesiology

## 2013-11-23 ENCOUNTER — Ambulatory Visit (HOSPITAL_COMMUNITY): Payer: 59 | Admitting: Anesthesiology

## 2013-11-23 ENCOUNTER — Encounter (HOSPITAL_COMMUNITY)
Admission: RE | Disposition: A | Discharge status: Home / Self Care | Payer: Self-pay | Source: Ambulatory Visit | Attending: Obstetrics & Gynecology

## 2013-11-23 DIAGNOSIS — N800 Endometriosis of uterus: Secondary | ICD-10-CM | POA: Insufficient documentation

## 2013-11-23 DIAGNOSIS — K660 Peritoneal adhesions (postprocedural) (postinfection): Secondary | ICD-10-CM | POA: Diagnosis not present

## 2013-11-23 DIAGNOSIS — D259 Leiomyoma of uterus, unspecified: Secondary | ICD-10-CM | POA: Insufficient documentation

## 2013-11-23 DIAGNOSIS — M1990 Unspecified osteoarthritis, unspecified site: Secondary | ICD-10-CM | POA: Insufficient documentation

## 2013-11-23 DIAGNOSIS — E039 Hypothyroidism, unspecified: Secondary | ICD-10-CM | POA: Insufficient documentation

## 2013-11-23 DIAGNOSIS — Z87891 Personal history of nicotine dependence: Secondary | ICD-10-CM | POA: Insufficient documentation

## 2013-11-23 DIAGNOSIS — E119 Type 2 diabetes mellitus without complications: Secondary | ICD-10-CM | POA: Diagnosis not present

## 2013-11-23 DIAGNOSIS — C541 Malignant neoplasm of endometrium: Secondary | ICD-10-CM | POA: Insufficient documentation

## 2013-11-23 DIAGNOSIS — I10 Essential (primary) hypertension: Secondary | ICD-10-CM | POA: Insufficient documentation

## 2013-11-23 HISTORY — PX: ROBOTIC ASSISTED TOTAL HYSTERECTOMY WITH BILATERAL SALPINGO OOPHERECTOMY: SHX6086

## 2013-11-23 HISTORY — PX: SUTURE REMOVAL: SHX6354

## 2013-11-23 LAB — TYPE AND SCREEN
ABO/RH(D): O POS
ANTIBODY SCREEN: NEGATIVE

## 2013-11-23 LAB — GLUCOSE, CAPILLARY
GLUCOSE-CAPILLARY: 277 mg/dL — AB (ref 70–99)
Glucose-Capillary: 256 mg/dL — ABNORMAL HIGH (ref 70–99)
Glucose-Capillary: 294 mg/dL — ABNORMAL HIGH (ref 70–99)
Glucose-Capillary: 329 mg/dL — ABNORMAL HIGH (ref 70–99)
Glucose-Capillary: 248 mg/dL — ABNORMAL HIGH (ref 70–99)

## 2013-11-23 SURGERY — ROBOTIC ASSISTED TOTAL HYSTERECTOMY WITH BILATERAL SALPINGO OOPHORECTOMY
Anesthesia: General | Site: Back

## 2013-11-23 MED ORDER — HYDROMORPHONE HCL 2 MG/ML IJ SOLN
INTRAMUSCULAR | Status: AC
  Filled 2013-11-23: qty 1

## 2013-11-23 MED ORDER — HYDROMORPHONE HCL 1 MG/ML IJ SOLN: 0.5000 mg | INTRAMUSCULAR | Status: DC | PRN

## 2013-11-23 MED ORDER — ONDANSETRON HCL 4 MG PO TABS: 4.0000 mg | ORAL_TABLET | Freq: Four times a day (QID) | ORAL | Status: DC | PRN

## 2013-11-23 MED ORDER — METOPROLOL TARTRATE 50 MG PO TABS
50.0000 mg | ORAL_TABLET | Freq: Two times a day (BID) | ORAL | Status: DC
  Administered 2013-11-23 – 2013-11-24 (×2): 50 mg via ORAL
  Filled 2013-11-23 (×3): qty 1

## 2013-11-23 MED ORDER — HYDROMORPHONE HCL 1 MG/ML IJ SOLN
INTRAMUSCULAR | Status: DC | PRN
  Administered 2013-11-23: 1 mg via INTRAVENOUS

## 2013-11-23 MED ORDER — LOSARTAN POTASSIUM 50 MG PO TABS
50.0000 mg | ORAL_TABLET | Freq: Every day | ORAL | Status: DC
  Administered 2013-11-24: 50 mg via ORAL
  Filled 2013-11-23: qty 1

## 2013-11-23 MED ORDER — DEXAMETHASONE SODIUM PHOSPHATE 10 MG/ML IJ SOLN
INTRAMUSCULAR | Status: DC | PRN
  Administered 2013-11-23: 10 mg via INTRAVENOUS

## 2013-11-23 MED ORDER — NEOSTIGMINE METHYLSULFATE 10 MG/10ML IV SOLN
INTRAVENOUS | Status: AC
  Filled 2013-11-23: qty 1

## 2013-11-23 MED ORDER — MIDAZOLAM HCL 5 MG/5ML IJ SOLN
INTRAMUSCULAR | Status: DC | PRN
  Administered 2013-11-23: 2 mg via INTRAVENOUS

## 2013-11-23 MED ORDER — DEXAMETHASONE SODIUM PHOSPHATE 10 MG/ML IJ SOLN
INTRAMUSCULAR | Status: AC
  Filled 2013-11-23: qty 1

## 2013-11-23 MED ORDER — GLYCOPYRROLATE 0.2 MG/ML IJ SOLN
INTRAMUSCULAR | Status: DC | PRN
  Administered 2013-11-23: 0.6 mg via INTRAVENOUS

## 2013-11-23 MED ORDER — CEFAZOLIN SODIUM-DEXTROSE 2-3 GM-% IV SOLR
INTRAVENOUS | Status: AC
  Filled 2013-11-23: qty 50

## 2013-11-23 MED ORDER — GLYCOPYRROLATE 0.2 MG/ML IJ SOLN
INTRAMUSCULAR | Status: AC
  Filled 2013-11-23: qty 3

## 2013-11-23 MED ORDER — SUCCINYLCHOLINE CHLORIDE 20 MG/ML IJ SOLN
INTRAMUSCULAR | Status: DC | PRN
  Administered 2013-11-23: 100 mg via INTRAVENOUS

## 2013-11-23 MED ORDER — KCL IN DEXTROSE-NACL 20-5-0.45 MEQ/L-%-% IV SOLN
INTRAVENOUS | Status: DC
  Filled 2013-11-23 (×2): qty 1000

## 2013-11-23 MED ORDER — ALBUTEROL SULFATE HFA 108 (90 BASE) MCG/ACT IN AERS: 2.0000 | INHALATION_SPRAY | RESPIRATORY_TRACT | Status: DC | PRN

## 2013-11-23 MED ORDER — LACTATED RINGERS IR SOLN
Status: DC | PRN
  Administered 2013-11-23: 1000 mL

## 2013-11-23 MED ORDER — LEVOTHYROXINE SODIUM 25 MCG PO TABS
25.0000 ug | ORAL_TABLET | Freq: Every day | ORAL | Status: DC
  Administered 2013-11-24: 25 ug via ORAL
  Filled 2013-11-23 (×2): qty 1

## 2013-11-23 MED ORDER — POTASSIUM CHLORIDE IN NACL 20-0.45 MEQ/L-% IV SOLN
INTRAVENOUS | Status: DC
  Administered 2013-11-23 – 2013-11-24 (×2): via INTRAVENOUS
  Filled 2013-11-23 (×4): qty 1000

## 2013-11-23 MED ORDER — ONDANSETRON HCL 4 MG/2ML IJ SOLN
INTRAMUSCULAR | Status: AC
  Filled 2013-11-23: qty 2

## 2013-11-23 MED ORDER — HYDROMORPHONE HCL 1 MG/ML IJ SOLN: 0.2500 mg | INTRAMUSCULAR | Status: DC | PRN

## 2013-11-23 MED ORDER — STERILE WATER FOR IRRIGATION IR SOLN
Status: DC | PRN
  Administered 2013-11-23: 3000 mL

## 2013-11-23 MED ORDER — HYDROCHLOROTHIAZIDE 12.5 MG PO CAPS
12.5000 mg | ORAL_CAPSULE | Freq: Every day | ORAL | Status: DC
  Administered 2013-11-24: 12.5 mg via ORAL
  Filled 2013-11-23: qty 1

## 2013-11-23 MED ORDER — ENOXAPARIN SODIUM 40 MG/0.4ML ~~LOC~~ SOLN
40.0000 mg | SUBCUTANEOUS | Status: DC
  Administered 2013-11-24: 40 mg via SUBCUTANEOUS
  Filled 2013-11-23 (×2): qty 0.4

## 2013-11-23 MED ORDER — INSULIN ASPART 100 UNIT/ML ~~LOC~~ SOLN
0.0000 [IU] | Freq: Three times a day (TID) | SUBCUTANEOUS | Status: DC
  Administered 2013-11-23: 11 [IU] via SUBCUTANEOUS
  Administered 2013-11-23 (×2): 8 [IU] via SUBCUTANEOUS

## 2013-11-23 MED ORDER — ONDANSETRON HCL 4 MG/2ML IJ SOLN
INTRAMUSCULAR | Status: DC | PRN
  Administered 2013-11-23: 4 mg via INTRAVENOUS

## 2013-11-23 MED ORDER — INSULIN ASPART 100 UNIT/ML ~~LOC~~ SOLN
0.0000 [IU] | Freq: Three times a day (TID) | SUBCUTANEOUS | Status: DC
  Administered 2013-11-24: 7 [IU] via SUBCUTANEOUS

## 2013-11-23 MED ORDER — FAMOTIDINE 20 MG PO TABS
20.0000 mg | ORAL_TABLET | Freq: Every day | ORAL | Status: DC
  Administered 2013-11-24: 20 mg via ORAL
  Filled 2013-11-23: qty 1

## 2013-11-23 MED ORDER — LIDOCAINE HCL (CARDIAC) 20 MG/ML IV SOLN
INTRAVENOUS | Status: DC | PRN
  Administered 2013-11-23: 100 mg via INTRAVENOUS

## 2013-11-23 MED ORDER — FENTANYL CITRATE 0.05 MG/ML IJ SOLN
INTRAMUSCULAR | Status: AC
  Filled 2013-11-23: qty 5

## 2013-11-23 MED ORDER — PROPOFOL 10 MG/ML IV BOLUS
INTRAVENOUS | Status: DC | PRN
  Administered 2013-11-23: 200 mg via INTRAVENOUS

## 2013-11-23 MED ORDER — ROCURONIUM BROMIDE 100 MG/10ML IV SOLN
INTRAVENOUS | Status: AC
  Filled 2013-11-23: qty 1

## 2013-11-23 MED ORDER — PANTOPRAZOLE SODIUM 40 MG PO TBEC
40.0000 mg | DELAYED_RELEASE_TABLET | Freq: Every evening | ORAL | Status: DC
Start: 2013-11-23 — End: 2013-11-24
  Administered 2013-11-23: 40 mg via ORAL
  Filled 2013-11-23 (×2): qty 1

## 2013-11-23 MED ORDER — TRAMADOL HCL 50 MG PO TABS
100.0000 mg | ORAL_TABLET | Freq: Four times a day (QID) | ORAL | Status: DC | PRN
  Administered 2013-11-24: 50 mg via ORAL
  Filled 2013-11-23: qty 2

## 2013-11-23 MED ORDER — LEVOCETIRIZINE DIHYDROCHLORIDE 5 MG PO TABS: 5.0000 mg | ORAL_TABLET | Freq: Every evening | ORAL | Status: DC

## 2013-11-23 MED ORDER — PHENYLEPHRINE 40 MCG/ML (10ML) SYRINGE FOR IV PUSH (FOR BLOOD PRESSURE SUPPORT)
PREFILLED_SYRINGE | INTRAVENOUS | Status: AC
  Filled 2013-11-23: qty 10

## 2013-11-23 MED ORDER — LORATADINE 10 MG PO TABS
10.0000 mg | ORAL_TABLET | Freq: Every day | ORAL | Status: DC
  Administered 2013-11-24: 10 mg via ORAL
  Filled 2013-11-23: qty 1

## 2013-11-23 MED ORDER — ENOXAPARIN SODIUM 40 MG/0.4ML ~~LOC~~ SOLN
40.0000 mg | SUBCUTANEOUS | Status: AC
  Administered 2013-11-23: 40 mg via SUBCUTANEOUS
  Filled 2013-11-23: qty 0.4

## 2013-11-23 MED ORDER — CEFAZOLIN SODIUM-DEXTROSE 2-3 GM-% IV SOLR
2.0000 g | INTRAVENOUS | Status: AC
  Administered 2013-11-23: 2 g via INTRAVENOUS

## 2013-11-23 MED ORDER — ONDANSETRON HCL 4 MG/2ML IJ SOLN: 4.0000 mg | Freq: Four times a day (QID) | INTRAMUSCULAR | Status: DC | PRN

## 2013-11-23 MED ORDER — INSULIN ASPART 100 UNIT/ML ~~LOC~~ SOLN
SUBCUTANEOUS | Status: AC
  Filled 2013-11-23: qty 1

## 2013-11-23 MED ORDER — PROPOFOL 10 MG/ML IV BOLUS
INTRAVENOUS | Status: AC
  Filled 2013-11-23: qty 20

## 2013-11-23 MED ORDER — LACTATED RINGERS IV SOLN: INTRAVENOUS | Status: DC

## 2013-11-23 MED ORDER — MIDAZOLAM HCL 2 MG/2ML IJ SOLN
INTRAMUSCULAR | Status: AC
  Filled 2013-11-23: qty 2

## 2013-11-23 MED ORDER — ROCURONIUM BROMIDE 100 MG/10ML IV SOLN
INTRAVENOUS | Status: DC | PRN
  Administered 2013-11-23 (×2): 5 mg via INTRAVENOUS
  Administered 2013-11-23: 40 mg via INTRAVENOUS
  Administered 2013-11-23: 20 mg via INTRAVENOUS
  Administered 2013-11-23: 10 mg via INTRAVENOUS

## 2013-11-23 MED ORDER — FENTANYL CITRATE 0.05 MG/ML IJ SOLN
INTRAMUSCULAR | Status: DC | PRN
  Administered 2013-11-23 (×2): 50 ug via INTRAVENOUS
  Administered 2013-11-23: 100 ug via INTRAVENOUS
  Administered 2013-11-23: 50 ug via INTRAVENOUS

## 2013-11-23 MED ORDER — LIDOCAINE HCL (CARDIAC) 20 MG/ML IV SOLN
INTRAVENOUS | Status: AC
  Filled 2013-11-23: qty 5

## 2013-11-23 MED ORDER — PHENYLEPHRINE HCL 10 MG/ML IJ SOLN
INTRAMUSCULAR | Status: DC | PRN
  Administered 2013-11-23: 80 ug via INTRAVENOUS

## 2013-11-23 MED ORDER — INSULIN ASPART 100 UNIT/ML ~~LOC~~ SOLN
3.0000 [IU] | Freq: Three times a day (TID) | SUBCUTANEOUS | Status: DC
  Administered 2013-11-24: 3 [IU] via SUBCUTANEOUS

## 2013-11-23 MED ORDER — OXYCODONE-ACETAMINOPHEN 5-325 MG PO TABS
1.0000 | ORAL_TABLET | ORAL | Status: DC | PRN
  Administered 2013-11-23: 2 via ORAL
  Administered 2013-11-23 – 2013-11-24 (×2): 1 via ORAL
  Filled 2013-11-23: qty 1
  Filled 2013-11-23: qty 2
  Filled 2013-11-23: qty 1

## 2013-11-23 MED ORDER — LACTATED RINGERS IV SOLN
INTRAVENOUS | Status: DC | PRN
  Administered 2013-11-23 (×3): via INTRAVENOUS

## 2013-11-23 MED ORDER — LOSARTAN POTASSIUM-HCTZ 50-12.5 MG PO TABS: 1.0000 | ORAL_TABLET | ORAL | Status: DC

## 2013-11-23 MED ORDER — ALBUTEROL SULFATE (2.5 MG/3ML) 0.083% IN NEBU: 2.5000 mg | INHALATION_SOLUTION | RESPIRATORY_TRACT | Status: DC | PRN

## 2013-11-23 MED ORDER — NEOSTIGMINE METHYLSULFATE 10 MG/10ML IV SOLN
INTRAVENOUS | Status: DC | PRN
  Administered 2013-11-23: 5 mg via INTRAVENOUS

## 2013-11-23 SURGICAL SUPPLY — 49 items
ADH SKN CLS APL DERMABOND .7 (GAUZE/BANDAGES/DRESSINGS) ×2
BAG SPEC RTRVL LRG 6X4 10 (ENDOMECHANICALS) ×4
CABLE HIGH FREQUENCY MONO STRZ (ELECTRODE) ×3 IMPLANT
CHLORAPREP W/TINT 26ML (MISCELLANEOUS) ×4 IMPLANT
CORDS BIPOLAR (ELECTRODE) ×3 IMPLANT
COVER SURGICAL LIGHT HANDLE (MISCELLANEOUS) ×3 IMPLANT
COVER TIP SHEARS 8 DVNC (MISCELLANEOUS) ×2 IMPLANT
COVER TIP SHEARS 8MM DA VINCI (MISCELLANEOUS) ×1
DERMABOND ADVANCED (GAUZE/BANDAGES/DRESSINGS) ×1
DERMABOND ADVANCED .7 DNX12 (GAUZE/BANDAGES/DRESSINGS) ×2 IMPLANT
DRAPE SHEET LG 3/4 BI-LAMINATE (DRAPES) ×6 IMPLANT
DRAPE SURG IRRIG POUCH 19X23 (DRAPES) ×3 IMPLANT
DRAPE TABLE BACK 44X90 PK DISP (DRAPES) ×6 IMPLANT
DRAPE WARM FLUID 44X44 (DRAPE) ×3 IMPLANT
DRSG TEGADERM 6X8 (GAUZE/BANDAGES/DRESSINGS) ×6 IMPLANT
ELECT REM PT RETURN 9FT ADLT (ELECTROSURGICAL) ×3
ELECTRODE REM PT RTRN 9FT ADLT (ELECTROSURGICAL) ×2 IMPLANT
GLOVE BIO SURGEON STRL SZ 6 (GLOVE) ×9 IMPLANT
GLOVE BIO SURGEON STRL SZ 6.5 (GLOVE) ×6 IMPLANT
GOWN STRL REUS W/ TWL LRG LVL4 (GOWN DISPOSABLE) ×6 IMPLANT
GOWN STRL REUS W/TWL LRG LVL3 (GOWN DISPOSABLE) ×9 IMPLANT
GOWN STRL REUS W/TWL LRG LVL4 (GOWN DISPOSABLE) ×9
HOLDER FOLEY CATH W/STRAP (MISCELLANEOUS) ×3 IMPLANT
KIT ACCESSORY DA VINCI DISP (KITS) ×1
KIT ACCESSORY DVNC DISP (KITS) ×2 IMPLANT
KIT BASIN OR (CUSTOM PROCEDURE TRAY) ×3 IMPLANT
MANIPULATOR UTERINE 4.5 ZUMI (MISCELLANEOUS) ×3 IMPLANT
OCCLUDER COLPOPNEUMO (BALLOONS) ×3 IMPLANT
PEN SKIN MARKING BROAD (MISCELLANEOUS) ×3 IMPLANT
POUCH SPECIMEN RETRIEVAL 10MM (ENDOMECHANICALS) ×2 IMPLANT
SET TUBE IRRIG SUCTION NO TIP (IRRIGATION / IRRIGATOR) ×3 IMPLANT
SHEET LAVH (DRAPES) ×3 IMPLANT
SOLUTION ANTI FOG 6CC (MISCELLANEOUS) ×3 IMPLANT
SOLUTION ELECTROLUBE (MISCELLANEOUS) ×3 IMPLANT
SUT VIC AB 0 CT1 27 (SUTURE) ×3
SUT VIC AB 0 CT1 27XBRD ANTBC (SUTURE) ×2 IMPLANT
SUT VIC AB 4-0 PS2 27 (SUTURE) ×6 IMPLANT
SUT VICRYL 0 UR6 27IN ABS (SUTURE) ×3 IMPLANT
SYR 50ML LL SCALE MARK (SYRINGE) ×3 IMPLANT
TOWEL OR 17X26 10 PK STRL BLUE (TOWEL DISPOSABLE) ×6 IMPLANT
TOWEL OR NON WOVEN STRL DISP B (DISPOSABLE) ×3 IMPLANT
TRAP SPECIMEN MUCOUS 40CC (MISCELLANEOUS) ×1 IMPLANT
TRAY FOLEY CATH 14FRSI W/METER (CATHETERS) ×3 IMPLANT
TRAY LAPAROSCOPIC (CUSTOM PROCEDURE TRAY) ×3 IMPLANT
TROCAR 12M 150ML BLUNT (TROCAR) ×3 IMPLANT
TROCAR BLADELESS OPT 5 100 (ENDOMECHANICALS) ×3 IMPLANT
TROCAR XCEL 12X100 BLDLESS (ENDOMECHANICALS) ×3 IMPLANT
TUBING INSUFFLATION 10FT LAP (TUBING) ×3 IMPLANT
WATER STERILE IRR 1500ML POUR (IV SOLUTION) ×6 IMPLANT

## 2013-11-23 NOTE — H&P (Signed)
PREOP H&P  Assessment/Plan:  Ms. RANIE CHINCHILLA is a 58 y.o. year old obese woman who has grade 1 endometrial cancer on endometrial biopsy.  A detailed discussion was held with the patient and her family with regard to to her endometrial cancer diagnosis. We discussed the standard management options for uterine cancer which includes surgery followed possibly by adjuvant therapy depending on the results of surgery. The options for surgical management include a hysterectomy and removal of the tubes and ovaries possibly with removal of pelvic and para-aortic lymph nodes. A minimally invasive approach including a robotic hysterectomy or laparoscopic hysterectomy have benefits including shorter hospital stay, recovery time and better wound healing. The alternative approach is an open hysterectomy. The patient has been counseled about these surgical options and the risks of surgery in general including infection, bleeding, damage to surrounding structures (including bowel, bladder, ureters, nerves or vessels), and the postoperative risks of PE/ DVT, and lymphedema. I extensively reviewed the additional risks of robotic hysterectomy including possible need for conversion to open laparotomy. I discussed positioning during surgery of trendelenberg and risks of minor facial swelling and care we take in preoperative positioning. After counseling and consideration of her options, she desires to proceed with robotic hysterectomy, BSO, possible lymphadenectomy.  She additionally has symptoms and physical exam findings concerning for mild diverticulitis. I am prescribing a 1 week course of cipro and flagyl for her to take orally. She is afebrile today and has no manifestations of severe systemic infection. I have also prescribed a bowel prep for her to take the day before surgery as she suffers from constipation.  She will be seen by anesthesia for preoperative clearance and discussion of postoperative pain management. She was  given the opportunity to ask questions, which were answered to her satisfaction, and she is agreement with the above mentioned plan of care.  HPI: Ms Hamblin is a 58 year old woman who is seen in consultation at the request of Dr Toney Rakes for a grade 1 endometrial cancer. The patient reports presenting for her annual gynecologic visit in August. She had no concerning symptoms, including no vaginal bleeding. She had a normal pap smear. On bimanual examination tenderness was appreciated in the left adnexa. This prompted a transvaginal US (09/10/13) which showed a 9x4.5x5.6cm uterus with a 61mm endometrial stripe. The left ovary was not visualized. The right ovary contained a 2x1x1.5cm cyst. She reports that she had a blood test which showed a mildly elevated WBC (11.3 on 09/03/13).  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  Of note she has a history of diverticulitis in the past.  Interval History: she has some loose stools.  Current Meds:  Outpatient Encounter Prescriptions as of 10/14/2013   Medication  Sig   .  albuterol (PROVENTIL HFA;VENTOLIN HFA) 108 (90 BASE) MCG/ACT inhaler  Inhale 2 puffs into the lungs every 4 (four) hours as needed for wheezing or shortness of breath.   .  ciprofloxacin (CIPRO) 500 MG tablet  Take 1 tablet (500 mg total) by mouth 2 (two) times daily.   .  cyclobenzaprine (FLEXERIL) 10 MG tablet  Take 1 tablet (10 mg total) by mouth at bedtime.   .  ergocalciferol (VITAMIN D2) 50000 UNITS capsule  Take 1 capsule (50,000 Units total) by mouth once a week.   .  Fluocinolone  Acetonide 0.01 % OIL  Place 5 drops in ear(s) 2 (two) times daily. X 1 wk   .  hydrOXYzine (ATARAX/VISTARIL) 25 MG tablet  Take 0.5-1 tablets (12.5-25 mg total) by mouth every 8 (eight)  hours as needed for itching.   Marland Kitchen  ibuprofen (ADVIL,MOTRIN) 800 MG tablet  Take 800 mg by mouth every 8 (eight) hours as needed. For pain   .  ipratropium (ATROVENT) 0.06 % nasal spray  Place 2 sprays into the nose 3 (three) times daily.   Marland Kitchen  levocetirizine (XYZAL) 5 MG tablet  Take 1 tablet (5 mg total) by mouth every evening.   Marland Kitchen  levothyroxine (SYNTHROID, LEVOTHROID) 25 MCG tablet  Take 1 tablet (25 mcg total) by mouth daily.   Marland Kitchen  losartan-hydrochlorothiazide (HYZAAR) 50-12.5 MG per tablet  Take 1 tablet by mouth daily.   .  magnesium citrate SOLN  Take 296 mLs (1 Bottle total) by mouth once.   .  metFORMIN (GLUCOPHAGE) 1000 MG tablet  Take 1 tablet (1,000 mg total) by mouth 2 (two) times daily with a meal.   .  metoprolol (LOPRESSOR) 50 MG tablet  Take 1 tablet (50 mg total) by mouth 2 (two) times daily.   .  metroNIDAZOLE (FLAGYL) 500 MG tablet  Take 1 tablet (500 mg total) by mouth 3 (three) times daily.   .  pantoprazole (PROTONIX) 40 MG tablet  Take 1 tablet (40 mg total) by mouth daily.   .  potassium chloride (K-DUR,KLOR-CON) 10 MEQ tablet  Take 1 tablet (10 mEq total) by mouth daily.   .  ranitidine (ZANTAC) 150 MG tablet  Take 1 tablet (150 mg total) by mouth 2 (two) times daily.   .  traMADol (ULTRAM) 50 MG tablet  Take 1 tablet (50 mg total) by mouth every 8 (eight) hours as needed for moderate pain. For pain   Allergy:  Allergies   Allergen  Reactions   .  Glimepiride  Other (See Comments)     Per patient causes severe gas pain   .  Morphine And Related  Nausea And Vomiting   .  Colcrys [Colchicine]  Rash   .  Lisinopril  Rash   Social Hx:  History    Social History   .  Marital Status:  Single     Spouse Name:  N/A     Number of Children:  N/A   .  Years of Education:  N/A    Occupational History   .  Not on file.    Social History Main Topics   .  Smoking status:  Former Research scientist (life sciences)   .  Smokeless tobacco:  Not on file   .  Alcohol Use:  Yes   .  Drug Use:  No   .   Sexual Activity:  No    Other Topics  Concern   .  Not on file    Social History Narrative   .  No narrative on file   Past Surgical Hx:  Past Surgical History   Procedure  Laterality  Date   .  Cholecystectomy     .  Thyroidectomy     Past Medical Hx:  Past Medical History   Diagnosis  Date   .  Hypertension    .  Hypothyroid    .  Diverticulitis    .  Arthritis    .  Allergy    .  Diabetes mellitus without complication    Past Gynecological History:  G1P1 (SVD). Menopause age 34. Endometrial cancer. No LMP recorded. Patient is postmenopausal.  Family Hx:  Family History   Problem  Relation  Age of Onset   .  Stroke  Mother    .  Cancer  Father    .  Cancer  Brother    Review of Systems:  Constitutional  Feels well,  ENT  Normal appearing ears and nares bilaterally  Skin/Breast  No rash, sores, jaundice, itching, dryness  Cardiovascular  No chest pain, shortness of breath, or edema  Pulmonary  No cough or wheeze.  Gastro Intestinal  No nausea, vomitting, or diarrhoea. No bright red blood per rectum, no abdominal pain, change in bowel movement, or constipation.  Genito Urinary  No frequency, urgency, dysuria,  Musculo Skeletal  No myalgia, arthralgia, joint swelling or pain  Neurologic  No weakness, numbness, change in gait,  Psychology  No depression, anxiety, insomnia.  Vitals: Blood pressure 149/95, pulse 85, temperature 98 F (36.7 C), temperature source Oral, resp. rate 22, height 5\' 5"  (1.651 m), weight 255 lb 8 oz (115.894 kg).  Physical Exam:  WD in NAD  Neck  Supple NROM, without any enlargements.  Lymph Node Survey  No cervical supraclavicular or inguinal adenopathy  Cardiovascular  Pulse normal rate, regularity and rhythm. S1 and S2 normal.  Lungs  Clear to auscultation bilateraly, without wheezes/crackles/rhonchi. Good air movement.  Skin  No rash/lesions/breakdown  Psychiatry  Alert and oriented to person, place, and time  Abdomen   Normoactive bowel sounds, abdomen soft, non-tender and obese without evidence of hernia. Well healed upper abdominal cholecystectomy incision (subcostal Rt).  Back  No CVA tenderness  Genito Urinary  Vulva/vagina: Normal external female genitalia. No lesions. No discharge or bleeding.  Bladder/urethra: No lesions or masses, well supported bladder  Vagina: normal appearing  Cervix: Normal appearing, no lesions.  Uterus: Small, mobile, no parametrial involvement or nodularity.  Adnexa: no palpable masses though the exam is limited by body habitus. There was tenderness in the left fornix with deep palpation..  Rectal  Good tone, no masses no cul de sac nodularity.  Extremities  No bilateral cyanosis, clubbing or edema.  Donaciano Eva, MD

## 2013-11-23 NOTE — Transfer of Care (Signed)
Immediate Anesthesia Transfer of Care Note  Patient: Donna [name]  Procedure(s) Performed: Procedure(s): ROBOTIC ASSISTED TOTAL HYSTERECTOMY WITH BILATERAL SALPINGO OOPHORECTOMY, LYMPH NODE DISSECTION ,LYSIS OF ADHESIONS,PELVIC WASHINGS  (Bilateral) SUTURE REMOVAL (N/A)  Patient Location: PACU  Anesthesia Type:General  Level of Consciousness: sedated  Airway & Oxygen Therapy: Patient Spontanous Breathing and Patient connected to face mask oxygen  Post-op Assessment: Report given to PACU RN and Post -op Vital signs reviewed and stable  Post vital signs: Reviewed and stable  Complications: No apparent anesthesia complications

## 2013-11-23 NOTE — Anesthesia Postprocedure Evaluation (Signed)
  Anesthesia Post-op Note  Patient: Donna [name]  Procedure(s) Performed: Procedure(s) (LRB): ROBOTIC ASSISTED TOTAL HYSTERECTOMY WITH BILATERAL SALPINGO OOPHORECTOMY, LYMPH NODE DISSECTION ,LYSIS OF ADHESIONS,PELVIC WASHINGS  (Bilateral) SUTURE REMOVAL (N/A)  Patient Location: PACU  Anesthesia Type: General  Level of Consciousness: awake and alert   Airway and Oxygen Therapy: Patient Spontanous Breathing  Post-op Pain: mild  Post-op Assessment: Post-op Vital signs reviewed, Patient's Cardiovascular Status Stable, Respiratory Function Stable, Patent Airway and No signs of Nausea or vomiting  Last Vitals:  Filed Vitals:   11/23/13 1010  BP: 177/85  Pulse: 76  Temp: 36.3 C  Resp: 16    Post-op Vital Signs: stable   Complications: No apparent anesthesia complications

## 2013-11-23 NOTE — Progress Notes (Signed)
Spoke with Dr Delsa Sale via [place] about patient blood sugars over 300.  Orders received and placed.

## 2013-11-23 NOTE — Op Note (Signed)
PATIENT: Donna [name] DATE OF BIRTH: 06/25/1955 ENCOUNTER DATE:    Preop Diagnosis: grade 1 endometrioid adenocarcinoma.   Postoperative Diagnosis: same.   Surgery: Total robotic hysterectomy bilateral salpingo-oophorectomy, bilateral pelvic lymph node dissection, lysis of adhesions.   Surgeons:  Everitt Amber, MD; Lahoma Crocker, MD (an MD assistant was necessary for tissue manipulation, management of robotic instrumentation, retraction and positioning due to the complexity of the case and hospital policies).   Anesthesia: General   Estimated blood loss:  100   IVF: 2000   Urine output:  782   Complications: None   Pathology: Uterus, cervix, bilateral tubes and ovaries, left and right pelvic lymph nodes to pathology, washings.   Operative findings: large volume adhesions between anterior abdominal wall, uterus and sigmoid colon.  Procedure: The patient was identified in the preoperative holding area. Informed consent was signed on the chart. Patient was seen history was reviewed and exam was performed.   The patient was then taken to the operating room and placed in the supine position with SCD hose on. She was then placed in the dorsolithotomy position. Her arms were tucked at her side with appropriate precautions on the gel pad. General anesthesia was then induced without difficulty. Shoulder blocks were then placed in the usual fashion with appropriate precautions. A OG-tube was placed to suction. First timeout was performed to confirm the patient, procedure, antibiotic, allergy status, estimated blood loss and OR time. The perineum was then prepped in the usual fashion with Betadine. A 14 French Foley was inserted into the bladder under sterile conditions. A sterile speculum was placed in the vagina. The cervix was without lesions. The cervix was grasped with a single-tooth tenaculum. The dilator without difficulty. A ZUMI with a large Koe ring was placed without difficulty. The  abdomen was then prepped with 2 Chlor prep sponges per protocol.   Patient was then draped after the prep was dried. Second timeout was performed to confirm the above. After again confirming OG tube placement and it was to suction. A stab-wound was made in left upper quadrant 2 cm below the costal margin on the left in the midclavicular line. A 5 mm operative report was used to assure intra-abdominal placement. The abdomen was insufflated. At this point all points during the procedure the patient's intra-abdominal pressure was not increased over 15 mm of mercury. After insufflation was complete, the patient was placed in deep Trendelenburg position. 25 cm above the pubic symphysis that area was marked the camera port. Bilateral robotic ports were marked 10 cm from the midline incision at approximately 5 angle. Under direct visualization each of the trochars was placed into the abdomen. The small bowel was folded on its mesentery to allow visualization to the pelvis. The 5 mm LUQ port was then converted to a 10/12 port under direct visualization.  After assuring adequate visualization, the robot was then docked in the usual fashion. Under direct visualization the robotic instruments replaced.   The round ligament on the patient's right side was transected with monopolar cautery. The anterior and posterior leaves of the broad ligament were then taken down in the usual fashion. The ureter was identified on the medial leaf of the broad ligament. A window was made between the IP and the ureter. The IP was coagulated with bipolar cautery and transected. The posterior leaf of the broad ligament was taken down to the level of the KOH ring. The bladder flap was created using meticulous dissection and pinpoint cautery. The uterine  vessels were coagulated with bipolar cautery. The uterine vessels were then transected and the C loop was created. The same procedure was performed on the patient's left side.   The  pneumo-occulder in the vagina was then insufflated. The colpotomy was then created in the usual fashion. The specimen was then delivered to the vagina and sent for frozen section. Our attention was then drawn to opening the paravesical space on her right side the perirectal space was also opened. The obturator nerve was identified. The nodal bundle extending over the external iliac artery down to the external iliac vein was taken down using sharp dissection and monopolar cautery. The genitofemoral nerve was identified and spared. We continued our dissection down to the level of the obturator nerve. The nodal bundle superior to the obturator nerve was taken. All pedicles were noted to be hemostatic the ureter was noted to be well medial of the area of dissection. The nodal bundle was then placed and an Endo catch bag. The left lymph node dissection was performed in a similar manner with the same boundaries and dissection technique. This specimen was also placed in an endocatch bag and delivered vaginally.   The vaginal cuff was closed with a running 0 Vicryl on CT 1 suture. The abdomen and pelvis were copiously irrigated and noted to be hemostatic. The robotic instruments were removed under direct visualization as were the robotic trochars. The pneumoperitoneum was removed. The patient was then taken out of the Trendelenburg position. Using of 0 Vicryl on a UR 6 needle the midline port fascia was closed after being grasped with allis clamps. The subcutaneous tissues of the port in the left upper quadrant was reapproximated. The skin was closed using 4-0 Vicryl. Steri-Strips and benzoin were applied. The pneumo occluded balloon was removed from the vagina. The vagina was swabbed and noted to be hemostatic.   All instrument needle and Ray-Tec counts were correct x2. The patient tolerated the procedure well and was taken to the recovery room in stable condition. This is Everitt Amber dictating an operative note on  patient Donna [name].

## 2013-11-24 ENCOUNTER — Encounter (HOSPITAL_COMMUNITY): Payer: Self-pay | Admitting: Gynecologic Oncology

## 2013-11-24 DIAGNOSIS — C541 Malignant neoplasm of endometrium: Secondary | ICD-10-CM | POA: Diagnosis not present

## 2013-11-24 LAB — CBC
HCT: 40.9 % (ref 36.0–46.0)
Hemoglobin: 13.4 g/dL (ref 12.0–15.0)
MCH: 28.9 pg (ref 26.0–34.0)
MCHC: 32.8 g/dL (ref 30.0–36.0)
MCV: 88.1 fL (ref 78.0–100.0)
PLATELETS: 348 10*3/uL (ref 150–400)
RBC: 4.64 MIL/uL (ref 3.87–5.11)
RDW: 12.6 % (ref 11.5–15.5)
WBC: 13.9 10*3/uL — AB (ref 4.0–10.5)

## 2013-11-24 LAB — BASIC METABOLIC PANEL
Anion gap: 11 (ref 5–15)
BUN: 13 mg/dL (ref 6–23)
CALCIUM: 8.3 mg/dL — AB (ref 8.4–10.5)
CO2: 28 mEq/L (ref 19–32)
Chloride: 100 mEq/L (ref 96–112)
Creatinine, Ser: 0.62 mg/dL (ref 0.50–1.10)
GLUCOSE: 265 mg/dL — AB (ref 70–99)
Potassium: 4.4 mEq/L (ref 3.7–5.3)
Sodium: 139 mEq/L (ref 137–147)

## 2013-11-24 LAB — GLUCOSE, CAPILLARY: GLUCOSE-CAPILLARY: 223 mg/dL — AB (ref 70–99)

## 2013-11-24 MED ORDER — OXYCODONE-ACETAMINOPHEN 5-325 MG PO TABS: 1.0000 | ORAL_TABLET | ORAL | Status: DC | PRN

## 2013-11-24 NOTE — Discharge Summary (Signed)
Physician Discharge Summary  Patient ID: Donna [name] MRN: [id number] DOB/AGE: [birth year] 58 y.o.  Admit date: 11/23/2013 Discharge date: 11/24/2013  Admission Diagnoses: Endometrial cancer  Discharge Diagnoses:  Principal Problem:   Endometrial cancer Active Problems:   Endometrial cancer, grade I   Discharged Condition:  The patient is in good condition and stable for discharge.    Hospital Course: On 11/23/2013, the patient underwent the following: Procedure(s):  ROBOTIC ASSISTED TOTAL HYSTERECTOMY WITH BILATERAL SALPINGO OOPHORECTOMY, LYMPH NODE DISSECTION ,LYSIS OF ADHESIONS,PELVIC WASHINGS SUTURE REMOVAL.  The postoperative course was uneventful.  She was discharged to home on postoperative day 1 tolerating a regular diet, voiding, and passing flatus.  Consults: None  Significant Diagnostic Studies: None  Treatments: surgery: see above  Discharge Exam: Blood pressure 83/61, pulse 73, temperature 97.5 F (36.4 C), temperature source Oral, resp. rate 18, height 5\' 5"  (1.651 m), weight 255 lb (115.667 kg), SpO2 99.00%. General appearance: alert, cooperative and no distress Resp: clear to auscultation bilaterally Cardio: regular rate and rhythm, S1, S2 normal, no murmur, click, rub or gallop GI: soft, non-tender; bowel sounds normal; no masses,  no organomegaly Extremities: extremities normal, atraumatic, no cyanosis or edema Incision/Wound: Lap sites with dermabond without erythema or drainage  Disposition: 01-Home or Self Care  Discharge Instructions   Call MD for:  difficulty breathing, headache or visual disturbances    Complete by:  As directed      Call MD for:  extreme fatigue    Complete by:  As directed      Call MD for:  hives    Complete by:  As directed      Call MD for:  persistant dizziness or light-headedness    Complete by:  As directed      Call MD for:  persistant nausea and vomiting    Complete by:  As directed      Call MD for:  redness,  tenderness, or signs of infection (pain, swelling, redness, odor or green/yellow discharge around incision site)    Complete by:  As directed      Call MD for:  severe uncontrolled pain    Complete by:  As directed      Call MD for:  temperature >100.4    Complete by:  As directed      Diet - low sodium heart healthy    Complete by:  As directed      Driving Restrictions    Complete by:  As directed   No driving for 1 week.  Do not take narcotics and drive.     Increase activity slowly    Complete by:  As directed      Lifting restrictions    Complete by:  As directed   No lifting greater than 10 lbs.     Sexual Activity Restrictions    Complete by:  As directed   No sexual activity, nothing in the vagina, for 8 weeks.            Medication List    STOP taking these medications       magnesium citrate Soln      TAKE these medications       albuterol 108 (90 BASE) MCG/ACT inhaler  Commonly known as:  PROVENTIL HFA;VENTOLIN HFA  Inhale 2 puffs into the lungs every 4 (four) hours as needed for wheezing or shortness of breath.     cyclobenzaprine 10 MG tablet  Commonly known as:  FLEXERIL  Take 1  tablet (10 mg total) by mouth at bedtime.     ergocalciferol 50000 UNITS capsule  Commonly known as:  VITAMIN D2  Take 1 capsule (50,000 Units total) by mouth once a week.     Fluocinolone Acetonide 0.01 % Oil  Place 5 drops in ear(s) 2 (two) times daily as needed ("eczema in ears").     ibuprofen 800 MG tablet  Commonly known as:  ADVIL,MOTRIN  Take 800 mg by mouth every 8 (eight) hours as needed for mild pain or moderate pain.     ipratropium 0.06 % nasal spray  Commonly known as:  ATROVENT  Place 2 sprays into both nostrils 3 (three) times daily as needed for rhinitis.     levocetirizine 5 MG tablet  Commonly known as:  XYZAL  Take 1 tablet (5 mg total) by mouth every evening.     levothyroxine 25 MCG tablet  Commonly known as:  SYNTHROID, LEVOTHROID  Take 25 mcg  by mouth daily before breakfast.     losartan-hydrochlorothiazide 50-12.5 MG per tablet  Commonly known as:  HYZAAR  Take 1 tablet by mouth every morning.     metFORMIN 1000 MG tablet  Commonly known as:  GLUCOPHAGE  Take 1 tablet (1,000 mg total) by mouth 2 (two) times daily with a meal.     metoprolol 50 MG tablet  Commonly known as:  LOPRESSOR  Take 1 tablet (50 mg total) by mouth 2 (two) times daily.     oxyCODONE-acetaminophen 5-325 MG per tablet  Commonly known as:  PERCOCET/ROXICET  Take 1-2 tablets by mouth every 4 (four) hours as needed for severe pain (moderate to severe pain.  Do not take with tramadol).     pantoprazole 40 MG tablet  Commonly known as:  PROTONIX  Take 40 mg by mouth every evening.     potassium chloride 10 MEQ tablet  Commonly known as:  K-DUR,KLOR-CON  Take 10 mEq by mouth every evening.     ranitidine 150 MG tablet  Commonly known as:  ZANTAC  Take 1 tablet (150 mg total) by mouth 2 (two) times daily.     traMADol 50 MG tablet  Commonly known as:  ULTRAM  Take 1 tablet (50 mg total) by mouth every 8 (eight) hours as needed for moderate pain. For pain           Follow-up Information   Follow up with Alvino Chapel, MD On 12/17/2013. (at 1:15pm at the cancer center)    Specialty:  Gynecology   Contact information:   [address] [place] [address] [phone]       Greater than thirty minutes were spend for face to face discharge instructions and discharge orders/summary in EPIC.   Signed: CROSS, MELISSA DEAL 11/24/2013, 9:33 AM

## 2013-11-24 NOTE — Discharge Instructions (Signed)
11/24/2013  Return to work: 4-6 weeks if applicable  Activity: 1. Be up and out of the bed during the day.  Take a nap if needed.  You may walk up steps but be careful and use the hand rail.  Stair climbing will tire you more than you think, you may need to stop part way and rest.   2. No lifting or straining for 6 weeks.  3. No driving for 1 week.  Do not drive if you are taking narcotic pain medicine.  4. Shower daily.  Use soap and water on your incision and pat dry; don't rub.  No tub baths until cleared by your surgeon.   5. No sexual activity and nothing in the vagina for 8 weeks.  Diet: 1. Low sodium Heart Healthy Diet is recommended.  2. It is safe to use a laxative, such as Miralax or Colace, if you have difficulty moving your bowels.   Wound Care: 1. Keep clean and dry.  Shower daily.  Reasons to call the Doctor:  Fever - Oral temperature greater than 100.4 degrees Fahrenheit  Foul-smelling vaginal discharge  Difficulty urinating  Nausea and vomiting  Increased pain at the site of the incision that is unrelieved with pain medicine.  Difficulty breathing with or without chest pain  New calf pain especially if only on one side  Sudden, continuing increased vaginal bleeding with or without clots.   Contacts: For questions or concerns you should contact:  Dr. Everitt Amber at [phone]  Dr. Lahoma Crocker at [phone]  Joylene John, NP at [phone]  After Hours: call [phone] and ask for the GYN Oncologist on call  Acetaminophen; Oxycodone tablets What is this medicine? ACETAMINOPHEN; OXYCODONE (a set a MEE noe fen; ox i KOE done) is a pain reliever. It is used to treat mild to moderate pain. This medicine may be used for other purposes; ask your health care provider or pharmacist if you have questions. COMMON BRAND NAME(S): Endocet, Magnacet, Narvox, Percocet, Perloxx, Primalev, Primlev, Roxicet, Xolox What should I tell my health care  provider before I take this medicine? They need to know if you have any of these conditions: -brain tumor -Crohn's disease, inflammatory bowel disease, or ulcerative colitis -drug abuse or addiction -head injury -heart or circulation problems -if you often drink alcohol -kidney disease or problems going to the bathroom -liver disease -lung disease, asthma, or breathing problems -an unusual or allergic reaction to acetaminophen, oxycodone, other opioid analgesics, other medicines, foods, dyes, or preservatives -pregnant or trying to get pregnant -breast-feeding How should I use this medicine? Take this medicine by mouth with a full glass of water. Follow the directions on the prescription label. Take your medicine at regular intervals. Do not take your medicine more often than directed. Talk to your pediatrician regarding the use of this medicine in children. Special care may be needed. Patients over 76 years old may have a stronger reaction and need a smaller dose. Overdosage: If you think you have taken too much of this medicine contact a poison control center or emergency room at once. NOTE: This medicine is only for you. Do not share this medicine with others. What if I miss a dose? If you miss a dose, take it as soon as you can. If it is almost time for your next dose, take only that dose. Do not take double or extra doses. What may interact with this medicine? -alcohol -antihistamines -barbiturates like amobarbital, butalbital, butabarbital, methohexital, pentobarbital, phenobarbital, thiopental, and  secobarbital -benztropine -drugs for bladder problems like solifenacin, trospium, oxybutynin, tolterodine, hyoscyamine, and methscopolamine -drugs for breathing problems like ipratropium and tiotropium -drugs for certain stomach or intestine problems like propantheline, homatropine methylbromide, glycopyrrolate, atropine, belladonna, and dicyclomine -general anesthetics like etomidate,  ketamine, nitrous oxide, propofol, desflurane, enflurane, halothane, isoflurane, and sevoflurane -medicines for depression, anxiety, or psychotic disturbances -medicines for sleep -muscle relaxants -naltrexone -narcotic medicines (opiates) for pain -phenothiazines like perphenazine, thioridazine, chlorpromazine, mesoridazine, fluphenazine, prochlorperazine, promazine, and trifluoperazine -scopolamine -tramadol -trihexyphenidyl This list may not describe all possible interactions. Give your health care provider a list of all the medicines, herbs, non-prescription drugs, or dietary supplements you use. Also tell them if you smoke, drink alcohol, or use illegal drugs. Some items may interact with your medicine. What should I watch for while using this medicine? Tell your doctor or health care professional if your pain does not go away, if it gets worse, or if you have new or a different type of pain. You may develop tolerance to the medicine. Tolerance means that you will need a higher dose of the medication for pain relief. Tolerance is normal and is expected if you take this medicine for a long time. Do not suddenly stop taking your medicine because you may develop a severe reaction. Your body becomes used to the medicine. This does NOT mean you are addicted. Addiction is a behavior related to getting and using a drug for a non-medical reason. If you have pain, you have a medical reason to take pain medicine. Your doctor will tell you how much medicine to take. If your doctor wants you to stop the medicine, the dose will be slowly lowered over time to avoid any side effects. You may get drowsy or dizzy. Do not drive, use machinery, or do anything that needs mental alertness until you know how this medicine affects you. Do not stand or sit up quickly, especially if you are an older patient. This reduces the risk of dizzy or fainting spells. Alcohol may interfere with the effect of this medicine. Avoid  alcoholic drinks. There are different types of narcotic medicines (opiates) for pain. If you take more than one type at the same time, you may have more side effects. Give your health care provider a list of all medicines you use. Your doctor will tell you how much medicine to take. Do not take more medicine than directed. Call emergency for help if you have problems breathing. The medicine will cause constipation. Try to have a bowel movement at least every 2 to 3 days. If you do not have a bowel movement for 3 days, call your doctor or health care professional. Do not take Tylenol (acetaminophen) or medicines that have acetaminophen with this medicine. Too much acetaminophen can be very dangerous. Many nonprescription medicines contain acetaminophen. Always read the labels carefully to avoid taking more acetaminophen. What side effects may I notice from receiving this medicine? Side effects that you should report to your doctor or health care professional as soon as possible: -allergic reactions like skin rash, itching or hives, swelling of the face, lips, or tongue -breathing difficulties, wheezing -confusion -light headedness or fainting spells -severe stomach pain -unusually weak or tired -yellowing of the skin or the whites of the eyes Side effects that usually do not require medical attention (report to your doctor or health care professional if they continue or are bothersome): -dizziness -drowsiness -nausea -vomiting This list may not describe all possible side effects. Call your doctor for  medical advice about side effects. You may report side effects to FDA at 1-800-FDA-1088. Where should I keep my medicine? Keep out of the reach of children. This medicine can be abused. Keep your medicine in a safe place to protect it from theft. Do not share this medicine with anyone. Selling or giving away this medicine is dangerous and against the law. Store at room temperature between 20 and 25  degrees C (68 and 77 degrees F). Keep container tightly closed. Protect from light. This medicine may cause accidental overdose and death if it is taken by other adults, children, or pets. Flush any unused medicine down the toilet to reduce the chance of harm. Do not use the medicine after the expiration date. NOTE: This sheet is a summary. It may not cover all possible information. If you have questions about this medicine, talk to your doctor, pharmacist, or health care provider.  2015, [employer]. (2012-09-07 13:17:35)

## 2013-11-24 NOTE — Progress Notes (Signed)
Prescriptions and discharge instructions given to patient.  Questions answered.  Son at bedside for discharge

## 2013-11-25 ENCOUNTER — Telehealth: Payer: Self-pay | Admitting: Gynecologic Oncology

## 2013-11-25 NOTE — Telephone Encounter (Signed)
Informed Donna [name] of her pathology of stage IA grade 1 endometrial cancer with inner half invasion, no LVSI, no metastatic disease. Recommendation is for close followup with surveillance examinations every 6 months.  She informed me that she is doing well postop with no complaints.  Donaciano Eva, MD

## 2013-11-29 ENCOUNTER — Encounter (HOSPITAL_COMMUNITY): Payer: Self-pay | Admitting: Gynecologic Oncology

## 2013-12-09 ENCOUNTER — Other Ambulatory Visit: Payer: Self-pay | Admitting: Dermatology

## 2013-12-17 ENCOUNTER — Encounter: Payer: Self-pay | Admitting: Gynecology

## 2013-12-17 ENCOUNTER — Ambulatory Visit: Payer: 59 | Attending: Gynecology | Admitting: Gynecology

## 2013-12-17 VITALS — BP 152/85 | HR 80 | Temp 98.0°F | Resp 18 | Ht 65.0 in | Wt 255.2 lb

## 2013-12-17 DIAGNOSIS — Z90722 Acquired absence of ovaries, bilateral: Secondary | ICD-10-CM | POA: Insufficient documentation

## 2013-12-17 DIAGNOSIS — K5792 Diverticulitis of intestine, part unspecified, without perforation or abscess without bleeding: Secondary | ICD-10-CM | POA: Insufficient documentation

## 2013-12-17 DIAGNOSIS — Z9049 Acquired absence of other specified parts of digestive tract: Secondary | ICD-10-CM | POA: Diagnosis not present

## 2013-12-17 DIAGNOSIS — Z79899 Other long term (current) drug therapy: Secondary | ICD-10-CM | POA: Insufficient documentation

## 2013-12-17 DIAGNOSIS — Z87891 Personal history of nicotine dependence: Secondary | ICD-10-CM | POA: Insufficient documentation

## 2013-12-17 DIAGNOSIS — Z8542 Personal history of malignant neoplasm of other parts of uterus: Secondary | ICD-10-CM

## 2013-12-17 DIAGNOSIS — I10 Essential (primary) hypertension: Secondary | ICD-10-CM | POA: Insufficient documentation

## 2013-12-17 DIAGNOSIS — Z90710 Acquired absence of both cervix and uterus: Secondary | ICD-10-CM | POA: Diagnosis not present

## 2013-12-17 DIAGNOSIS — K219 Gastro-esophageal reflux disease without esophagitis: Secondary | ICD-10-CM | POA: Diagnosis not present

## 2013-12-17 DIAGNOSIS — E119 Type 2 diabetes mellitus without complications: Secondary | ICD-10-CM | POA: Insufficient documentation

## 2013-12-17 DIAGNOSIS — C541 Malignant neoplasm of endometrium: Secondary | ICD-10-CM | POA: Insufficient documentation

## 2013-12-17 DIAGNOSIS — E039 Hypothyroidism, unspecified: Secondary | ICD-10-CM | POA: Insufficient documentation

## 2013-12-17 DIAGNOSIS — Z9079 Acquired absence of other genital organ(s): Secondary | ICD-10-CM | POA: Diagnosis not present

## 2013-12-17 NOTE — Progress Notes (Signed)
Consult Note: Gyn-Onc   Donna [name] 58 y.o. female  Chief Complaint  Patient presents with  . 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 Clinically free of disease. Patient had an excellent postoperative recovery  Plan: Patient may return to full levels of activity. She return to see [place] in 6 months. Given the good prognostic features no additional therapy is recommended.  Interval History: The patient returns today for her first postoperative checkup. She underwent robotic hysterectomy, bilateral salpingo-vitrectomy, and bilateral pelvic lymphadenectomy 11/23/2013. She had an uncomplicated postoperative course. Final pathology showed superficial invasive grade 1 endometrial carcinoma. Adnexa and lymph nodes were negative.  HPI: Patient initially presented with postmenopausal bleeding and endometrial biopsy showing a grade 1 endometrial carcinoma.She underwent robotic hysterectomy, bilateral salpingo-vitrectomy, and bilateral pelvic lymphadenectomy 11/23/2013. She had an uncomplicated postoperative course. Final pathology showed superficial invasive grade 1 endometrial carcinoma. Adnexa and lymph nodes were negative.   Review of Systems:10 point review of systems is negative except as noted in interval history.   Vitals: Blood pressure 152/85, pulse 80, temperature 98 F (36.7 C), temperature source Oral, resp. rate 18, height 5\' 5"  (1.651 m), weight 255 lb 3.2 oz (115.758 kg).  Physical Exam: General : The patient is a healthy woman in no acute distress.  HEENT: normocephalic, extraoccular movements normal; neck is supple without thyromegally  Lynphnodes: Supraclavicular and inguinal nodes not enlarged  Abdomen: Obese, all incisions are healing well. Soft, non-tender, no ascites, no organomegally, no masses, no hernias  Pelvic:  EGBUS: Normal female  Vagina: Normal, no lesions , the cuff is healing well. Urethra and Bladder: Normal,  non-tender  Cervix: Surgically absent  Uterus: Surgically absent  Bi-manual examination: Non-tender; no adenxal masses or nodularity  Rectal: normal sphincter tone, no masses, no blood  Lower extremities: No edema or varicosities. Normal range of motion      Allergies  Allergen Reactions  . Glimepiride Other (See Comments)    Per patient causes severe gas pain  . Morphine And Related Nausea And Vomiting  . Colcrys [Colchicine] Rash  . Lisinopril Rash    Past Medical History  Diagnosis Date  . Hypertension   . Hypothyroid   . Diverticulitis   . Arthritis   . Allergy   . Diabetes mellitus without complication   . History of bronchitis   . History of gout   . Skin cancer (melanoma)     to be removed 12/09/13  . GERD (gastroesophageal reflux disease)   . Difficulty sleeping   . Endometrial cancer     Past Surgical History  Procedure Laterality Date  . Cholecystectomy    . Thyroidectomy    . Robotic assisted total hysterectomy with bilateral salpingo oopherectomy Bilateral 11/23/2013    Procedure: ROBOTIC ASSISTED TOTAL HYSTERECTOMY WITH BILATERAL SALPINGO OOPHORECTOMY, LYMPH NODE DISSECTION ,LYSIS OF ADHESIONS,PELVIC WASHINGS ;  Surgeon: Everitt Amber, MD;  Location: [facility];  Service: Gynecology;  Laterality: Bilateral;  . Suture removal N/A 11/23/2013    Procedure: SUTURE REMOVAL;  Surgeon: Everitt Amber, MD;  Location: [facility];  Service: Gynecology;  Laterality: N/A;    Current Outpatient Prescriptions  Medication Sig Dispense Refill  . albuterol (PROVENTIL HFA;VENTOLIN HFA) 108 (90 BASE) MCG/ACT inhaler Inhale 2 puffs into the lungs every 4 (four) hours as needed for wheezing or shortness of breath. 1 Inhaler 11  . ergocalciferol (VITAMIN D2) 50000 UNITS capsule Take 1 capsule (50,000 Units total) by mouth once a  week. 12 capsule 1  . Fluocinolone Acetonide 0.01 % OIL Place 5 drops in ear(s) 2 (two) times daily as needed ("eczema in ears").    Marland Kitchen ibuprofen (ADVIL,MOTRIN) 800  MG tablet Take 800 mg by mouth every 8 (eight) hours as needed for mild pain or moderate pain.     Marland Kitchen levothyroxine (SYNTHROID, LEVOTHROID) 25 MCG tablet Take 25 mcg by mouth daily before breakfast.    . losartan-hydrochlorothiazide (HYZAAR) 50-12.5 MG per tablet Take 1 tablet by mouth every morning.    . magnesium citrate SOLN   1  . metoprolol (LOPRESSOR) 50 MG tablet Take 1 tablet (50 mg total) by mouth 2 (two) times daily. 180 tablet 1  . mupirocin ointment (BACTROBAN) 2 %   0  . oxyCODONE-acetaminophen (PERCOCET/ROXICET) 5-325 MG per tablet Take 1-2 tablets by mouth every 4 (four) hours as needed for severe pain (moderate to severe pain.  Do not take with tramadol). 30 tablet 0  . pantoprazole (PROTONIX) 40 MG tablet Take 40 mg by mouth every evening.    . potassium chloride (K-DUR,KLOR-CON) 10 MEQ tablet Take 10 mEq by mouth every evening.    . ranitidine (ZANTAC) 150 MG tablet Take 1 tablet (150 mg total) by mouth 2 (two) times daily. 180 tablet 1  . traMADol (ULTRAM) 50 MG tablet Take 1 tablet (50 mg total) by mouth every 8 (eight) hours as needed for moderate pain. For pain 60 tablet 5  . cyclobenzaprine (FLEXERIL) 10 MG tablet Take 1 tablet (10 mg total) by mouth at bedtime. 30 tablet 0  . ipratropium (ATROVENT) 0.06 % nasal spray Place 2 sprays into both nostrils 3 (three) times daily as needed for rhinitis.    Marland Kitchen levocetirizine (XYZAL) 5 MG tablet Take 1 tablet (5 mg total) by mouth every evening. 30 tablet 5  . metFORMIN (GLUCOPHAGE) 1000 MG tablet Take 1 tablet (1,000 mg total) by mouth 2 (two) times daily with a meal. 180 tablet 1   No current facility-administered medications for this visit.    History   Social History  . Marital Status: Single    Spouse Name: N/A    Number of Children: N/A  . Years of Education: N/A   Occupational History  . Not on file.   Social History Main Topics  . Smoking status: Former Smoker    Quit date: 11/20/2007  . Smokeless tobacco: Not on  file  . Alcohol Use: Yes     Comment: occasional  . Drug Use: No  . Sexual Activity: No   Other Topics Concern  . Not on file   Social History Narrative    Family History  Problem Relation Age of Onset  . Stroke Mother   . Cancer Father   . Cancer Brother       Alvino Chapel, MD 12/17/2013, 1:20 PM

## 2013-12-17 NOTE — Patient Instructions (Signed)
Return to see us in 6 months. 

## 2013-12-28 ENCOUNTER — Telehealth: Payer: Self-pay | Admitting: *Deleted

## 2013-12-28 ENCOUNTER — Encounter: Payer: Self-pay | Admitting: *Deleted

## 2013-12-28 NOTE — Telephone Encounter (Signed)
Pt called requesting a letter that it is okay for her to return to work 01/05/14. Letter signed by Joylene John, NP and placed in envelope at front desk at [facility] with pt's name on it. Called pt and let her know that letter is ready to pick up. Pt agreeable to pickup.

## 2014-01-07 ENCOUNTER — Ambulatory Visit (INDEPENDENT_AMBULATORY_CARE_PROVIDER_SITE_OTHER): Payer: 59 | Admitting: Family Medicine

## 2014-01-07 ENCOUNTER — Encounter: Payer: Self-pay | Admitting: Family Medicine

## 2014-01-07 VITALS — BP 136/82 | HR 72 | Temp 98.1°F | Resp 16 | Ht 65.25 in | Wt 250.4 lb

## 2014-01-07 DIAGNOSIS — Z79899 Other long term (current) drug therapy: Secondary | ICD-10-CM

## 2014-01-07 DIAGNOSIS — I10 Essential (primary) hypertension: Secondary | ICD-10-CM

## 2014-01-07 DIAGNOSIS — E890 Postprocedural hypothyroidism: Secondary | ICD-10-CM

## 2014-01-07 DIAGNOSIS — E559 Vitamin D deficiency, unspecified: Secondary | ICD-10-CM

## 2014-01-07 DIAGNOSIS — E119 Type 2 diabetes mellitus without complications: Secondary | ICD-10-CM

## 2014-01-07 DIAGNOSIS — K760 Fatty (change of) liver, not elsewhere classified: Secondary | ICD-10-CM

## 2014-01-07 LAB — COMPLETE METABOLIC PANEL WITH GFR
ALT: 49 U/L — ABNORMAL HIGH (ref 0–35)
AST: 59 U/L — AB (ref 0–37)
Albumin: 4.4 g/dL (ref 3.5–5.2)
Alkaline Phosphatase: 86 U/L (ref 39–117)
BILIRUBIN TOTAL: 0.5 mg/dL (ref 0.2–1.2)
BUN: 13 mg/dL (ref 6–23)
CO2: 28 mEq/L (ref 19–32)
CREATININE: 0.63 mg/dL (ref 0.50–1.10)
Calcium: 9.3 mg/dL (ref 8.4–10.5)
Chloride: 99 mEq/L (ref 96–112)
GFR, Est African American: >89 mL/min
GFR, Est Non African American: >89 mL/min
Glucose, Bld: 176 mg/dL — ABNORMAL HIGH (ref 70–99)
Potassium: 4 mEq/L (ref 3.5–5.3)
Sodium: 138 mEq/L (ref 135–145)
Total Protein: 7.4 g/dL (ref 6.0–8.3)

## 2014-01-07 LAB — GLUCOSE, POCT (MANUAL RESULT ENTRY): POC GLUCOSE: 177 mg/dL — AB (ref 70–99)

## 2014-01-07 LAB — TSH: TSH: 0.604 u[IU]/mL (ref 0.350–4.500)

## 2014-01-07 LAB — POCT GLYCOSYLATED HEMOGLOBIN (HGB A1C): HEMOGLOBIN A1C: 8.3

## 2014-01-07 MED ORDER — LORATADINE 10 MG PO TABS: 10.0000 mg | ORAL_TABLET | Freq: Every day | ORAL | Status: DC

## 2014-01-07 MED ORDER — METOPROLOL TARTRATE 50 MG PO TABS: 50.0000 mg | ORAL_TABLET | Freq: Two times a day (BID) | ORAL | Status: DC

## 2014-01-07 MED ORDER — LOSARTAN POTASSIUM-HCTZ 50-12.5 MG PO TABS: 1.0000 | ORAL_TABLET | ORAL | Status: DC

## 2014-01-07 MED ORDER — METFORMIN HCL ER (OSM) 1000 MG PO TB24: 2000.0000 mg | ORAL_TABLET | Freq: Every day | ORAL | Status: DC

## 2014-01-07 NOTE — Patient Instructions (Addendum)
You should stay away from all cough and cold medications containing decongestant, especially phenylephrine and pseudoephedrine (will be listed under the active ingredient list).  To make it easier, CoricidinHBP is a product tailored towards people with hypertension.  Be careful of an product with the "-D" as this is normally for "decongestant".  Diabetes Mellitus and Food It is important for you to manage your blood sugar (glucose) level. Your blood glucose level can be greatly affected by what you eat. Eating healthier foods in the appropriate amounts throughout the day at about the same time each day will help you control your blood glucose level. It can also help slow or prevent worsening of your diabetes mellitus. Healthy eating may even help you improve the level of your blood pressure and reach or maintain a healthy weight.  HOW CAN FOOD AFFECT ME? Carbohydrates Carbohydrates affect your blood glucose level more than any other type of food. Your dietitian will help you determine how many carbohydrates to eat at each meal and teach you how to count carbohydrates. Counting carbohydrates is important to keep your blood glucose at a healthy level, especially if you are using insulin or taking certain medicines for diabetes mellitus. Alcohol Alcohol can cause sudden decreases in blood glucose (hypoglycemia), especially if you use insulin or take certain medicines for diabetes mellitus. Hypoglycemia can be a life-threatening condition. Symptoms of hypoglycemia (sleepiness, dizziness, and disorientation) are similar to symptoms of having too much alcohol.  If your health care provider has given you approval to drink alcohol, do so in moderation and use the following guidelines:  Women should not have more than one drink per day, and men should not have more than two drinks per day. One drink is equal to:  12 oz of beer.  5 oz of wine.  1 oz of hard liquor.  Do not drink on an empty  stomach.  Keep yourself hydrated. Have water, diet soda, or unsweetened iced tea.  Regular soda, juice, and other mixers might contain a lot of carbohydrates and should be counted. WHAT FOODS ARE NOT RECOMMENDED? As you make food choices, it is important to remember that all foods are not the same. Some foods have fewer nutrients per serving than other foods, even though they might have the same number of calories or carbohydrates. It is difficult to get your body what it needs when you eat foods with fewer nutrients. Examples of foods that you should avoid that are high in calories and carbohydrates but low in nutrients include:  Trans fats (most processed foods list trans fats on the Nutrition Facts label).  Regular soda.  Juice.  Candy.  Sweets, such as cake, pie, doughnuts, and cookies.  Fried foods. WHAT FOODS CAN I EAT? Have nutrient-rich foods, which will nourish your body and keep you healthy. The food you should eat also will depend on several factors, including:  The calories you need.  The medicines you take.  Your weight.  Your blood glucose level.  Your blood pressure level.  Your cholesterol level. You also should eat a variety of foods, including:  Protein, such as meat, poultry, fish, tofu, nuts, and seeds (lean animal proteins are best).  Fruits.  Vegetables.  Dairy products, such as milk, cheese, and yogurt (low fat is best).  Breads, grains, pasta, cereal, rice, and beans.  Fats such as olive oil, trans fat-free margarine, canola oil, avocado, and olives. DOES EVERYONE WITH DIABETES MELLITUS HAVE THE SAME MEAL PLAN? Because every person with  diabetes mellitus is different, there is not one meal plan that works for everyone. It is very important that you meet with a dietitian who will help you create a meal plan that is just right for you. Document Released: 10/11/2004 Document Revised: 01/19/2013 Document Reviewed: 12/11/2012 [facility] Patient  Information 2015 [place], [place]. This information is not intended to replace advice given to you by your health care provider. Make sure you discuss any questions you have with your health care provider.

## 2014-01-07 NOTE — Progress Notes (Addendum)
Subjective:    Patient ID: Donna [name], female    DOB: Jan 10, 1956, 58 y.o.   MRN: [id number] This chart was scribed for [name], MD by Zola Button, Medical Scribe. This patient was seen in Room 24 and the patient's care was started at 1:16 PM.   HPI HPI Comments: Donna [name] is a 58 y.o. female who presents to the [facility] for a follow-up.  She had a complete hysterectomy with BSO for endometrial CA. She did very well and went back to work this week. She will need continued 6 month surveillance with Gyn Onc doctor, next one is in March 2016. Patient was deficient on vitamin D and was started on high dose replacement. Also has hx of thyroid goiter which was removed; however, her thyroid tissue had regrown down into her chest causing a mass. She is not having any dysfunction from it, but she has been able to ween down on her levothyroxine and may be able to stop this. Considering endocrinology evaluation for a second opinion to make sure it is benign. Pt also complaining of severe leg cramping at night which improved with flexeril, magnesium supplements and increased fluids. She has elevated liver enzymes presumable due to fatty liver, but she has not had imaging. She is working [employer]. Hepatitis C antibody was negative.  Cancer: On the 25th of November when she had a 2 cm melanoma from her back removed, she was told the patch on her back would fall off on her own, but it has not fallen off yet. Patient went to Dr. Shirlee Limerick for biopsies, but she has not been set up with endocrinology.  Diabetes: She has been taking her CBG at home. Patient notes having diarrhea while on metformin.   Medications: Patient states that her allergy medication has been making her lethargic in the morning. She has still been taking her vitamin D. Patient has been alternating Claritin and Claritin D. She does not have a blood pressure cuff at home.  Leg Cramps: She has leg cramps every once in a while.  Patient does not take potassium every day; only when her legs cramps. She has been noticing more leg changes with cold weather changes.  Immunizations: Patient received her flu shot at work.  Dermatologist: at [facility] dermatologist  Review of Systems  Musculoskeletal: Positive for myalgias.       Objective:  BP 136/82 mmHg  Pulse 72  Temp(Src) 98.1 F (36.7 C) (Oral)  Resp 16  Ht 5' 5.25" (1.657 m)  Wt 250 lb 6.4 oz (113.581 kg)  BMI 41.37 kg/m2  SpO2 96%  Physical Exam  Constitutional: She is oriented to person, place, and time. She appears well-developed and well-nourished. No distress.  HENT:  Head: Normocephalic and atraumatic.  Mouth/Throat: Oropharynx is clear and moist. No oropharyngeal exudate.  Eyes: Pupils are equal, round, and reactive to light.  Neck: Neck supple.  Cardiovascular: Normal rate, regular rhythm, S1 normal, S2 normal and normal heart sounds.   No murmur heard. Pulmonary/Chest: Effort normal and breath sounds normal. No respiratory distress. She has no wheezes. She has no rales.  Musculoskeletal: She exhibits no edema.  Neurological: She is alert and oriented to person, place, and time. No cranial nerve deficit.  Skin: Skin is warm and dry. No rash noted.  Psychiatric: She has a normal mood and affect. Her behavior is normal.  Nursing note and vitals reviewed.      Assessment & Plan:  Diabetes mellitus without complication - Plan: HM Diabetes Foot Exam, POCT glycosylated hemoglobin (Hb A1C), POCT glucose (manual entry), Ambulatory referral to Endocrinology - severe diarrhea on metformin so change to long-acting.  Will ask for endocrinology c/s to comment on next best medicine since she needs to f/u w/ them re thyroid anyway - I think pt may do best with adding in a new medication to help with weightloss - perhaps invokana though with her recent hysterectomy and gyn surgeries would not want to put pt at risk for increased bladder/pelvic  infections.  Essential hypertension, benign - Plan: COMPLETE METABOLIC PANEL WITH GFR, TSH  Postoperative hypothyroidism - Plan: Ambulatory referral to Endocrinology - pt has operative thyroidectomy prev - no cancer that she knows of but was many yrs ago and notes not available. Thyroid tissue has regrown into a goiter that is progressing substernally and has made her euthyroid so ok to stop levothyroxine 25 - recheck tsh in 6-8 wks but will ask pt to see endocrine - I am concerned that if the tissue continues to grow as it has it will cause dysphagia - wonder if there would be an option to redo surgery or way to prevent tissue growth by safely suppressing tsh?  Vitamin D deficiency - Plan: Vit D  25 hydroxy (rtn osteoporosis monitoring) - s/p high dose replacement x 4 mos - improving but still low at 22 so cont for another 6 mos  Medication management  Fatty liver - last imaged in 2007, hepatitis C neg, transaminases remain mildly elev with normal liver function. Cons repeat US in future if worsenin.  Meds ordered this encounter  Medications  . loratadine (CLARITIN) 10 MG tablet    Sig: Take 1 tablet (10 mg total) by mouth daily.    Dispense:  30 tablet    Refill:  11  . losartan-hydrochlorothiazide (HYZAAR) 50-12.5 MG per tablet    Sig: Take 1 tablet by mouth every morning.    Dispense:  90 tablet    Refill:  3  . DISCONTD: metoprolol (LOPRESSOR) 50 MG tablet    Sig: Take 1 tablet (50 mg total) by mouth 2 (two) times daily.    Dispense:  180 tablet    Refill:  3  . metformin (FORTAMET) 1000 MG (OSM) 24 hr tablet    Sig: Take 2 tablets (2,000 mg total) by mouth at bedtime.    Dispense:  180 tablet    Refill:  1    Ok to substitute any extended release/long-acting metformin that is least expensive for pt. Please let us know to change rx if needed.  . metoprolol (LOPRESSOR) 50 MG tablet    Sig: Take 1 tablet (50 mg total) by mouth 2 (two) times daily.    Dispense:  180 tablet     Refill:  3    I personally performed the services described in this documentation, which was scribed in my presence. The recorded information has been reviewed and considered, and addended by me as needed.  [name], MD MPH  Results for orders placed or performed in visit on 01/07/14  COMPLETE METABOLIC PANEL WITH GFR  Result Value Ref Range   Sodium 138 135 - 145 mEq/L   Potassium 4.0 3.5 - 5.3 mEq/L   Chloride 99 96 - 112 mEq/L   CO2 28 19 - 32 mEq/L   Glucose, Bld 176 (H) 70 - 99 mg/dL   BUN 13 6 - 23 mg/dL   Creat 0.63 0.50 - 1.10  mg/dL   Total Bilirubin 0.5 0.2 - 1.2 mg/dL   Alkaline Phosphatase 86 39 - 117 U/L   AST 59 (H) 0 - 37 U/L   ALT 49 (H) 0 - 35 U/L   Total Protein 7.4 6.0 - 8.3 g/dL   Albumin 4.4 3.5 - 5.2 g/dL   Calcium 9.3 8.4 - 10.5 mg/dL   GFR, Est African American >89 mL/min   GFR, Est Non African American >89 mL/min  Vit D  25 hydroxy (rtn osteoporosis monitoring)  Result Value Ref Range   Vit D, 25-Hydroxy 22 (L) 30 - 100 ng/mL  TSH  Result Value Ref Range   TSH 0.604 0.350 - 4.500 uIU/mL  POCT glycosylated hemoglobin (Hb A1C)  Result Value Ref Range   Hemoglobin A1C 8.3   POCT glucose (manual entry)  Result Value Ref Range   POC Glucose 177 (A) 70 - 99 mg/dl

## 2014-01-08 LAB — VITAMIN D 25 HYDROXY (VIT D DEFICIENCY, FRACTURES): Vit D, 25-Hydroxy: 22 ng/mL — ABNORMAL LOW (ref 30–100)

## 2014-01-26 DIAGNOSIS — K760 Fatty (change of) liver, not elsewhere classified: Secondary | ICD-10-CM | POA: Insufficient documentation

## 2014-01-26 MED ORDER — ERGOCALCIFEROL 1.25 MG (50000 UT) PO CAPS: 50000.0000 [IU] | ORAL_CAPSULE | ORAL | Status: DC

## 2014-02-04 ENCOUNTER — Ambulatory Visit (INDEPENDENT_AMBULATORY_CARE_PROVIDER_SITE_OTHER): Payer: BLUE CROSS/BLUE SHIELD | Admitting: Endocrinology

## 2014-02-04 ENCOUNTER — Encounter: Payer: Self-pay | Admitting: Endocrinology

## 2014-02-04 ENCOUNTER — Encounter: Payer: Self-pay | Admitting: Dietician

## 2014-02-04 ENCOUNTER — Encounter: Payer: BLUE CROSS/BLUE SHIELD | Attending: Endocrinology | Admitting: Dietician

## 2014-02-04 VITALS — Ht 65.0 in | Wt 255.0 lb

## 2014-02-04 VITALS — BP 145/95 | HR 100 | Temp 98.1°F | Resp 14 | Ht 65.25 in | Wt 255.4 lb

## 2014-02-04 DIAGNOSIS — Z713 Dietary counseling and surveillance: Secondary | ICD-10-CM | POA: Diagnosis not present

## 2014-02-04 DIAGNOSIS — E1165 Type 2 diabetes mellitus with hyperglycemia: Secondary | ICD-10-CM

## 2014-02-04 DIAGNOSIS — E042 Nontoxic multinodular goiter: Secondary | ICD-10-CM

## 2014-02-04 DIAGNOSIS — IMO0002 Reserved for concepts with insufficient information to code with codable children: Secondary | ICD-10-CM

## 2014-02-04 DIAGNOSIS — E119 Type 2 diabetes mellitus without complications: Secondary | ICD-10-CM | POA: Diagnosis not present

## 2014-02-04 DIAGNOSIS — E118 Type 2 diabetes mellitus with unspecified complications: Secondary | ICD-10-CM

## 2014-02-04 NOTE — Progress Notes (Signed)
Patient ID: Donna [name], female   DOB: February 29, 1955, 59 y.o.   MRN: [id number]           Reason for Appointment: Consultation for Type 2 Diabetes  Referring physician: Brigitte Pulse  History of Present Illness:          Diagnosis: Type 2 diabetes mellitus, date of diagnosis: 2010      Past history:.  She thinks her diabetes was borderline at onset and may not have been treated right away. Subsequently the patient had been on metformin in various doses She had been tried on as much as 2000 mg of metformin a day but this would tend to cause diarrhea She had been somewhat erratic with her follow-up with various physicians partly because of insurance issues Records from previous physicians are not available  Recent history:  She was taking metformin 500 mg daily for some time because of intolerance to higher doses and this was increased to 1054m in 9/15. Also she is now taking extended release metformin which she is able to tolerate better but can only take 1000 mg a day Appears that her A1c has been consistently around 8% since 2015 She thinks that she is not motivated to do better with her diet or start an exercise regimen Unable to lose weight either. She is now referred here for further management.       Oral hypoglycemic drugs the patient is taking are: Metformin ER 1000 mg daily      Side effects from medications have been: Diarrhea from high-dose regular metformin Compliance with the medical regimen:  Hypoglycemia:    Glucose monitoring:  done one time a day         Glucometer: Accucheck       Blood Glucose readings by time of day and averages from meter download:  Blood sugars before supper range from 150-170, average 162.  Not checked at any other time  Self-care: The diet that the patient has been following is none Meals: 3 meals per day. Breakfast is yogurt or biscuit, lunch sandwich, fried meat          Exercise: none         Dietician visit, most recent: never                  Weight history:  Wt Readings from Last 3 Encounters:  02/04/14 255 lb 6.4 oz (115.849 kg)  01/07/14 250 lb 6.4 oz (113.581 kg)  12/17/13 255 lb 3.2 oz (115.758 kg)    Glycemic control:   Lab Results  Component Value Date   HGBA1C 8.3 01/07/2014   HGBA1C 8.0 09/03/2013   Lab Results  Component Value Date   LDLCALC 100* 09/03/2013   CREATININE 0.63 01/07/2014         Medication List       This list is accurate as of: 02/04/14  2:15 PM.  Always use your most recent med list.               ACCU-CHEK AVIVA PLUS test strip  Generic drug:  glucose blood  1 each by Other route as needed for other. Use as instructed     albuterol 108 (90 BASE) MCG/ACT inhaler  Commonly known as:  PROVENTIL HFA;VENTOLIN HFA  Inhale 2 puffs into the lungs every 4 (four) hours as needed for wheezing or shortness of breath.     cyclobenzaprine 10 MG tablet  Commonly known as:  FLEXERIL  Take 1 tablet (10 mg  total) by mouth at bedtime.     ergocalciferol 50000 UNITS capsule  Commonly known as:  VITAMIN D2  Take 1 capsule (50,000 Units total) by mouth once a week.     Fluocinolone Acetonide 0.01 % Oil  Place 5 drops in ear(s) 2 (two) times daily as needed ("eczema in ears").     ibuprofen 800 MG tablet  Commonly known as:  ADVIL,MOTRIN  Take 800 mg by mouth every 8 (eight) hours as needed for mild pain or moderate pain.     ipratropium 0.06 % nasal spray  Commonly known as:  ATROVENT  Place 2 sprays into both nostrils 3 (three) times daily as needed for rhinitis.     levothyroxine 25 MCG tablet  Commonly known as:  SYNTHROID, LEVOTHROID     loratadine 10 MG tablet  Commonly known as:  CLARITIN  Take 1 tablet (10 mg total) by mouth daily.     losartan-hydrochlorothiazide 50-12.5 MG per tablet  Commonly known as:  HYZAAR  Take 1 tablet by mouth every morning.     magnesium citrate Soln     metformin 1000 MG (OSM) 24 hr tablet  Commonly known as:  FORTAMET  Take 2 tablets  (2,000 mg total) by mouth at bedtime.     metoprolol 50 MG tablet  Commonly known as:  LOPRESSOR  Take 1 tablet (50 mg total) by mouth 2 (two) times daily.     mupirocin ointment 2 %  Commonly known as:  BACTROBAN     pantoprazole 40 MG tablet  Commonly known as:  PROTONIX  Take 40 mg by mouth every evening.     potassium chloride 10 MEQ tablet  Commonly known as:  K-DUR,KLOR-CON  Take 10 mEq by mouth every evening.     ranitidine 150 MG tablet  Commonly known as:  ZANTAC  Take 1 tablet (150 mg total) by mouth 2 (two) times daily.     traMADol 50 MG tablet  Commonly known as:  ULTRAM  Take 1 tablet (50 mg total) by mouth every 8 (eight) hours as needed for moderate pain. For pain        Allergies:  Allergies  Allergen Reactions  . Glimepiride Other (See Comments)    Per patient causes severe gas pain  . Morphine And Related Nausea And Vomiting  . Colcrys [Colchicine] Rash  . Lisinopril Rash    Past Medical History  Diagnosis Date  . Hypertension   . Hypothyroid   . Diverticulitis   . Arthritis   . Allergy   . Diabetes mellitus without complication   . History of bronchitis   . History of gout   . Skin cancer (melanoma)     to be removed 12/09/13  . GERD (gastroesophageal reflux disease)   . Difficulty sleeping   . Endometrial cancer     Past Surgical History  Procedure Laterality Date  . Cholecystectomy    . Thyroidectomy    . Robotic assisted total hysterectomy with bilateral salpingo oopherectomy Bilateral 11/23/2013    Procedure: ROBOTIC ASSISTED TOTAL HYSTERECTOMY WITH BILATERAL SALPINGO OOPHORECTOMY, LYMPH NODE DISSECTION ,LYSIS OF ADHESIONS,PELVIC WASHINGS ;  Surgeon: Everitt Amber, MD;  Location: [facility];  Service: Gynecology;  Laterality: Bilateral;  . Suture removal N/A 11/23/2013    Procedure: SUTURE REMOVAL;  Surgeon: Everitt Amber, MD;  Location: [facility];  Service: Gynecology;  Laterality: N/A;    Family History  Problem Relation Age of Onset  .  Stroke Mother   . Cancer  Father   . Cancer Brother     Social History:  reports that she quit smoking about 6 years ago. She does not have any smokeless tobacco history on file. She reports that she drinks alcohol. She reports that she does not use illicit drugs.    Review of Systems       Vision is normal. Most recent eye exam was in 2015       Lipids: She has not been on any statin drugs       Lab Results  Component Value Date   CHOL 178 09/03/2013   HDL 43 09/03/2013   LDLCALC 100* 09/03/2013   TRIG 176* 09/03/2013   CHOLHDL 4.1 09/03/2013                  Skin: No rash or infections     Thyroid:    The patient has a very long history of multinodular goiter, probably since about 2002 She said she had multiple nodules and had periodic ultrasounds and biopsies done for few years Also she had been started on 25 g of levothyroxine and is dose has not been changed since 2002 or so However her goiter continue to enlarge progressively and in 2010 she had a thyroidectomy done She has not had any evidence of thyroid cancer in her biopsy or surgical pathology  In 8/12 she was referred for an ultrasound because of a mass seen on her CT scan of the chest. Her ultrasound  showed a 5.7 cm mass in the thyroid which is benign on biopsy She is now referred here for further management  No  unusual fatigue. On 25ug since 20032  Lab Results  Component Value Date   FREET4 1.15 10/22/2013   FREET4 1.02 12/28/2009   TSH 0.604 01/07/2014   TSH 1.126 10/22/2013   TSH 0.787 09/03/2013        The blood pressure has been elevated since her 30s and is taking Hyzaar and metoprolol for this now     No swelling of feet.     No shortness of breath on exertion.  She has no chest pain or tightness     Bowel habits: Normal.     No difficulty swallowing.      No joint  pains. Old left ankle fracture present causing a little swelling but not much discomfort           No history of  Numbness, tingling or burning in feet. Shooting pains, transient, may occur at times.     No history of focal weakness or numbness of her extremities     She has no history of recurrent or recent vaginal candidiasis  No history of urinary discomfort or excessive nocturia   She does have history of mild vitamin D deficiency treated by PCP  She will occasionally have nocturnal muscle cramps and she will take potassium when this happens       LABS:  No visits with results within 1 Week(s) from this visit. Latest known visit with results is:  Office Visit on 01/07/2014  Component Date Value Ref Range Status  . Hemoglobin A1C 01/07/2014 8.3   Final  . POC Glucose 01/07/2014 177* 70 - 99 mg/dl Final  . Sodium 01/07/2014 138  135 - 145 mEq/L Final  . Potassium 01/07/2014 4.0  3.5 - 5.3 mEq/L Final  . Chloride 01/07/2014 99  96 - 112 mEq/L Final  . CO2 01/07/2014 28  19 - 32 mEq/L Final  .  Glucose, Bld 01/07/2014 176* 70 - 99 mg/dL Final  . BUN 01/07/2014 13  6 - 23 mg/dL Final  . Creat 01/07/2014 0.63  0.50 - 1.10 mg/dL Final  . Total Bilirubin 01/07/2014 0.5  0.2 - 1.2 mg/dL Final  . Alkaline Phosphatase 01/07/2014 86  39 - 117 U/L Final  . AST 01/07/2014 59* 0 - 37 U/L Final  . ALT 01/07/2014 49* 0 - 35 U/L Final  . Total Protein 01/07/2014 7.4  6.0 - 8.3 g/dL Final  . Albumin 01/07/2014 4.4  3.5 - 5.2 g/dL Final  . Calcium 01/07/2014 9.3  8.4 - 10.5 mg/dL Final  . GFR, Est African American 01/07/2014 >89   Final  . GFR, Est Non African American 01/07/2014 >89   Final   Comment:   The estimated GFR is a calculation valid for adults (>=67 years old) that uses the CKD-EPI algorithm to adjust for age and sex. It is   not to be used for children, pregnant women, hospitalized patients,    patients on dialysis, or with rapidly changing kidney function. According to the NKDEP, eGFR >89 is normal, 60-89 shows mild impairment, 30-59 shows moderate impairment, 15-29 shows  severe impairment and <15 is ESRD.     Marland Kitchen Vit D, 25-Hydroxy 01/07/2014 22* 30 - 100 ng/mL Final   Comment: ** Please note change in reference range(s). ** Vitamin D Status           25-OH Vitamin D        Deficiency                <20 ng/mL        Insufficiency         20 - 29 ng/mL        Optimal             > or = 30 ng/mL   For 25-OH Vitamin D testing on patients on D2-supplementation and patients for whom quantitation of D2 and D3 fractions is required, the QuestAssureD 25-OH VIT D, (D2,D3), LC/MS/MS is recommended: order code [id number] (patients > 2 yrs).   . TSH 01/07/2014 0.604  0.350 - 4.500 uIU/mL Final    Physical Examination:  BP 145/95 mmHg  Pulse 100  Temp(Src) 98.1 F (36.7 C)  Resp 14  Ht 5' 5.25" (1.657 m)  Wt 255 lb 6.4 oz (115.849 kg)  BMI 42.19 kg/m2  SpO2 97%  GENERAL:         Patient has generalized obesity.   HEENT:         Eye exam shows normal external appearance. Fundus exam shows no retinopathy. Oral exam shows normal mucosa .  NECK:         General:  Neck exam shows no lymphadenopathy.  Carotids are normal to palpation and no bruit heard.  Thyroid is not clearly enlarged.  Has smooth and firm nodule just felt on swallowing in the right medial lobe low in the neck.  Left side not palpable.  LUNGS:         Chest is symmetrical. Lungs are clear to auscultation.Marland Kitchen   HEART:         Heart sounds:  S1 and S2 are normal. No murmurs or clicks heard., no S3 or S4.   ABDOMEN:   There is no distention present. Liver and spleen are not palpable. No other mass or tenderness present.  EXTREMITIES:     There is no edema, minimal on the left lower leg.  Has mild enlargement of the leg ankle joint area. No skin lesions present.Marland Kitchen  NEUROLOGICAL:   Vibration sense is mildly reduced in toes. Ankle jerks are absent bilaterally.            Diabetic foot exam shows normal monofilament sensation in the toes and plantar surfaces, no skin lesions or ulcers on the feet and normal  pedal pulses MUSCULOSKELETAL:       There is no enlargement or deformity of the joints. Spine is normal to inspection.Marland Kitchen   SKIN:       No rash or lesions of concern.        ASSESSMENT:  Diabetes type 2, uncontrolled with morbid obesity She is currently being treated with low-dose metformin only Overall has had minimal diabetes education and does not make efforts to change lifestyle habits of prudent diet and regular exercise for weight loss Discussed the natural history of diabetes and progression especially if obesity is uncontrolled Also currently blood sugars are being checked only before supper time and she is not getting a complete picture With A1c 8.3% she will need at least dual therapy to get better control especially with her not being able to tolerate larger doses of metformin  She is a good candidate for adding a drug like Invokana for multiple benefits; she is more open to doing this compared to an injectable GLP drug Discussed action of SGLT 2 drugs on lowering glucose by decreasing kidney absorption of glucose, benefits of weight loss and lower blood pressure, possible side effects including candidiasis and dosage regimen   Complications: None evident but has not had any urine microalbumin checked.  Also has mild neuropathic symptoms  MULTINODULAR goiter: She has history of a very large multinodular goiter and still has significant amount of residual substernal tissue which is benign on biopsy.  She is however asymptomatic from this and it is difficult to palpate clinically because of substernal location She is only taking 25 g levothyroxine but not for any documented hypothyroidism  Recently has had low normal TSH levels despite taking 25 g of levothyroxine indicating that she does not have hypothyroidism  HYPERTENSION: Although blood pressure is high today she has had relatively better readings with her PCP  PLAN:   She will be seen by the dietitian today for meal planning  and general information on diabetes and weight loss  Start regular walking program  Start Invokana 100 mg daily in the morning.  Avoid any supplemental potassium with this and will need to monitor potassium when starting this  Continue metformin ER 1000 mg a day  Start checking blood sugars at various times of the day rotating between fasting and 2 hours after meals.  She will bring her monitor for download  Observation only for her residual nodular goiter since it is asymptomatic and benign on biopsy  Check TSH again in about one month when she follows up for her diabetes  May try magnesium supplements if having leg cramps   Total visit time including review of multiple medical issues, review of records, counseling = 60 minutes    Ishia Tenorio 02/04/2014, 2:15 PM   Note: This office note was prepared with Dragon voice recognition system technology. Any transcriptional errors that result from this process are unintentional.

## 2014-02-04 NOTE — Patient Instructions (Addendum)
Please check blood sugars at least half the time about 2 hours after any meal and 2-3 times per week on waking up. Please bring blood sugar monitor to each visit  Walk daily!  Invokana 1 pill in am daily,  May take Magnesium and not potassium for cramps

## 2014-02-04 NOTE — Progress Notes (Signed)
  Medical Nutrition Therapy:  Appt start time: 1500 end time:  0321.   Assessment:  Primary concerns today: weight/dm diet.  Lives alone, shops and cooks for herself.  Donna [name] a lot.  Ubw 255 lbs recently.  230 lbs 2 years ago and gained weight after stopped walking after dog died.  Daughter in law is PA and has been coaching patient to make changes.  Patient with high fat high CHO diet.  Hx of DM for the past 5 years, multinodular goiter.  Preferred Learning Style:   Visual  Hands on Does not learn well by reading.  Learning Readiness:   Ready  MEDICATIONS: see list   DIETARY INTAKE:  Usual eating pattern includes 3 meals and 2 snacks per day.  Everyday foods include fried foods.  Avoided foods include sugar.    24-hr recall:  B (8 AM): lite yogurt or sausage/cheese biscuit from [employer] Snk ( AM): chocolate L ( 12:30PM): Leftovers or sandwich (fried bologna) Snk ( PM): bread or chips while cooking dinner D (6PM): fried breaded hamburger steak, lots of starches, gravy, or fast food burger Snk ( PM): none Beverages:  Coke zero, coffee, water  Usual physical activity: walked twice daily until dog died.  None currently.  Estimated energy needs: 1400 calories 158 g carbohydrates 88 g protein 47 g fat  Progress Towards Goal(s):  In progress.   Nutritional Diagnosis:  NB-1.1 Food and nutrition-related knowledge deficit As related to balance of carbohydrates.  As evidenced by diet hx.    Intervention:  Nutrition counseling and diabetes education initiated. Discussed Carb Counting by food group as method of portion control, reading food labels, and benefits of increased activity.  Plan:  Aim for 2-3 Carb Choices per meal (30-45 grams) +/- 1 either way  Aim for 0-1 Carbs per snack if hungry  Consider reducing fried foods. Include protein in moderation with your meals and snacks Consider reading food labels for Total Carbohydrate and Fat Grams of foods Consider   increasing your activity level by walking for 15 minutes daily as tolerated   Teaching Method Utilized:  Visual Auditory Hands on  Handouts given during visit include:  Yellow Carb Card  Low Carb/protein list  My Plate Placemat   Barriers to learning/adherence to lifestyle change: motivation, safety to walk in neighborhood  Demonstrated degree of understanding via:  Teach Back   Monitoring/Evaluation:  Dietary intake, exercise, label reading, and body weight in 1 month(s).

## 2014-02-04 NOTE — Patient Instructions (Signed)
Plan:  Aim for 2-3 Carb Choices per meal (30-45 grams) +/- 1 either way  Aim for 0-1 Carbs per snack if hungry  Consider reducing fried foods. Include protein in moderation with your meals and snacks Consider reading food labels for Total Carbohydrate and Fat Grams of foods Consider  increasing your activity level by walking for 15 minutes daily as tolerated

## 2014-02-07 ENCOUNTER — Other Ambulatory Visit: Payer: Self-pay | Admitting: *Deleted

## 2014-02-07 MED ORDER — CANAGLIFLOZIN 100 MG PO TABS: 100.0000 mg | ORAL_TABLET | Freq: Every day | ORAL | Status: DC

## 2014-03-01 ENCOUNTER — Other Ambulatory Visit (INDEPENDENT_AMBULATORY_CARE_PROVIDER_SITE_OTHER): Payer: BLUE CROSS/BLUE SHIELD

## 2014-03-01 DIAGNOSIS — IMO0002 Reserved for concepts with insufficient information to code with codable children: Secondary | ICD-10-CM

## 2014-03-01 DIAGNOSIS — E042 Nontoxic multinodular goiter: Secondary | ICD-10-CM

## 2014-03-01 DIAGNOSIS — E1165 Type 2 diabetes mellitus with hyperglycemia: Secondary | ICD-10-CM

## 2014-03-01 LAB — COMPREHENSIVE METABOLIC PANEL
ALK PHOS: 81 U/L (ref 39–117)
ALT: 44 U/L — ABNORMAL HIGH (ref 0–35)
AST: 49 U/L — AB (ref 0–37)
Albumin: 4.1 g/dL (ref 3.5–5.2)
BUN: 12 mg/dL (ref 6–23)
CALCIUM: 9.1 mg/dL (ref 8.4–10.5)
CHLORIDE: 102 meq/L (ref 96–112)
CO2: 29 mEq/L (ref 19–32)
CREATININE: 0.61 mg/dL (ref 0.40–1.20)
GFR: 107.28 mL/min (ref >60.00)
GLUCOSE: 139 mg/dL — AB (ref 70–99)
POTASSIUM: 3.4 meq/L — AB (ref 3.5–5.1)
SODIUM: 138 meq/L (ref 135–145)
TOTAL PROTEIN: 7.1 g/dL (ref 6.0–8.3)
Total Bilirubin: 0.4 mg/dL (ref 0.2–1.2)

## 2014-03-01 LAB — TSH: TSH: 2.4 u[IU]/mL (ref 0.35–4.50)

## 2014-03-01 LAB — T4, FREE: Free T4: 0.99 ng/dL (ref 0.60–1.60)

## 2014-03-04 ENCOUNTER — Encounter: Payer: Self-pay | Admitting: Endocrinology

## 2014-03-04 ENCOUNTER — Encounter: Payer: BLUE CROSS/BLUE SHIELD | Attending: Endocrinology | Admitting: Dietician

## 2014-03-04 ENCOUNTER — Ambulatory Visit (INDEPENDENT_AMBULATORY_CARE_PROVIDER_SITE_OTHER): Payer: BLUE CROSS/BLUE SHIELD | Admitting: Endocrinology

## 2014-03-04 VITALS — BP 140/82 | HR 87 | Temp 98.2°F | Resp 14 | Ht 65.25 in | Wt 248.8 lb

## 2014-03-04 DIAGNOSIS — E042 Nontoxic multinodular goiter: Secondary | ICD-10-CM

## 2014-03-04 DIAGNOSIS — IMO0002 Reserved for concepts with insufficient information to code with codable children: Secondary | ICD-10-CM

## 2014-03-04 DIAGNOSIS — Z713 Dietary counseling and surveillance: Secondary | ICD-10-CM | POA: Insufficient documentation

## 2014-03-04 DIAGNOSIS — E1165 Type 2 diabetes mellitus with hyperglycemia: Secondary | ICD-10-CM

## 2014-03-04 DIAGNOSIS — E876 Hypokalemia: Secondary | ICD-10-CM

## 2014-03-04 DIAGNOSIS — E119 Type 2 diabetes mellitus without complications: Secondary | ICD-10-CM | POA: Insufficient documentation

## 2014-03-04 LAB — FRUCTOSAMINE: FRUCTOSAMINE: 257 umol/L (ref 190–270)

## 2014-03-04 NOTE — Progress Notes (Signed)
  Medical Nutrition Therapy:  Appt start time: 1500 end time:  9833.   Assessment:  Primary concerns today: weight/dm diet.  Lives alone, shops and cooks for herself.  Donna [name] a lot.  Ubw 255 lbs recently.  230 lbs 2 years ago and gained weight after stopped walking after dog died.  Daughter in law is PA and has been coaching patient to make changes.  Patient with high fat high CHO diet.  Hx of DM for the past 5 years, multinodular goiter.  03/04/14: Patient has made changes with eating habits.  Decreased fried foods, eating more lean meat and is more mindful of food choices and portion sizes.  She has lost 3 lbs and has begun walking 15 minutes with friend at work during lunch.  Preferred Learning Style:   Visual  Hands on Does not learn well by reading.  Learning Readiness:   Ready  MEDICATIONS: see list   DIETARY INTAKE:  Usual eating pattern includes 3 meals and 2 snacks per day.  Everyday foods include fried foods.  Avoided foods include sugar.    24-hr recall:  B (8 AM): lite yogurt and fruit and granola or lite yogurt  and cheese toast or sausage/cheese biscuit from [place] (reduced from 4 to 2 times per week) Snk ( AM): chocolate L ( 12:30PM): Has started to bring a wrap instead of bologna Snk ( PM): dry toast while cooking dinner D (6PM): cooking at home more, lean meat more often , limiting gravy, veges, and starch Snk ( PM): none Beverages:  Coke zero, coffee, water  Usual physical activity: has started walking 15 minutes at lunch with friend at work  Estimated energy needs: 1400 calories 158 g carbohydrates 88 g protein 47 g fat  Progress Towards Goal(s):  In progress.   Nutritional Diagnosis:  NB-1.1 Food and nutrition-related knowledge deficit As related to balance of carbohydrates.  As evidenced by diet hx.    Intervention:  Nutrition counseling   Great job in the changes that you have made!! Consider asking daughter in law for subscription to  Diabetic Living magazine Keep walking! Consider increasing to 15 minutes every day. Consider decreasing cheese to 1 meal or snack per day.  Teaching Method Utilized:  Auditory  Barriers to learning/adherence to lifestyle change: motivation, safety to walk in neighborhood  Demonstrated degree of understanding via:  Teach Back   Monitoring/Evaluation:  Dietary intake, exercise, label reading, and body weight in 1 month(s).

## 2014-03-04 NOTE — Progress Notes (Signed)
Patient ID: Donna [name], female   DOB: 08/07/1955, 59 y.o.   MRN: [id number]           Reason for Appointment:  Follow-up for Type 2 Diabetes  Referring physician: Brigitte Pulse  History of Present Illness:          Diagnosis: Type 2 diabetes mellitus, date of diagnosis: 2010      Past history:.  She thinks her diabetes was borderline at onset and may not have been treated right away. Subsequently the patient had been on metformin in various doses She had been tried on as much as 2000 mg of metformin a day but this would tend to cause diarrhea She had been somewhat erratic with her follow-up with various physicians partly because of insurance issues Appears that her A1c has been consistently around 8% since 2015  Recent history:  She was taking metformin 1000 mg ER daily without adequate control and A1c of 8.3 She was started on Invokana 100 mg daily in the mornings on 02/07/14 in addition to her metformin.   Prior to her starting metformin she was not checking blood sugars much in the morning and her reading was 228 in am With this she has started to lose weight which she was unable to do before However not clear if her sugars are significantly better at home since her fasting blood sugars are significantly high; checking these somewhat infrequently However fructosamine is in the normal range Also she is now taking extended release metformin which she is able to tolerate better but can only take 1000 mg a day       Oral hypoglycemic drugs the patient is taking are: Metformin ER 1000 mg daily      Side effects from medications have been: Diarrhea from high-dose regular metformin Hypoglycemia: None    Glucose monitoring:  done one time a day         Glucometer: Accucheck       Blood Glucose readings by time of day and averages from meter download:  PRE-MEAL Breakfast- Lunch Dinner  6-8 PM  Overall  Glucose range: 181-219   157   147-241   140-165    Mean/median:  200    172    174     Self-care: The diet that the patient has been following is less fat recently Meals: 3 meals per day. Breakfast is yogurt or biscuit, lunch is a sandwich, fried meat  sometimes         Exercise:  minimal  Dietician visit, most recent: 02/04/14                Weight history:  Wt Readings from Last 3 Encounters:  03/04/14 248 lb 12.8 oz (112.855 kg)  02/04/14 255 lb (115.667 kg)  02/04/14 255 lb 6.4 oz (115.849 kg)    Glycemic control:   Lab Results  Component Value Date   HGBA1C 8.3 01/07/2014   HGBA1C 8.0 09/03/2013   Lab Results  Component Value Date   LDLCALC 100* 09/03/2013   CREATININE 0.61 03/01/2014         Medication List       This list is accurate as of: 03/04/14  1:56 PM.  Always use your most recent med list.               ACCU-CHEK AVIVA PLUS test strip  Generic drug:  glucose blood  1 each by Other route as needed for other. Use as instructed  albuterol 108 (90 BASE) MCG/ACT inhaler  Commonly known as:  PROVENTIL HFA;VENTOLIN HFA  Inhale 2 puffs into the lungs every 4 (four) hours as needed for wheezing or shortness of breath.     canagliflozin 100 MG Tabs tablet  Commonly known as:  INVOKANA  Take 1 tablet (100 mg total) by mouth daily.     cyclobenzaprine 10 MG tablet  Commonly known as:  FLEXERIL  Take 1 tablet (10 mg total) by mouth at bedtime.     ergocalciferol 50000 UNITS capsule  Commonly known as:  VITAMIN D2  Take 1 capsule (50,000 Units total) by mouth once a week.     Fluocinolone Acetonide 0.01 % Oil  Place 5 drops in ear(s) 2 (two) times daily as needed ("eczema in ears").     ibuprofen 800 MG tablet  Commonly known as:  ADVIL,MOTRIN  Take 800 mg by mouth every 8 (eight) hours as needed for mild pain or moderate pain.     ipratropium 0.06 % nasal spray  Commonly known as:  ATROVENT  Place 2 sprays into both nostrils 3 (three) times daily as needed for rhinitis.     levothyroxine 25 MCG tablet  Commonly known as:   SYNTHROID, LEVOTHROID     loratadine 10 MG tablet  Commonly known as:  CLARITIN  Take 1 tablet (10 mg total) by mouth daily.     losartan-hydrochlorothiazide 50-12.5 MG per tablet  Commonly known as:  HYZAAR  Take 1 tablet by mouth every morning.     magnesium citrate Soln     metformin 1000 MG (OSM) 24 hr tablet  Commonly known as:  FORTAMET  Take 2 tablets (2,000 mg total) by mouth at bedtime.     metoprolol 50 MG tablet  Commonly known as:  LOPRESSOR  Take 1 tablet (50 mg total) by mouth 2 (two) times daily.     mupirocin ointment 2 %  Commonly known as:  BACTROBAN     pantoprazole 40 MG tablet  Commonly known as:  PROTONIX  Take 40 mg by mouth every evening.     potassium chloride 10 MEQ tablet  Commonly known as:  K-DUR,KLOR-CON  Take 10 mEq by mouth every evening.     ranitidine 150 MG tablet  Commonly known as:  ZANTAC  Take 1 tablet (150 mg total) by mouth 2 (two) times daily.     traMADol 50 MG tablet  Commonly known as:  ULTRAM  Take 1 tablet (50 mg total) by mouth every 8 (eight) hours as needed for moderate pain. For pain        Allergies:  Allergies  Allergen Reactions  . Glimepiride Other (See Comments)    Per patient causes severe gas pain  . Morphine And Related Nausea And Vomiting  . Colcrys [Colchicine] Rash  . Lisinopril Rash    Past Medical History  Diagnosis Date  . Hypertension   . Hypothyroid   . Diverticulitis   . Arthritis   . Allergy   . Diabetes mellitus without complication   . History of bronchitis   . History of gout   . Skin cancer (melanoma)     to be removed 12/09/13  . GERD (gastroesophageal reflux disease)   . Difficulty sleeping   . Endometrial cancer     Past Surgical History  Procedure Laterality Date  . Cholecystectomy    . Thyroidectomy    . Robotic assisted total hysterectomy with bilateral salpingo oopherectomy Bilateral 11/23/2013    Procedure:  ROBOTIC ASSISTED TOTAL HYSTERECTOMY WITH BILATERAL  SALPINGO OOPHORECTOMY, LYMPH NODE DISSECTION ,LYSIS OF ADHESIONS,PELVIC WASHINGS ;  Surgeon: Everitt Amber, MD;  Location: [facility];  Service: Gynecology;  Laterality: Bilateral;  . Suture removal N/A 11/23/2013    Procedure: SUTURE REMOVAL;  Surgeon: Everitt Amber, MD;  Location: [facility];  Service: Gynecology;  Laterality: N/A;    Family History  Problem Relation Age of Onset  . Stroke Mother   . Heart disease Mother   . Cancer Father   . Diabetes Father   . Cancer Brother   . Diabetes Paternal Uncle     Social History:  reports that she quit smoking about 6 years ago. She does not have any smokeless tobacco history on file. She reports that she drinks alcohol. She reports that she does not use illicit drugs.    Review of Systems       Vision is normal. Most recent eye exam was in 2015       Lipids: She has not been on any statin drugs       Lab Results  Component Value Date   CHOL 178 09/03/2013   HDL 43 09/03/2013   LDLCALC 100* 09/03/2013   TRIG 176* 09/03/2013   CHOLHDL 4.1 09/03/2013                  Thyroid:    Previous history: The patient has a very long history of multinodular goiter, probably since about 2002 She said she had multiple nodules and had periodic ultrasounds and biopsies done for few years Also she had been started on 25 g of levothyroxine and is dose has not been changed since 2002 or so However her goiter continue to enlarge progressively and in 2010 she had a thyroidectomy done She has not had any evidence of thyroid cancer in her biopsy or surgical pathology  In 8/12 she was referred for an ultrasound because of a mass seen on her CT scan of the chest. Her ultrasound  showed a 5.7 cm mass in the thyroid which is benign on biopsy  Her exam in 1/16 showed a smooth and firm nodule just felt on swallowing in the right medial lobe low in the neck.  Since her TSH was low normal at 0.6 she was told to stop her 25 g levothyroxine in 1/16.  She thinks she  feels a little better with this and her TSH is still normal.  Lab Results  Component Value Date   FREET4 0.99 03/01/2014   FREET4 1.15 10/22/2013   FREET4 1.02 12/28/2009   TSH 2.40 03/01/2014   TSH 0.604 01/07/2014   TSH 1.126 10/22/2013       The blood pressure has been elevated since her 30s and is taking Hyzaar and metoprolol for this now     Neuropathy: Shooting pains, transient, may occur at times.        LABS:  Appointment on 03/01/2014  Component Date Value Ref Range Status  . TSH 03/01/2014 2.40  0.35 - 4.50 uIU/mL Final  . Free T4 03/01/2014 0.99  0.60 - 1.60 ng/dL Final  . Sodium 03/01/2014 138  135 - 145 mEq/L Final  . Potassium 03/01/2014 3.4* 3.5 - 5.1 mEq/L Final  . Chloride 03/01/2014 102  96 - 112 mEq/L Final  . CO2 03/01/2014 29  19 - 32 mEq/L Final  . Glucose, Bld 03/01/2014 139* 70 - 99 mg/dL Final  . BUN 03/01/2014 12  6 - 23 mg/dL Final  .  Creatinine, Ser 03/01/2014 0.61  0.40 - 1.20 mg/dL Final  . Total Bilirubin 03/01/2014 0.4  0.2 - 1.2 mg/dL Final  . Alkaline Phosphatase 03/01/2014 81  39 - 117 U/L Final  . AST 03/01/2014 49* 0 - 37 U/L Final  . ALT 03/01/2014 44* 0 - 35 U/L Final  . Total Protein 03/01/2014 7.1  6.0 - 8.3 g/dL Final  . Albumin 03/01/2014 4.1  3.5 - 5.2 g/dL Final  . Calcium 03/01/2014 9.1  8.4 - 10.5 mg/dL Final  . GFR 03/01/2014 107.28  >60.00 mL/min Final  . Fructosamine 03/01/2014 257  190 - 270 umol/L Final    Physical Examination:  BP 140/82 mmHg  Pulse 87  Temp(Src) 98.2 F (36.8 C)  Resp 14  Ht 5' 5.25" (1.657 m)  Wt 248 lb 12.8 oz (112.855 kg)  BMI 41.10 kg/m2  SpO2 99%     ASSESSMENT:  Diabetes type 2, uncontrolled with morbid obesity She is currently being treated with low-dose metformin and Invokana but has significantly high fasting readings Although her sugars are still relatively high in the afternoon her fructosamine indicates improving control recently; Previous A1c was 8.3 Her lab glucose was 139  in the afternoon. She does need to do better with her exercise regimen which has not started; she will start following instructions given by dietitian in 1/16. More blood sugars to be checked fasting which are the highest of the day and also more after supper instead of before eating  MULTINODULAR goiter: She has history of a very large multinodular goiter and still has significant amount of residual substernal tissue which is benign on biopsy.  She is asymptomatic from this and it is difficult to palpate clinically because of substernal location She is  still euthyroid with stopping her 25 g supplement and we can continue to monitor her thyroid levels periodicallyper Discussed that she is not a surgical candidate unless she is having compressive symptoms again  Hypertension: Her blood pressure is slightly better today than on her last visit  Hypokalemia: Since she is not taking her supplement regularly and potassium is 3.4 needs better compliance  PLAN:   She will be given a trial of Amaryl as follows to help overnight blood sugars  Continue metformin and Invokana  Consistent exercise  A1c in 2 months  Start taking potassium daily for hypokalemia  Patient Instructions  Please check blood sugars at least half the time about 2 hours after any meal and 3 times per week on waking up. Please bring blood sugar monitor to each visit. Recommended blood sugar levels about 2 hours after meal is 140-160 and on waking up 90-130  Start Glimeperide 2MG  at supper with 1/2 tab and if am sugar still >150 after 1 week   Counseling time over 50% of today's 25 minute visit    Elinor Kleine 03/04/2014, 1:56 PM   Note: This office note was prepared with Estate agent. Any transcriptional errors that result from this process are unintentional.

## 2014-03-04 NOTE — Patient Instructions (Signed)
Great job in the changes that you have made!! Consider asking daughter in law for subscription to Diabetic Living magazine Keep walking! Consider increasing to 15 minutes every day. Consider decreasing cheese to 1 meal or snack per day.

## 2014-03-04 NOTE — Patient Instructions (Signed)
Please check blood sugars at least half the time about 2 hours after any meal and 3 times per week on waking up. Please bring blood sugar monitor to each visit. Recommended blood sugar levels about 2 hours after meal is 140-160 and on waking up 90-130  Start Glimeperide 2MG  at supper with 1/2 tab and if am sugar still >150 after 1 week

## 2014-03-06 ENCOUNTER — Encounter: Payer: Self-pay | Admitting: Endocrinology

## 2014-03-06 DIAGNOSIS — IMO0002 Reserved for concepts with insufficient information to code with codable children: Secondary | ICD-10-CM | POA: Insufficient documentation

## 2014-03-06 DIAGNOSIS — E1165 Type 2 diabetes mellitus with hyperglycemia: Secondary | ICD-10-CM | POA: Insufficient documentation

## 2014-03-06 DIAGNOSIS — E042 Nontoxic multinodular goiter: Secondary | ICD-10-CM | POA: Insufficient documentation

## 2014-03-24 ENCOUNTER — Telehealth: Payer: Self-pay

## 2014-03-24 NOTE — Telephone Encounter (Signed)
Patient is requesting a letter from Dr Brigitte Pulse to extend her FMLA to August 20th for [employer]. Per patient Dr Brigitte Pulse is aware of this and she is requesting it be faxed to [employer] at [phone]. Patient stated she contact our office in regards to this last week but she has not heard anything. I didn't see any phone messages in her account regarding this. Patient states this information has to be sent prior to March 5th or she will no longer beable to receive benefits. Any questions please contact patient at [phone].

## 2014-03-25 NOTE — Telephone Encounter (Signed)
That should be fine. thanks 

## 2014-03-29 ENCOUNTER — Other Ambulatory Visit: Payer: Self-pay | Admitting: *Deleted

## 2014-04-01 ENCOUNTER — Telehealth: Payer: Self-pay | Admitting: Family Medicine

## 2014-04-01 NOTE — Telephone Encounter (Signed)
Patient called to see if her letter had been faxed to [employer]. I reprinted letter and faxed it again.

## 2014-04-15 ENCOUNTER — Ambulatory Visit (INDEPENDENT_AMBULATORY_CARE_PROVIDER_SITE_OTHER): Payer: BLUE CROSS/BLUE SHIELD | Admitting: Endocrinology

## 2014-04-15 ENCOUNTER — Encounter: Payer: Self-pay | Admitting: Endocrinology

## 2014-04-15 VITALS — BP 142/94 | HR 85 | Temp 98.8°F | Ht 65.25 in | Wt 244.0 lb

## 2014-04-15 DIAGNOSIS — E042 Nontoxic multinodular goiter: Secondary | ICD-10-CM

## 2014-04-15 DIAGNOSIS — IMO0002 Reserved for concepts with insufficient information to code with codable children: Secondary | ICD-10-CM

## 2014-04-15 DIAGNOSIS — E1165 Type 2 diabetes mellitus with hyperglycemia: Secondary | ICD-10-CM

## 2014-04-15 MED ORDER — GLIPIZIDE 5 MG PO TABS: 5.0000 mg | ORAL_TABLET | Freq: Every day | ORAL | Status: DC

## 2014-04-15 NOTE — Progress Notes (Signed)
Patient ID: Donna [name], female   DOB: 1955/11/20, 59 y.o.   MRN: [id number]           Reason for Appointment:  Follow-up for Type 2 Diabetes  Referring physician: Brigitte Pulse  History of Present Illness:          Diagnosis: Type 2 diabetes mellitus, date of diagnosis: 2010      Past history:.  She thinks her diabetes was borderline at onset and may not have been treated right away. Subsequently the patient had been on metformin in various doses She had been tried on as much as 2000 mg of metformin a day but this would tend to cause diarrhea She had been somewhat erratic with her follow-up with various physicians partly because of insurance issues Appears that her A1c has been consistently around 8% since 2015 She was taking metformin 1000 mg ER daily without adequate control and A1c of 8.3 She was started on Invokana 100 mg daily in the mornings on 02/07/14 in addition to her metformin.   Recent history:  She did improve her blood sugar control and weight with starting Invokana However she is consistent having recurrent candida vaginitis since starting Invokana including recently Her weight has improved slightly again Fasting blood sugars are still consistently high and generally higher than in the evenings but has done only a few readings, not clear if she is checking blood sugars consistently after supper as the blood sugars are not tagged She is not able to tolerate more than 1000 mg of metformin ER She was recommended Amaryl on her last visit but she apparently has previous history of abdominal discomfort and does not want to take this No recent A1c available She has seen the dietitian couple of months ago and is trying to make some changes       Oral hypoglycemic drugs the patient is taking are: Metformin ER 1000 mg daily      Side effects from medications have been: Diarrhea from high-dose regular metformin, yeast infections from Invokana 100 mg Hypoglycemia: None    Glucose  monitoring:  done one time a day         Glucometer: Accucheck       Blood Glucose readings by time of day and averages from meter download:  PRE-MEAL Breakfast Lunch Dinner  7 PM  Overall  Glucose range:  149-242    138-169   140-188    Mean/median:  201     155   166    Self-care: The diet that the patient has been following is less fat recently Meals: 3 meals per day. Breakfast is yogurt or biscuit, lunch is a sandwich, fried meat  sometimes         Exercise:  minimal recemtly  Dietician visit, most recent: 02/04/14                Weight history:  Wt Readings from Last 3 Encounters:  04/15/14 244 lb (110.678 kg)  03/04/14 248 lb 12.8 oz (112.855 kg)  02/04/14 255 lb (115.667 kg)    Glycemic control:   Lab Results  Component Value Date   HGBA1C 8.3 01/07/2014   HGBA1C 8.0 09/03/2013   Lab Results  Component Value Date   LDLCALC 100* 09/03/2013   CREATININE 0.61 03/01/2014         Medication List       This list is accurate as of: 04/15/14 11:59 PM.  Always use your most recent med list.  ACCU-CHEK AVIVA PLUS test strip  Generic drug:  glucose blood  1 each by Other route as needed for other. Use as instructed     albuterol 108 (90 BASE) MCG/ACT inhaler  Commonly known as:  PROVENTIL HFA;VENTOLIN HFA  Inhale 2 puffs into the lungs every 4 (four) hours as needed for wheezing or shortness of breath.     canagliflozin 100 MG Tabs tablet  Commonly known as:  INVOKANA  Take 1 tablet (100 mg total) by mouth daily.     cyclobenzaprine 10 MG tablet  Commonly known as:  FLEXERIL  Take 1 tablet (10 mg total) by mouth at bedtime.     ergocalciferol 50000 UNITS capsule  Commonly known as:  VITAMIN D2  Take 1 capsule (50,000 Units total) by mouth once a week.     Fluocinolone Acetonide 0.01 % Oil  Place 5 drops in ear(s) 2 (two) times daily as needed ("eczema in ears").     glipiZIDE 5 MG tablet  Commonly known as:  GLUCOTROL  Take 1 tablet (5 mg  total) by mouth daily before breakfast.     ibuprofen 800 MG tablet  Commonly known as:  ADVIL,MOTRIN  Take 800 mg by mouth every 8 (eight) hours as needed for mild pain or moderate pain.     ipratropium 0.06 % nasal spray  Commonly known as:  ATROVENT  Place 2 sprays into both nostrils 3 (three) times daily as needed for rhinitis.     levothyroxine 25 MCG tablet  Commonly known as:  SYNTHROID, LEVOTHROID     loratadine 10 MG tablet  Commonly known as:  CLARITIN  Take 1 tablet (10 mg total) by mouth daily.     losartan-hydrochlorothiazide 50-12.5 MG per tablet  Commonly known as:  HYZAAR  Take 1 tablet by mouth every morning.     magnesium citrate Soln     metformin 1000 MG (OSM) 24 hr tablet  Commonly known as:  FORTAMET  Take 2 tablets (2,000 mg total) by mouth at bedtime.     metoprolol 50 MG tablet  Commonly known as:  LOPRESSOR  Take 1 tablet (50 mg total) by mouth 2 (two) times daily.     mupirocin ointment 2 %  Commonly known as:  BACTROBAN     pantoprazole 40 MG tablet  Commonly known as:  PROTONIX  Take 40 mg by mouth every evening.     potassium chloride 10 MEQ tablet  Commonly known as:  K-DUR,KLOR-CON  Take 10 mEq by mouth every evening.     ranitidine 150 MG tablet  Commonly known as:  ZANTAC  Take 1 tablet (150 mg total) by mouth 2 (two) times daily.     traMADol 50 MG tablet  Commonly known as:  ULTRAM  Take 1 tablet (50 mg total) by mouth every 8 (eight) hours as needed for moderate pain. For pain     YEAST-GARD ADV HOMEOPATHIC PO  Take by mouth.        Allergies:  Allergies  Allergen Reactions  . Glimepiride Other (See Comments)    Per patient causes severe gas pain  . Morphine And Related Nausea And Vomiting  . Colcrys [Colchicine] Rash  . Lisinopril Rash    Past Medical History  Diagnosis Date  . Hypertension   . Hypothyroid   . Diverticulitis   . Arthritis   . Allergy   . Diabetes mellitus without complication   . History  of bronchitis   . History of gout   .  Skin cancer (melanoma)     to be removed 12/09/13  . GERD (gastroesophageal reflux disease)   . Difficulty sleeping   . Endometrial cancer     Past Surgical History  Procedure Laterality Date  . Cholecystectomy    . Thyroidectomy    . Robotic assisted total hysterectomy with bilateral salpingo oopherectomy Bilateral 11/23/2013    Procedure: ROBOTIC ASSISTED TOTAL HYSTERECTOMY WITH BILATERAL SALPINGO OOPHORECTOMY, LYMPH NODE DISSECTION ,LYSIS OF ADHESIONS,PELVIC WASHINGS ;  Surgeon: Everitt Amber, MD;  Location: [facility];  Service: Gynecology;  Laterality: Bilateral;  . Suture removal N/A 11/23/2013    Procedure: SUTURE REMOVAL;  Surgeon: Everitt Amber, MD;  Location: [facility];  Service: Gynecology;  Laterality: N/A;    Family History  Problem Relation Age of Onset  . Stroke Mother   . Heart disease Mother   . Cancer Father   . Diabetes Father   . Cancer Brother   . Diabetes Paternal Uncle     Social History:  reports that she quit smoking about 6 years ago. She does not have any smokeless tobacco history on file. She reports that she drinks alcohol. She reports that she does not use illicit drugs.    Review of Systems       Most recent eye exam was in 2015       Lipids: She has not been on any statin drugs       Lab Results  Component Value Date   CHOL 178 09/03/2013   HDL 43 09/03/2013   LDLCALC 100* 09/03/2013   TRIG 176* 09/03/2013   CHOLHDL 4.1 09/03/2013                  Thyroid:    Previous history: The patient has a very long history of multinodular goiter, probably since about 2002 She said she had multiple nodules and had periodic ultrasounds and biopsies done for few years Also she had been started on 25 g of levothyroxine and is dose has not been changed since 2002 or so However her goiter continue to enlarge progressively and in 2010 she had a thyroidectomy done She has not had any evidence of thyroid cancer in her biopsy  or surgical pathology  In 8/12 she was referred for an ultrasound because of a mass seen on her CT scan of the chest. Her ultrasound  showed a 5.7 cm mass in the thyroid which is benign on biopsy  Her exam in 1/16 showed a smooth and firm nodule just felt on swallowing in the right medial lobe low in the neck.  Since her TSH was low normal at 0.6 she was told to stop her 25 g levothyroxine in 1/16.   She thinks she feels a little better with this and her TSH is still normal.  Recommendations made:  we can continue to monitor her thyroid levels periodically    Lab Results  Component Value Date   FREET4 0.99 03/01/2014   FREET4 1.15 10/22/2013   FREET4 1.02 12/28/2009   TSH 2.40 03/01/2014   TSH 0.604 01/07/2014   TSH 1.126 10/22/2013       The blood pressure has been elevated since her 30s and is taking Hyzaar and metoprolol for this now        LABS:  No visits with results within 1 Week(s) from this visit. Latest known visit with results is:  Appointment on 03/01/2014  Component Date Value Ref Range Status  . TSH 03/01/2014 2.40  0.35 - 4.50  uIU/mL Final  . Free T4 03/01/2014 0.99  0.60 - 1.60 ng/dL Final  . Sodium 03/01/2014 138  135 - 145 mEq/L Final  . Potassium 03/01/2014 3.4* 3.5 - 5.1 mEq/L Final  . Chloride 03/01/2014 102  96 - 112 mEq/L Final  . CO2 03/01/2014 29  19 - 32 mEq/L Final  . Glucose, Bld 03/01/2014 139* 70 - 99 mg/dL Final  . BUN 03/01/2014 12  6 - 23 mg/dL Final  . Creatinine, Ser 03/01/2014 0.61  0.40 - 1.20 mg/dL Final  . Total Bilirubin 03/01/2014 0.4  0.2 - 1.2 mg/dL Final  . Alkaline Phosphatase 03/01/2014 81  39 - 117 U/L Final  . AST 03/01/2014 49* 0 - 37 U/L Final  . ALT 03/01/2014 44* 0 - 35 U/L Final  . Total Protein 03/01/2014 7.1  6.0 - 8.3 g/dL Final  . Albumin 03/01/2014 4.1  3.5 - 5.2 g/dL Final  . Calcium 03/01/2014 9.1  8.4 - 10.5 mg/dL Final  . GFR 03/01/2014 107.28  >60.00 mL/min Final  . Fructosamine 03/01/2014 257  190 -  270 umol/L Final    Physical Examination:  BP 142/94 mmHg  Pulse 85  Temp(Src) 98.8 F (37.1 C) (Oral)  Ht 5' 5.25" (1.657 m)  Wt 244 lb (110.678 kg)  BMI 40.31 kg/m2  SpO2 98%     ASSESSMENT:  Diabetes type 2, uncontrolled with morbid obesity She is currently being treated with low-dose metformin and Invokana but has persistently high readings, somewhat higher in the morning She will tried glipizide ER since she does not think she can tolerate Amaryl She is not ready to take an injectable drug at this time and she is planning to change practice because of financial issues  Hypertension: Her blood pressure is slightly higher today, to follow-up with PCP  PLAN:   She will be given a trial of glipizide ER 5 mg daily   Continue metformin and Invokana but may hold Invokana until her candidiasis better.  She was recommended Trulicity but she is not wanting to do this diet she wants to change her practice  Consistent exercise     [facility] 04/16/2014, 6:01 PM   Note: This office note was prepared with Estate agent. Any transcriptional errors that result from this process are unintentional.

## 2014-04-15 NOTE — Patient Instructions (Signed)
Start TRULICITYwith the pen as shown once weekly on the same day of the week.  You may inject in the stomach, thigh or arm as indicated in the brochure given.  You will feel fullness of the stomach with starting the medication and should try to keep the portions at meals small.  You may experience nausea in the first few days which usually gets better over time   If any questions or concerns are present call the office or the  [facility] at [phone]. Also visit [email or web address] website for more useful information  Glipizide once daily  Hold invokana

## 2014-04-24 ENCOUNTER — Other Ambulatory Visit: Payer: Self-pay | Admitting: Family Medicine

## 2014-05-05 ENCOUNTER — Encounter: Payer: BLUE CROSS/BLUE SHIELD | Admitting: Dietician

## 2014-05-13 ENCOUNTER — Ambulatory Visit (INDEPENDENT_AMBULATORY_CARE_PROVIDER_SITE_OTHER): Payer: BLUE CROSS/BLUE SHIELD | Admitting: Family Medicine

## 2014-05-13 ENCOUNTER — Encounter: Payer: Self-pay | Admitting: Family Medicine

## 2014-05-13 VITALS — BP 130/72 | HR 79 | Temp 97.8°F | Resp 16 | Ht 65.5 in | Wt 250.8 lb

## 2014-05-13 DIAGNOSIS — K760 Fatty (change of) liver, not elsewhere classified: Secondary | ICD-10-CM

## 2014-05-13 DIAGNOSIS — E049 Nontoxic goiter, unspecified: Secondary | ICD-10-CM

## 2014-05-13 DIAGNOSIS — G2581 Restless legs syndrome: Secondary | ICD-10-CM | POA: Diagnosis not present

## 2014-05-13 DIAGNOSIS — R252 Cramp and spasm: Secondary | ICD-10-CM | POA: Diagnosis not present

## 2014-05-13 DIAGNOSIS — G478 Other sleep disorders: Secondary | ICD-10-CM

## 2014-05-13 DIAGNOSIS — E559 Vitamin D deficiency, unspecified: Secondary | ICD-10-CM | POA: Diagnosis not present

## 2014-05-13 DIAGNOSIS — I10 Essential (primary) hypertension: Secondary | ICD-10-CM

## 2014-05-13 DIAGNOSIS — J329 Chronic sinusitis, unspecified: Secondary | ICD-10-CM

## 2014-05-13 DIAGNOSIS — E890 Postprocedural hypothyroidism: Secondary | ICD-10-CM

## 2014-05-13 DIAGNOSIS — M5430 Sciatica, unspecified side: Secondary | ICD-10-CM

## 2014-05-13 DIAGNOSIS — E1165 Type 2 diabetes mellitus with hyperglycemia: Secondary | ICD-10-CM

## 2014-05-13 DIAGNOSIS — L723 Sebaceous cyst: Secondary | ICD-10-CM | POA: Diagnosis not present

## 2014-05-13 DIAGNOSIS — K219 Gastro-esophageal reflux disease without esophagitis: Secondary | ICD-10-CM

## 2014-05-13 LAB — POCT GLYCOSYLATED HEMOGLOBIN (HGB A1C): Hemoglobin A1C: 7.5

## 2014-05-13 MED ORDER — PANTOPRAZOLE SODIUM 40 MG PO TBEC: 40.0000 mg | DELAYED_RELEASE_TABLET | Freq: Every evening | ORAL | Status: DC

## 2014-05-13 MED ORDER — RANITIDINE HCL 150 MG PO TABS: 150.0000 mg | ORAL_TABLET | Freq: Two times a day (BID) | ORAL | Status: DC

## 2014-05-13 MED ORDER — GLIPIZIDE 5 MG PO TABS: 2.5000 mg | ORAL_TABLET | Freq: Every day | ORAL | Status: DC

## 2014-05-13 MED ORDER — POTASSIUM CHLORIDE CRYS ER 10 MEQ PO TBCR: 10.0000 meq | EXTENDED_RELEASE_TABLET | Freq: Every evening | ORAL | Status: DC

## 2014-05-13 MED ORDER — METFORMIN HCL ER (OSM) 1000 MG PO TB24: 2000.0000 mg | ORAL_TABLET | Freq: Every day | ORAL | Status: DC

## 2014-05-13 MED ORDER — CYCLOBENZAPRINE HCL 10 MG PO TABS: 10.0000 mg | ORAL_TABLET | Freq: Every day | ORAL | Status: DC

## 2014-05-13 MED ORDER — TRAMADOL HCL 50 MG PO TABS: 50.0000 mg | ORAL_TABLET | Freq: Three times a day (TID) | ORAL | Status: DC | PRN

## 2014-05-13 NOTE — Progress Notes (Addendum)
Subjective:    Patient ID: Donna [name], female    DOB: 1955-10-08, 59 y.o.   MRN: [id number] This chart was scribed for [name], MD by Zola Button, Medical Scribe. This patient was seen in Room 24 and the patient's care was started at 3:37 PM.   Chief Complaint  Patient presents with  . Follow-up    diabetes  . Medication Refill    tramadol    HPI HPI Comments: Donna [name] is a 59 y.o. female with a hx of endometrial cancer, vitamin D deficiency, postoperative hypothyroidism, multinodular goiter, DM and HTN who presents to the [facility] for a follow-up. She had a complete hysterectomy with BSO for endometrial CA 6 mos prior.  Was referred to Dr. Dwyane Dee for consult on regrowth of thyroid goiter and DM.    Fatty liver: LFTs had continued to be elevated in her last labs. No abdominal imaging has been made.  Diabetes: Dr. Dwyane Dee added Invokana 100 mg in addition to metformin ER 1000 mg without significant improvement, so 1 month ago added in glipizide ER.  She has been trying to take her CBG at home once a day. Her blood sugar levels have been high in the morning. She notes that she did not have much improvement to her blood sugar on Invokana and also had recurrent yeast infections as well as urinary frequency. She did not have any bladder infections. Patient does report diarrhea with the metformin. However, she has been having regular bowel movements (2 a day). She denies diaphoresis at night. She does not think Dr. Dwyane Dee is a good match for her so is going to transition her DM care back to PCP (me); she had difficulty understanding him as he had difficulty explaining things to her in layman's terms. Patient has been taking 1 glipizide in the mornings and 1 metformin at night.  Hypothyroidism: TSH normal 2 months ago. Dr. Dwyane Dee felt like her thyroid tissue was asymptomatic, no further treatment needed.  Patient has not had any problems with her thyroid. She did discontinue  the levothyroxine sev mos ago as instructed.  Leg Cramps: She has still been having leg cramps every once in a while that seem to be more often with cold weather changes.  Cyst: Patient reports having a cyst to the back of her neck that sometimes increases in size. Seen at [facility] dermatologist prior  Health Maintenance: Patient was deficient on vitamin D and was started on high dose replacement. Patient has been taking her vitamin D and calcium supplements. She has not set up a colonoscopy yet. She denies indigestion/heartburn symptoms.  Immunizations: Patient received her flu shot at work.    Past Medical History  Diagnosis Date  . Hypertension   . Hypothyroid   . Diverticulitis   . Arthritis   . Allergy   . Diabetes mellitus without complication   . History of bronchitis   . History of gout   . Skin cancer (melanoma)     to be removed 12/09/13  . GERD (gastroesophageal reflux disease)   . Difficulty sleeping   . Endometrial cancer    Current Outpatient Prescriptions on File Prior to Visit  Medication Sig Dispense Refill  . ACCU-CHEK AVIVA PLUS test strip USE 1 STRIP DAILY FOR GLUCOSE TESTING 100 each 11  . albuterol (PROVENTIL HFA;VENTOLIN HFA) 108 (90 BASE) MCG/ACT inhaler Inhale 2 puffs into the lungs every 4 (four) hours as needed for wheezing or shortness of breath.  1 Inhaler 11  . ergocalciferol (VITAMIN D2) 50000 UNITS capsule Take 1 capsule (50,000 Units total) by mouth once a week. 12 capsule 1  . Fluocinolone Acetonide 0.01 % OIL Place 5 drops in ear(s) 2 (two) times daily as needed ("eczema in ears").    . Homeopathic Products (YEAST-GARD ADV HOMEOPATHIC PO) Take by mouth.    Marland Kitchen ipratropium (ATROVENT) 0.06 % nasal spray Place 2 sprays into both nostrils 3 (three) times daily as needed for rhinitis.    Marland Kitchen loratadine (CLARITIN) 10 MG tablet Take 1 tablet (10 mg total) by mouth daily. 30 tablet 11  . losartan-hydrochlorothiazide (HYZAAR) 50-12.5 MG per tablet Take 1  tablet by mouth every morning. 90 tablet 3  . metoprolol (LOPRESSOR) 50 MG tablet Take 1 tablet (50 mg total) by mouth 2 (two) times daily. 180 tablet 3  . ACCU-CHEK SOFTCLIX LANCETS lancets USE 1 DAILY FOR GLUCOSE TESTING 100 each 0  . magnesium citrate SOLN   1  . mupirocin ointment (BACTROBAN) 2 %   0   No current facility-administered medications on file prior to visit.   Allergies  Allergen Reactions  . Glimepiride Other (See Comments)    Per patient causes severe gas pain  . Morphine And Related Nausea And Vomiting  . Colcrys [Colchicine] Rash  . Lisinopril Rash     Review of Systems  Constitutional: Negative for fever, chills and diaphoresis.  HENT: Negative for trouble swallowing and voice change.   Gastrointestinal: Negative for nausea, vomiting, abdominal pain, diarrhea and constipation.  Genitourinary: Negative for urgency, frequency, decreased urine volume and difficulty urinating.  Musculoskeletal: Positive for myalgias, back pain and arthralgias. Negative for joint swelling and gait problem.  Skin: Positive for rash.  Neurological: Negative for weakness and numbness.  Hematological: Negative for adenopathy. Does not bruise/bleed easily.  Psychiatric/Behavioral: Positive for sleep disturbance. Negative for dysphoric mood. The patient is not nervous/anxious.        Objective:  BP 130/72 mmHg  Pulse 79  Temp(Src) 97.8 F (36.6 C) (Oral)  Resp 16  Ht 5' 5.5" (1.664 m)  Wt 250 lb 12.8 oz (113.762 kg)  BMI 41.09 kg/m2  SpO2 97%  Physical Exam  Constitutional: She is oriented to person, place, and time. She appears well-developed and well-nourished. No distress.  HENT:  Head: Normocephalic and atraumatic.  Mouth/Throat: Oropharynx is clear and moist. No oropharyngeal exudate.  Eyes: Pupils are equal, round, and reactive to light.  Neck: Neck supple.  Cardiovascular: Normal rate, regular rhythm, S1 normal, S2 normal and normal heart sounds.   No murmur  heard. Pulmonary/Chest: Effort normal and breath sounds normal. No respiratory distress. She has no wheezes. She has no rales.  CTAB. Good air movement.  Musculoskeletal: She exhibits no edema.  Neurological: She is alert and oriented to person, place, and time. No cranial nerve deficit.  Skin: Skin is warm and dry. No rash noted.  Psychiatric: She has a normal mood and affect. Her behavior is normal.  Vitals reviewed.         Assessment & Plan:   1. Type 2 diabetes mellitus with hyperglycemia - not tolerating diarrhea on metformin but resolved on ER formulation so will increase from 1000 to 2000 qd.  Pt did not tolerate invokana 100 due to recurrent yeast infections and UTIs and really doesn't want to give self injections so has been doing well on glipizide other than concern for overnight lows w/ dawn phenomenon so use IR glipizide only w/ breakfast and  try small healthy qhs snack. a1c improved today at 7.5 <- 8.3 in 4 mos prior.  2. Essential hypertension, benign   3. Fatty infiltration of liver - needs reimaging if elev transaminases persists as last done in 2007 but transaminases cont to improve w/ nml liver function so watchful waiting for now  4. Goiter - stable off of levothyroxine for sev mos - cont to monitor  5. Postoperative hypothyroidism - has had thyroid tissue regrowth under sternum so now off of levothyroxine - Dr. Ronnie Derby c/s appreciated - does not need further monitoring or trx as long as tsh remains stable and pt w/o signs of obstruction from mass  6. Vitamin D deficiency - slowly improving - cont high dose replacement x 6 mos then otc 2000u/d  7. Sebaceous cyst - on upper back - rec removal when smaller since bothersome to pt - she will RTC for this when needed  8. Restless leg syndrome   9. Poor sleep pattern   10. Gastroesophageal reflux disease, esophagitis presence not specified   11. Muscle cramp, nocturnal - improving w/ prn tums, cont to try prn cyclobenzaprine -  hold off on increasing mag supp due to diarrhea  12. Sciatica, unspecified laterality - tramadol refilled  13. Recurrent sinusitis, unspecified chronicity, unspecified location   14.   HM - Pt reminded she needs to having initial screening colonoscopy - discuss further at f/u.  Orders Placed This Encounter  Procedures  . TSH  . Comprehensive metabolic panel  . Vit D  25 hydroxy (rtn osteoporosis monitoring)  . POCT glycosylated hemoglobin (Hb A1C)  . HM Diabetes Foot Exam    Meds ordered this encounter  Medications  . metformin (FORTAMET) 1000 MG (OSM) 24 hr tablet    Sig: Take 2 tablets (2,000 mg total) by mouth at bedtime.    Dispense:  180 tablet    Refill:  1    Ok to substitute any extended release/long-acting metformin that is least expensive for pt. Please let us know to change rx if needed.  Marland Kitchen glipiZIDE (GLUCOTROL) 5 MG tablet    Sig: Take 0.5 tablets (2.5 mg total) by mouth daily before breakfast.    Dispense:  45 tablet    Refill:  1  . cyclobenzaprine (FLEXERIL) 10 MG tablet    Sig: Take 1 tablet (10 mg total) by mouth at bedtime.    Dispense:  30 tablet    Refill:  3  . pantoprazole (PROTONIX) 40 MG tablet    Sig: Take 1 tablet (40 mg total) by mouth every evening.    Dispense:  90 tablet    Refill:  0  . potassium chloride (K-DUR,KLOR-CON) 10 MEQ tablet    Sig: Take 1 tablet (10 mEq total) by mouth every evening.    Dispense:  90 tablet    Refill:  1  . ranitidine (ZANTAC) 150 MG tablet    Sig: Take 1 tablet (150 mg total) by mouth 2 (two) times daily.    Dispense:  180 tablet    Refill:  1  . traMADol (ULTRAM) 50 MG tablet    Sig: Take 1 tablet (50 mg total) by mouth every 8 (eight) hours as needed for moderate pain. For pain    Dispense:  60 tablet    Refill:  5    I personally performed the services described in this documentation, which was scribed in my presence. The recorded information has been reviewed and considered, and addended by me as needed.  [name], MD MPH  Results for orders placed or performed in visit on 05/13/14  TSH  Result Value Ref Range   TSH 1.046 0.350 - 4.500 uIU/mL  Comprehensive metabolic panel  Result Value Ref Range   Sodium 139 135 - 145 mEq/L   Potassium 3.6 3.5 - 5.3 mEq/L   Chloride 101 96 - 112 mEq/L   CO2 27 19 - 32 mEq/L   Glucose, Bld 190 (H) 70 - 99 mg/dL   BUN 15 6 - 23 mg/dL   Creat 0.66 0.50 - 1.10 mg/dL   Total Bilirubin 0.4 0.2 - 1.2 mg/dL   Alkaline Phosphatase 82 39 - 117 U/L   AST 42 (H) 0 - 37 U/L   ALT 42 (H) 0 - 35 U/L   Total Protein 7.0 6.0 - 8.3 g/dL   Albumin 4.0 3.5 - 5.2 g/dL   Calcium 8.9 8.4 - 10.5 mg/dL  Vit D  25 hydroxy (rtn osteoporosis monitoring)  Result Value Ref Range   Vit D, 25-Hydroxy 23 (L) 30 - 100 ng/mL  POCT glycosylated hemoglobin (Hb A1C)  Result Value Ref Range   Hemoglobin A1C 7.5

## 2014-05-13 NOTE — Patient Instructions (Signed)
Complementary and Alternative Medical Therapies for Diabetes Complementary and alternative medicines are health care practices or products that are not always accepted as part of routine medicine. Complementary medicine is used along with routine medicine (medical therapy). Alternative medicine can sometimes be used instead of routine medicine. Some people use these methods to treat diabetes. While some of these therapies may be effective, others may not be. Some may even be harmful. Patients using these methods need to tell their caregiver. It is important to let your caregivers know what you are doing. Some of these therapies are discussed below. For more information, talk with your caregiver. THERAPIES Acupuncture Acupuncture is done by a professional who inserts needles into certain points on the skin. Some scientists believe that this triggers the release of the body's natural painkillers. It has been shown to relieve long-term (chronic) pain. This may help patients with painful nerve damage caused by diabetes. Biofeedback Biofeedback helps a person become more aware of the body's response to pain. It also helps you learn to deal with the pain. This alternative therapy focuses on relaxation and stress-reduction techniques. Thinking of peaceful mental images (guided imagery) is one technique. Some people believe these images can ease their condition. MEDICATIONS Chromium Several studies report that chromium supplements may improve diabetes control. Chromium helps insulin improve its action. Research is not yet certain. Supplements have not been recommended or approved. Caution is needed if you have kidney (renal) problems. Ginseng There are several types of ginseng plants. American ginseng is used for diabetes studies. Those studies have shown some glucose-lowering effects. Those effects have been seen with fasting and after-meal blood glucose levels. They have also been seen in A1c levels (average  blood glucose levels over a 12-month period). More long-term studies are needed before recommendations for use of ginseng can be made. Magnesium Experts have studied the relationship between magnesium and diabetes for many years. But it is not yet fully understood. Studies suggest that a low amount of magnesium may make blood glucose control worse in type 2 diabetes. Research also shows that a low amount may contribute to certain diabetes complications. One study showed that people who consume more magnesium had less risk of type 2 diabetes. Eating whole grains, nuts, and green leafy vegetables raises the magnesium level. Vanadium Vanadium is a compound found in tiny amounts in plants and animals. Early studies showed that vanadium improved blood glucose levels in animals with type 1 and type 2 diabetes. One study found that when given vanadium, those with diabetes were able to decrease their insulin dosage. Researchers still need to learn how it works in the body to discover any side effects, and to find safe dosages. Cinnamon There have been a couple of studies that seem to indicate cinnamon decreases insulin resistance and increases insulin production. By doing so, it may lower blood glucose. Exact doses are unknown, but it may work best when used in combination with other diabetes medicines. Document Released: 11/11/2006 Document Revised: 04/08/2011 Document Reviewed: 11/24/2008 [facility] Patient Information 2015 [place], [place]. This information is not intended to replace advice given to you by your health care provider. Make sure you discuss any questions you have with your health care provider.   Muscle Cramps and Spasms Muscle cramps and spasms occur when a muscle or muscles tighten and you have no control over this tightening (involuntary muscle contraction). They are a common problem and can develop in any muscle. The most common place is in the calf muscles of the  leg. Both muscle cramps and  muscle spasms are involuntary muscle contractions, but they also have differences:   Muscle cramps are sporadic and painful. They may last a few seconds to a quarter of an hour. Muscle cramps are often more forceful and last longer than muscle spasms.  Muscle spasms may or may not be painful. They may also last just a few seconds or much longer. CAUSES  It is uncommon for cramps or spasms to be due to a serious underlying problem. In many cases, the cause of cramps or spasms is unknown. Some common causes are:   Overexertion.   Overuse from repetitive motions (doing the same thing over and over).   Remaining in a certain position for a long period of time.   Improper preparation, form, or technique while performing a sport or activity.   Dehydration.   Injury.   Side effects of some medicines.   Abnormally low levels of the salts and ions in your blood (electrolytes), especially potassium and calcium. This could happen if you are taking water pills (diuretics) or you are pregnant.  Some underlying medical problems can make it more likely to develop cramps or spasms. These include, but are not limited to:   Diabetes.   Parkinson disease.   Hormone disorders, such as thyroid problems.   Alcohol abuse.   Diseases specific to muscles, joints, and bones.   Blood vessel disease where not enough blood is getting to the muscles.  HOME CARE INSTRUCTIONS   Stay well hydrated. Drink enough water and fluids to keep your urine clear or pale yellow.  It may be helpful to massage, stretch, and relax the affected muscle.  For tight or tense muscles, use a warm towel, heating pad, or hot shower water directed to the affected area.  If you are sore or have pain after a cramp or spasm, applying ice to the affected area may relieve discomfort.  Put ice in a plastic bag.  Place a towel between your skin and the bag.  Leave the ice on for 15-20 minutes, 03-04 times a  day.  Medicines used to treat a known cause of cramps or spasms may help reduce their frequency or severity. Only take over-the-counter or prescription medicines as directed by your caregiver. SEEK MEDICAL CARE IF:  Your cramps or spasms get more severe, more frequent, or do not improve over time.  MAKE SURE YOU:   Understand these instructions.  Will watch your condition.  Will get help right away if you are not doing well or get worse. Document Released: 07/06/2001 Document Revised: 05/11/2012 Document Reviewed: 01/01/2012 [facility] Patient Information 2015 [place], [place]. This information is not intended to replace advice given to you by your health care provider. Make sure you discuss any questions you have with your health care provider.   Restless Legs Syndrome Restless legs syndrome is a movement disorder. It may also be called a sensorimotor disorder.  CAUSES  No one knows what specifically causes restless legs syndrome, but it tends to run in families. It is also more common in people with low iron, in pregnancy, in people who need dialysis, and those with nerve damage (neuropathy).Some medications may make restless legs syndrome worse.Those medications include drugs to treat high blood pressure, some heart conditions, nausea, colds, allergies, and depression. SYMPTOMS Symptoms include uncomfortable sensations in the legs. These leg sensations are worse during periods of inactivity or rest. They are also worse while sitting or lying down. Individuals that have the  disorder describe sensations in the legs that feel like:  Pulling.  Drawing.  Crawling.  Worming.  Boring.  Tingling.  Pins and needles.  Prickling.  Pain. The sensations are usually accompanied by an overwhelming urge to move the legs. Sudden muscle jerks may also occur. Movement provides temporary relief from the discomfort. In rare cases, the arms may also be affected. Symptoms may interfere with going  to sleep (sleep onset insomnia). Restless legs syndrome may also be related to periodic limb movement disorder (PLMD). PLMD is another more common motor disorder. It also causes interrupted sleep. The symptoms from PLMD usually occur most often when you are awake. TREATMENT  Treatment for restless legs syndrome is symptomatic. This means that the symptoms are treated.   Massage and cold compresses may provide temporary relief.  Walk, stretch, or take a cold or hot bath.  Get regular exercise and a good night's sleep.  Avoid caffeine, alcohol, nicotine, and medications that can make it worse.  Do activities that provide mental stimulation like discussions, needlework, and video games. These may be helpful if you are not able to walk or stretch. Some medications are effective in relieving the symptoms. However, many of these medications have side effects. Ask your caregiver about medications that may help your symptoms. Correcting iron deficiency may improve symptoms for some patients. Document Released: 01/04/2002 Document Revised: 05/31/2013 Document Reviewed: 04/12/2010 [facility] Patient Information 2015 [place], [place]. This information is not intended to replace advice given to you by your health care provider. Make sure you discuss any questions you have with your health care provider.

## 2014-05-14 LAB — COMPREHENSIVE METABOLIC PANEL
ALT: 42 U/L — AB (ref 0–35)
AST: 42 U/L — AB (ref 0–37)
Albumin: 4 g/dL (ref 3.5–5.2)
Alkaline Phosphatase: 82 U/L (ref 39–117)
BUN: 15 mg/dL (ref 6–23)
CHLORIDE: 101 meq/L (ref 96–112)
CO2: 27 mEq/L (ref 19–32)
Calcium: 8.9 mg/dL (ref 8.4–10.5)
Creat: 0.66 mg/dL (ref 0.50–1.10)
Glucose, Bld: 190 mg/dL — ABNORMAL HIGH (ref 70–99)
POTASSIUM: 3.6 meq/L (ref 3.5–5.3)
Sodium: 139 mEq/L (ref 135–145)
Total Bilirubin: 0.4 mg/dL (ref 0.2–1.2)
Total Protein: 7 g/dL (ref 6.0–8.3)

## 2014-05-14 LAB — TSH: TSH: 1.046 u[IU]/mL (ref 0.350–4.500)

## 2014-05-14 LAB — VITAMIN D 25 HYDROXY (VIT D DEFICIENCY, FRACTURES): Vit D, 25-Hydroxy: 23 ng/mL — ABNORMAL LOW (ref 30–100)

## 2014-05-15 MED ORDER — ERGOCALCIFEROL 1.25 MG (50000 UT) PO CAPS: 50000.0000 [IU] | ORAL_CAPSULE | ORAL | Status: DC

## 2014-06-08 ENCOUNTER — Telehealth: Payer: Self-pay

## 2014-06-08 NOTE — Telephone Encounter (Signed)
Pt would like to speak with someone regarding her medication, states she had been taking 1 pill, but the bottle said to take 1/2. She is on Metformin and Protonix 40 mg Please call [phone]

## 2014-06-08 NOTE — Telephone Encounter (Signed)
Protonix is once a day for sure. Left message for pt to call back to clarify.

## 2014-06-09 NOTE — Telephone Encounter (Signed)
Called pt, left message to call back.

## 2014-06-09 NOTE — Telephone Encounter (Signed)
Patient is returning a missed phone call. Please call back.

## 2014-06-10 MED ORDER — GLIPIZIDE 5 MG PO TABS: 5.0000 mg | ORAL_TABLET | Freq: Every day | ORAL | Status: DC

## 2014-06-10 NOTE — Telephone Encounter (Signed)
Pt was calling regarding her Glipizide. She has been taking 1 pill instead of 1/2 a pill on accident. She thinks the whole pill is working well for her and would like to continue to take one whole pill instead of 1/2 a pill. Her highest sugar within the past two weeks in the morning was 175 and has been averaging around 119 in the afternoon. Her lowest in the afternoon has been 78. Should patient continue with 1 pill or go to 1/2 a pill like you directed in your notes?

## 2014-06-10 NOTE — Telephone Encounter (Signed)
cbgs sound much better - definitely continue on the 1 tab a day.  New refill w/ corrected sig sent to pharmacy.  Called pt to confirm, a.m. cbgs have been 174, 141 in morning.  113, 126, 119, 78 right before dinner,  Pt instructed to cont glipizide ir 5 qam but start checking cbgs 2 hrs pp super, before bed, and fasting qam - if 2 hr pp and before bed cbgs are >170 as well, we will have her add in a glipizide 2.5mg  30 min before dinner.  Call in 1 wk to let me know.

## 2014-06-10 NOTE — Telephone Encounter (Signed)
Called and left vm with dosage information on her metformin and protonix. Left in VM how often she should be taking each medication.

## 2014-06-17 ENCOUNTER — Ambulatory Visit: Payer: 59 | Admitting: Gynecologic Oncology

## 2014-06-22 ENCOUNTER — Encounter: Payer: Self-pay | Admitting: Gynecologic Oncology

## 2014-06-22 ENCOUNTER — Ambulatory Visit: Payer: BLUE CROSS/BLUE SHIELD | Attending: Gynecologic Oncology | Admitting: Gynecologic Oncology

## 2014-06-22 VITALS — BP 152/83 | HR 70 | Temp 97.8°F | Resp 18 | Ht 65.5 in | Wt 251.5 lb

## 2014-06-22 DIAGNOSIS — Z8542 Personal history of malignant neoplasm of other parts of uterus: Secondary | ICD-10-CM | POA: Diagnosis not present

## 2014-06-22 DIAGNOSIS — C541 Malignant neoplasm of endometrium: Secondary | ICD-10-CM | POA: Insufficient documentation

## 2014-06-22 DIAGNOSIS — E119 Type 2 diabetes mellitus without complications: Secondary | ICD-10-CM | POA: Diagnosis not present

## 2014-06-22 LAB — HM DIABETES EYE EXAM

## 2014-06-22 NOTE — Patient Instructions (Signed)
We will see you back in our office in 6 months. Please call our clinic before then if you have any questions or concerns.

## 2014-06-22 NOTE — Progress Notes (Signed)
Followup Note: Gyn-Onc   Donna [name] 59 y.o. female  Chief Complaint  Patient presents with  . 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 Clinically free of disease.   Plan: Return to see [place] in 6 months. Given the good prognostic features no additional therapy is recommended.  Interval History: In October 2015 she underwent robotic hysterectomy, bilateral salpingo-vitrectomy, and bilateral pelvic lymphadenectomy 11/23/2013. She had an uncomplicated postoperative course. Final pathology showed superficial invasive grade 1 endometrial carcinoma. Adnexa and lymph nodes were negative. She has been struggling with diabetes control. She has resolution of stress urinary incontinence postop. She denies symptoms of recurrent endometrial cancer including vaginal bleeding, pelvic pain, edema.  HPI: Patient initially presented with postmenopausal bleeding and endometrial biopsy showing a grade 1 endometrial carcinoma.She underwent robotic hysterectomy, bilateral salpingo-vitrectomy, and bilateral pelvic lymphadenectomy 11/23/2013. She had an uncomplicated postoperative course. Final pathology showed superficial invasive grade 1 endometrial carcinoma. Adnexa and lymph nodes were negative.   Review of Systems:10 point review of systems is negative except as noted in interval history.   Vitals: Blood pressure 152/83, pulse 70, temperature 97.8 F (36.6 C), temperature source Oral, resp. rate 18, height 5' 5.5" (1.664 m), weight 251 lb 8 oz (114.08 kg), SpO2 99 %.  Physical Exam: General : The patient is a healthy woman in no acute distress.  HEENT: normocephalic, extraoccular movements normal; neck is supple without thyromegally  Lynphnodes: Supraclavicular and inguinal nodes not enlarged  Abdomen: Obese, all incisions are healing well. Soft, non-tender, no ascites, no organomegally, no masses, no hernias  Pelvic:  EGBUS: Normal female  Vagina:  Normal, no lesions , the cuff is healing well. Urethra and Bladder: Normal, non-tender  Cervix: Surgically absent  Uterus: Surgically absent  Bi-manual examination: Non-tender; no adenxal masses or nodularity  Rectal: normal sphincter tone, no masses, no blood  Lower extremities: No edema or varicosities. Normal range of motion      Allergies  Allergen Reactions  . Glimepiride Other (See Comments)    Per patient causes severe gas pain  . Morphine And Related Nausea And Vomiting  . Colcrys [Colchicine] Rash  . Lisinopril Rash    Past Medical History  Diagnosis Date  . Hypertension   . Hypothyroid   . Diverticulitis   . Arthritis   . Allergy   . Diabetes mellitus without complication   . History of bronchitis   . History of gout   . Skin cancer (melanoma)     to be removed 12/09/13  . GERD (gastroesophageal reflux disease)   . Difficulty sleeping   . Endometrial cancer     Past Surgical History  Procedure Laterality Date  . Cholecystectomy    . Thyroidectomy    . Robotic assisted total hysterectomy with bilateral salpingo oopherectomy Bilateral 11/23/2013    Procedure: ROBOTIC ASSISTED TOTAL HYSTERECTOMY WITH BILATERAL SALPINGO OOPHORECTOMY, LYMPH NODE DISSECTION ,LYSIS OF ADHESIONS,PELVIC WASHINGS ;  Surgeon: Everitt Amber, MD;  Location: [facility];  Service: Gynecology;  Laterality: Bilateral;  . Suture removal N/A 11/23/2013    Procedure: SUTURE REMOVAL;  Surgeon: Everitt Amber, MD;  Location: [facility];  Service: Gynecology;  Laterality: N/A;  . Skin cancer removal      melanoma removed from back     Current Outpatient Prescriptions  Medication Sig Dispense Refill  . ACCU-CHEK AVIVA PLUS test strip USE 1 STRIP DAILY FOR GLUCOSE TESTING 100 each 11  . ACCU-CHEK SOFTCLIX LANCETS  lancets USE 1 DAILY FOR GLUCOSE TESTING 100 each 0  . albuterol (PROVENTIL HFA;VENTOLIN HFA) 108 (90 BASE) MCG/ACT inhaler Inhale 2 puffs into the lungs every 4 (four) hours as needed for wheezing or  shortness of breath. 1 Inhaler 11  . cyclobenzaprine (FLEXERIL) 10 MG tablet Take 1 tablet (10 mg total) by mouth at bedtime. 30 tablet 3  . ergocalciferol (VITAMIN D2) 50000 UNITS capsule Take 1 capsule (50,000 Units total) by mouth once a week. 12 capsule 1  . Fluocinolone Acetonide 0.01 % OIL Place 5 drops in ear(s) 2 (two) times daily as needed ("eczema in ears").    Marland Kitchen glipiZIDE (GLUCOTROL) 5 MG tablet Take 1 tablet (5 mg total) by mouth daily before breakfast. 90 tablet 1  . ibuprofen (ADVIL,MOTRIN) 800 MG tablet Take 800 mg by mouth 3 (three) times daily as needed.  1  . loratadine (CLARITIN) 10 MG tablet Take 1 tablet (10 mg total) by mouth daily. 30 tablet 11  . losartan-hydrochlorothiazide (HYZAAR) 50-12.5 MG per tablet Take 1 tablet by mouth every morning. 90 tablet 3  . metformin (FORTAMET) 1000 MG (OSM) 24 hr tablet Take 2 tablets (2,000 mg total) by mouth at bedtime. 180 tablet 1  . metoprolol (LOPRESSOR) 50 MG tablet Take 1 tablet (50 mg total) by mouth 2 (two) times daily. 180 tablet 3  . pantoprazole (PROTONIX) 40 MG tablet Take 1 tablet (40 mg total) by mouth every evening. 90 tablet 0  . potassium chloride (K-DUR,KLOR-CON) 10 MEQ tablet Take 1 tablet (10 mEq total) by mouth every evening. 90 tablet 1  . ranitidine (ZANTAC) 150 MG tablet Take 1 tablet (150 mg total) by mouth 2 (two) times daily. 180 tablet 1  . traMADol (ULTRAM) 50 MG tablet Take 1 tablet (50 mg total) by mouth every 8 (eight) hours as needed for moderate pain. For pain 60 tablet 5  . Homeopathic Products (YEAST-GARD ADV HOMEOPATHIC PO) Take by mouth.    . INVOKANA 100 MG TABS tablet   3  . ipratropium (ATROVENT) 0.06 % nasal spray Place 2 sprays into both nostrils 3 (three) times daily as needed for rhinitis.    . magnesium citrate SOLN   1  . mupirocin ointment (BACTROBAN) 2 %   0   No current facility-administered medications for this visit.    History   Social History  . Marital Status: Single     Spouse Name: N/A  . Number of Children: N/A  . Years of Education: N/A   Occupational History  . Not on file.   Social History Main Topics  . Smoking status: Former Smoker    Quit date: 11/20/2007  . Smokeless tobacco: Not on file  . Alcohol Use: Yes     Comment: occasional  . Drug Use: No  . Sexual Activity: No   Other Topics Concern  . Not on file   Social History Narrative    Family History  Problem Relation Age of Onset  . Stroke Mother   . Heart disease Mother   . Cancer Father   . Diabetes Father   . Cancer Brother   . Diabetes Paternal Veryl Speak, MD 06/22/2014, 10:38 AM

## 2014-06-30 ENCOUNTER — Telehealth: Payer: Self-pay

## 2014-06-30 ENCOUNTER — Encounter: Payer: Self-pay | Admitting: Family Medicine

## 2014-06-30 MED ORDER — GLIPIZIDE 5 MG PO TABS: 5.0000 mg | ORAL_TABLET | Freq: Every day | ORAL | Status: DC

## 2014-06-30 NOTE — Telephone Encounter (Signed)
Can we send in refill? Should we be concerned about her taking a whole pill instead of half? Please advise.

## 2014-06-30 NOTE — Telephone Encounter (Signed)
Pt.notified

## 2014-06-30 NOTE — Telephone Encounter (Signed)
Patient requesting a refill on her "Glipizide 5 mg". Per patient she has only 2 days left. She has been taking a whole pill each day and realized she was only suppose to take a 1/2 pill. Per patient this is why she has ran out early. Patient requesting our office to contact [facility] on [address] to have it filled. Patients call back number is [phone]

## 2014-06-30 NOTE — Telephone Encounter (Signed)
I had sent a 6 month refill of your glipizide on 5/13 when we decided you should take 1 tab a day to the [facility] on [address]. I will send it in again in case it didn't go through for some reason.  Here was the note I had wrote about your sugars after we talked last month.  I sent pt a mychart message as well but please call to confirm.   cbgs sound much better - definitely continue on the 1 tab a day. New refill w/ corrected sig sent to pharmacy. Called pt to confirm, a.m. cbgs have been 174, 141 in morning.  113, 126, 119, 78 right before dinner,  Pt instructed to cont glipizide ir 5 qam but start checking cbgs 2 hrs pp super, before bed, and fasting qam - if 2 hr pp and before bed cbgs are >170 as well, we will have her add in a glipizide 2.5mg  30 min before dinner. Call in 1 wk to let me know.

## 2014-07-06 ENCOUNTER — Encounter: Payer: Self-pay | Admitting: *Deleted

## 2014-07-25 ENCOUNTER — Other Ambulatory Visit: Payer: Self-pay

## 2014-08-08 ENCOUNTER — Telehealth: Payer: Self-pay | Admitting: Physician Assistant

## 2014-08-08 NOTE — Telephone Encounter (Signed)
Pharmacist called to let us know that when she refilled her prescription several days ago, they accidentally dispensed the regular metformin instead of the extended release metformin that was prescribed. They contacted the patient and have now dispensed her the correct medication.  The patient reported to them that her sugars were running a little higher than usual, but she also had not been following the healthy lifestyle practices she usually does, and thinks that is the cause of the elevated home glucose readings.

## 2014-08-12 ENCOUNTER — Encounter: Payer: Self-pay | Admitting: Family Medicine

## 2014-08-12 ENCOUNTER — Ambulatory Visit (INDEPENDENT_AMBULATORY_CARE_PROVIDER_SITE_OTHER): Payer: BLUE CROSS/BLUE SHIELD | Admitting: Family Medicine

## 2014-08-12 VITALS — BP 137/85 | HR 81 | Temp 97.6°F | Resp 16 | Ht 65.5 in | Wt 254.0 lb

## 2014-08-12 DIAGNOSIS — I10 Essential (primary) hypertension: Secondary | ICD-10-CM

## 2014-08-12 DIAGNOSIS — E559 Vitamin D deficiency, unspecified: Secondary | ICD-10-CM | POA: Diagnosis not present

## 2014-08-12 DIAGNOSIS — IMO0002 Reserved for concepts with insufficient information to code with codable children: Secondary | ICD-10-CM

## 2014-08-12 DIAGNOSIS — R197 Diarrhea, unspecified: Secondary | ICD-10-CM | POA: Diagnosis not present

## 2014-08-12 DIAGNOSIS — E1165 Type 2 diabetes mellitus with hyperglycemia: Secondary | ICD-10-CM | POA: Diagnosis not present

## 2014-08-12 DIAGNOSIS — K760 Fatty (change of) liver, not elsewhere classified: Secondary | ICD-10-CM | POA: Diagnosis not present

## 2014-08-12 DIAGNOSIS — E890 Postprocedural hypothyroidism: Secondary | ICD-10-CM

## 2014-08-12 LAB — COMPREHENSIVE METABOLIC PANEL
ALK PHOS: 70 U/L (ref 39–117)
ALT: 39 U/L — ABNORMAL HIGH (ref 0–35)
AST: 41 U/L — ABNORMAL HIGH (ref 0–37)
Albumin: 4.1 g/dL (ref 3.5–5.2)
BUN: 16 mg/dL (ref 6–23)
CO2: 28 mEq/L (ref 19–32)
Calcium: 8.9 mg/dL (ref 8.4–10.5)
Chloride: 101 mEq/L (ref 96–112)
Creat: 0.64 mg/dL (ref 0.50–1.10)
GLUCOSE: 96 mg/dL (ref 70–99)
POTASSIUM: 4.1 meq/L (ref 3.5–5.3)
Sodium: 140 mEq/L (ref 135–145)
Total Bilirubin: 0.4 mg/dL (ref 0.2–1.2)
Total Protein: 7.1 g/dL (ref 6.0–8.3)

## 2014-08-12 LAB — POCT CBC
Granulocyte percent: 59.4 %G (ref 37–80)
HEMATOCRIT: 41 % (ref 37.7–47.9)
HEMOGLOBIN: 14 g/dL (ref 12.2–16.2)
LYMPH, POC: 3.5 — AB (ref 0.6–3.4)
MCH: 29 pg (ref 27–31.2)
MCHC: 34.3 g/dL (ref 31.8–35.4)
MCV: 84.5 fL (ref 80–97)
MID (CBC): 0.7 (ref 0–0.9)
MPV: 7.5 fL (ref 0–99.8)
POC Granulocyte: 6.1 (ref 2–6.9)
POC LYMPH PERCENT: 34 %L (ref 10–50)
POC MID %: 6.6 %M (ref 0–12)
Platelet Count, POC: 369 10*3/uL (ref 142–424)
RBC: 4.84 M/uL (ref 4.04–5.48)
RDW, POC: 13.4 %
WBC: 10.3 10*3/uL — AB (ref 4.6–10.2)

## 2014-08-12 LAB — TSH: TSH: 1.598 u[IU]/mL (ref 0.350–4.500)

## 2014-08-12 LAB — POCT GLYCOSYLATED HEMOGLOBIN (HGB A1C): Hemoglobin A1C: 6.8

## 2014-08-12 LAB — GLUCOSE, POCT (MANUAL RESULT ENTRY): POC Glucose: 86 mg/dl (ref 70–99)

## 2014-08-12 LAB — POCT SEDIMENTATION RATE: POCT SED RATE: 26 mm/hr — AB (ref 0–22)

## 2014-08-12 MED ORDER — FLUOCINOLONE ACETONIDE 0.01 % OT OIL: 5.0000 [drp] | TOPICAL_OIL | Freq: Two times a day (BID) | OTIC | Status: DC | PRN

## 2014-08-12 MED ORDER — METRONIDAZOLE 500 MG PO TABS: 500.0000 mg | ORAL_TABLET | Freq: Three times a day (TID) | ORAL | Status: DC

## 2014-08-12 MED ORDER — METRONIDAZOLE 500 MG PO TABS: 500.0000 mg | ORAL_TABLET | Freq: Four times a day (QID) | ORAL | Status: DC

## 2014-08-12 MED ORDER — CIPROFLOXACIN HCL 750 MG PO TABS: 750.0000 mg | ORAL_TABLET | Freq: Two times a day (BID) | ORAL | Status: DC

## 2014-08-12 MED ORDER — CIPROFLOXACIN HCL 250 MG PO TABS: 500.0000 mg | ORAL_TABLET | Freq: Two times a day (BID) | ORAL | Status: DC

## 2014-08-12 NOTE — Progress Notes (Addendum)
Subjective:  This chart was scribed for Donna Cheadle, MD by Thea Alken, ED Scribe. This patient was seen in room 21 and the patient's care was started at 1:46 PM.   Patient ID: Donna [name], female    DOB: 25-Aug-1955, 59 y.o.   MRN: [id number]  HPI   Chief Complaint  Patient presents with  . Follow-up  . Diabetes  . Diarrhea    x 2 weeks   HPI Comments: Donna [name] is a 59 y.o. female who presents to the [facility] for a follow up.   Diarrhea She has hx of diverticulitis. States 1 week ago she was mildly constipated with pencil thin stools but began to have diarrhea this week. She denies abdominal pain but does have gurgling. States diarrhea is unrelated to intolerance to metformin. She denies fever, chills, nausea, emesis. She has hx of cholecystectomy.   Diabetes I last saw pt 3 months ago. She did not tolerate metformin, causing diarrhea but was able to tolerate ER so increased from 1000 to 2000 daily. Failed invokana due to UTI and not being able to deal with injections. She was on glipizide but was concerned with overnight lows with high morning CBG due to dawn phenomenon, so glipizide was changed from XL to IR and pt was encourage to have a night time snack. a1c improved from 8.3 to 7.5. She was seeing Dr. Dwyane Dee who had started her on invokana but pt elected to take diabetic care back to PCP.   Her sugars have been 150-200 in the morning and 150-270 in the evening. She takes metformin in the morning and in the aftrernoon. 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  She denies urinary symptoms.  Vitamin D deficiency   Low vitamin and was put on prescription vitamin D.   Thyroid She was on thyroid replacement after thyroidectomy but regrew to mediastinum.  Also pt has had liver inflammation possibly due to fatty liver but imaging has not been obtained. Pt had abdominal CT in 2007 that should fatty liver. Ct showed several  sigmoid diverticulitis.    Past Medical History  Diagnosis Date  . Hypertension   . Hypothyroid   . Diverticulitis   . Arthritis   . Allergy   . Diabetes mellitus without complication   . History of bronchitis   . History of gout   . Skin cancer (melanoma)     to be removed 12/09/13  . GERD (gastroesophageal reflux disease)   . Difficulty sleeping   . Endometrial cancer    Past Surgical History  Procedure Laterality Date  . Cholecystectomy    . Thyroidectomy    . Robotic assisted total hysterectomy with bilateral salpingo oopherectomy Bilateral 11/23/2013    Procedure: ROBOTIC ASSISTED TOTAL HYSTERECTOMY WITH BILATERAL SALPINGO OOPHORECTOMY, LYMPH NODE DISSECTION ,LYSIS OF ADHESIONS,PELVIC WASHINGS ;  Surgeon: Everitt Amber, MD;  Location: [facility];  Service: Gynecology;  Laterality: Bilateral;  . Suture removal N/A 11/23/2013    Procedure: SUTURE REMOVAL;  Surgeon: Everitt Amber, MD;  Location: [facility];  Service: Gynecology;  Laterality: N/A;  . Skin cancer removal      melanoma removed from back    Prior to Admission medications   Medication Sig Start Date End Date Taking? Authorizing Provider  ACCU-CHEK AVIVA PLUS test strip USE 1 STRIP DAILY FOR GLUCOSE TESTING 04/24/14   Shawnee Knapp, MD  ACCU-CHEK SOFTCLIX LANCETS lancets USE 1  DAILY FOR GLUCOSE TESTING 04/24/14   Shawnee Knapp, MD  albuterol (PROVENTIL HFA;VENTOLIN HFA) 108 (90 BASE) MCG/ACT inhaler Inhale 2 puffs into the lungs every 4 (four) hours as needed for wheezing or shortness of breath. 09/03/13   Shawnee Knapp, MD  cyclobenzaprine (FLEXERIL) 10 MG tablet Take 1 tablet (10 mg total) by mouth at bedtime. 05/13/14   Shawnee Knapp, MD  ergocalciferol (VITAMIN D2) 50000 UNITS capsule Take 1 capsule (50,000 Units total) by mouth once a week. 05/15/14   Shawnee Knapp, MD  Fluocinolone Acetonide 0.01 % OIL Place 5 drops in ear(s) 2 (two) times daily as needed ("eczema in ears").    Historical Provider, MD  glipiZIDE (GLUCOTROL) 5 MG tablet Take 1  tablet (5 mg total) by mouth daily before breakfast. 06/30/14   Shawnee Knapp, MD  Homeopathic Products (YEAST-GARD ADV HOMEOPATHIC PO) Take by mouth.    Historical Provider, MD  ibuprofen (ADVIL,MOTRIN) 800 MG tablet Take 800 mg by mouth 3 (three) times daily as needed. 05/23/14   Historical Provider, MD  INVOKANA 100 MG TABS tablet  04/11/14   Historical Provider, MD  ipratropium (ATROVENT) 0.06 % nasal spray Place 2 sprays into both nostrils 3 (three) times daily as needed for rhinitis.    Historical Provider, MD  loratadine (CLARITIN) 10 MG tablet Take 1 tablet (10 mg total) by mouth daily. 01/07/14   Shawnee Knapp, MD  losartan-hydrochlorothiazide (HYZAAR) 50-12.5 MG per tablet Take 1 tablet by mouth every morning. 01/07/14   Shawnee Knapp, MD  magnesium citrate SOLN  10/14/13   Historical Provider, MD  metformin (FORTAMET) 1000 MG (OSM) 24 hr tablet Take 2 tablets (2,000 mg total) by mouth at bedtime. 05/13/14   Shawnee Knapp, MD  metoprolol (LOPRESSOR) 50 MG tablet Take 1 tablet (50 mg total) by mouth 2 (two) times daily. 01/07/14   Shawnee Knapp, MD  mupirocin ointment Drue Stager) 2 %  12/09/13   Historical Provider, MD  pantoprazole (PROTONIX) 40 MG tablet Take 1 tablet (40 mg total) by mouth every evening. 05/13/14   Shawnee Knapp, MD  potassium chloride (K-DUR,KLOR-CON) 10 MEQ tablet Take 1 tablet (10 mEq total) by mouth every evening. 05/13/14   Shawnee Knapp, MD  ranitidine (ZANTAC) 150 MG tablet Take 1 tablet (150 mg total) by mouth 2 (two) times daily. 05/13/14   Shawnee Knapp, MD  traMADol (ULTRAM) 50 MG tablet Take 1 tablet (50 mg total) by mouth every 8 (eight) hours as needed for moderate pain. For pain 05/13/14   Shawnee Knapp, MD   Review of Systems  Constitutional: Negative for fever, chills, activity change, appetite change and unexpected weight change.  Respiratory: Negative for chest tightness and shortness of breath.   Cardiovascular: Negative for chest pain.  Gastrointestinal: Positive for diarrhea and  constipation. Negative for nausea, vomiting, abdominal pain, blood in stool, abdominal distention and anal bleeding.  Endocrine: Negative for cold intolerance, polydipsia, polyphagia and polyuria.  Genitourinary: Negative for dysuria, urgency, frequency and enuresis.  Psychiatric/Behavioral: Negative for sleep disturbance and dysphoric mood. The patient is not nervous/anxious.    Objective:   Physical Exam  Constitutional: She is oriented to person, place, and time. She appears well-developed and well-nourished. No distress.  HENT:  Head: Normocephalic and atraumatic.  Eyes: Conjunctivae and EOM are normal.  Neck: Neck supple.  Cardiovascular: Normal rate, S1 normal, S2 normal and normal heart sounds.   No murmur heard. Pulmonary/Chest: Effort normal  and breath sounds normal. No respiratory distress. She has no wheezes. She has no rales. She exhibits no tenderness.  Abdominal: She exhibits no distension and no mass. Bowel sounds are increased. There is no hepatosplenomegaly.  Hyperactive bowel sounds. Slightly tympanic. No organomegaly palpable.   Musculoskeletal: Normal range of motion.  Neurological: She is alert and oriented to person, place, and time.  Skin: Skin is warm and dry.  Psychiatric: She has a normal mood and affect. Her behavior is normal.  Nursing note and vitals reviewed.   Filed Vitals:   08/12/14 1353  BP: 137/85  Pulse: 81  Temp: 97.6 F (36.4 C)  Resp: 16  Height: 5' 5.5" (1.664 m)  Weight: 254 lb (115.214 kg)   Assessment & Plan:   1. Type II diabetes mellitus, uncontrolled - improved from 7.5 3 mos ago to 6.8 today - doing great!!!  2. Vitamin D deficiency - still low, cont high dose replacement  3. Postoperative hypothyroidism - needs for levothyrxine replacement resolved due to thyroid regrowth in mediastinum  4. Essential hypertension, benign   5. Fatty infiltration of liver - lfts cont to slowly improve - consider imaging to confirm  6. Diarrhea -  suspect IBS due to pt's hx so cover w/ below abx but if sxs persist or worsen will order CT    Orders Placed This Encounter  Procedures  . Comprehensive metabolic panel  . TSH  . Vit D  25 hydroxy (rtn osteoporosis monitoring)  . POCT glycosylated hemoglobin (Hb A1C)  . POCT glucose (manual entry)  . POCT CBC  . POCT SEDIMENTATION RATE    Meds ordered this encounter  Medications  . Fluocinolone Acetonide 0.01 % OIL    Sig: Place 5 drops in ear(s) 2 (two) times daily as needed ("eczema in ears").    Dispense:  20 mL    Refill:  5  . DISCONTD: ciprofloxacin (CIPRO) 250 MG tablet    Sig: Take 2 tablets (500 mg total) by mouth 2 (two) times daily.    Dispense:  14 tablet    Refill:  0  . DISCONTD: metroNIDAZOLE (FLAGYL) 500 MG tablet    Sig: Take 1 tablet (500 mg total) by mouth 3 (three) times daily.    Dispense:  21 tablet    Refill:  0  . ciprofloxacin (CIPRO) 750 MG tablet    Sig: Take 1 tablet (750 mg total) by mouth 2 (two) times daily.    Dispense:  14 tablet    Refill:  0  . metroNIDAZOLE (FLAGYL) 500 MG tablet    Sig: Take 1 tablet (500 mg total) by mouth 4 (four) times daily.    Dispense:  28 tablet    Refill:  0  . ergocalciferol (VITAMIN D2) 50000 UNITS capsule    Sig: Take 1 capsule (50,000 Units total) by mouth once a week.    Dispense:  24 capsule    Refill:  1    I personally performed the services described in this documentation, which was scribed in my presence. The recorded information has been reviewed and considered, and addended by me as needed.  Donna Cheadle, MD MPH  Results for orders placed or performed in visit on 08/12/14  Comprehensive metabolic panel  Result Value Ref Range   Sodium 140 135 - 145 mEq/L   Potassium 4.1 3.5 - 5.3 mEq/L   Chloride 101 96 - 112 mEq/L   CO2 28 19 - 32 mEq/L   Glucose, Bld 96 70 -  99 mg/dL   BUN 16 6 - 23 mg/dL   Creat 0.64 0.50 - 1.10 mg/dL   Total Bilirubin 0.4 0.2 - 1.2 mg/dL   Alkaline Phosphatase 70 39 - 117  U/L   AST 41 (H) 0 - 37 U/L   ALT 39 (H) 0 - 35 U/L   Total Protein 7.1 6.0 - 8.3 g/dL   Albumin 4.1 3.5 - 5.2 g/dL   Calcium 8.9 8.4 - 10.5 mg/dL  TSH  Result Value Ref Range   TSH 1.598 0.350 - 4.500 uIU/mL  Vit D  25 hydroxy (rtn osteoporosis monitoring)  Result Value Ref Range   Vit D, 25-Hydroxy 20 (L) 30 - 100 ng/mL  POCT glycosylated hemoglobin (Hb A1C)  Result Value Ref Range   Hemoglobin A1C 6.8   POCT glucose (manual entry)  Result Value Ref Range   POC Glucose 86 70 - 99 mg/dl  POCT CBC  Result Value Ref Range   WBC 10.3 (A) 4.6 - 10.2 K/uL   Lymph, poc 3.5 (A) 0.6 - 3.4   POC LYMPH PERCENT 34.0 10 - 50 %L   MID (cbc) 0.7 0 - 0.9   POC MID % 6.6 0 - 12 %M   POC Granulocyte 6.1 2 - 6.9   Granulocyte percent 59.4 37 - 80 %G   RBC 4.84 4.04 - 5.48 M/uL   Hemoglobin 14.0 12.2 - 16.2 g/dL   HCT, POC 41.0 37.7 - 47.9 %   MCV 84.5 80 - 97 fL   MCH, POC 29.0 27 - 31.2 pg   MCHC 34.3 31.8 - 35.4 g/dL   RDW, POC 13.4 %   Platelet Count, POC 369 142 - 424 K/uL   MPV 7.5 0 - 99.8 fL  POCT SEDIMENTATION RATE  Result Value Ref Range   POCT SED RATE 26 (A) 0 - 22 mm/hr

## 2014-08-12 NOTE — Patient Instructions (Signed)
Diverticulitis Diverticulitis is inflammation or infection of small pouches in your colon that form when you have a condition called diverticulosis. The pouches in your colon are called diverticula. Your colon, or large intestine, is where water is absorbed and stool is formed. Complications of diverticulitis can include:  Bleeding.  Severe infection.  Severe pain.  Perforation of your colon.  Obstruction of your colon. CAUSES  Diverticulitis is caused by bacteria. Diverticulitis happens when stool becomes trapped in diverticula. This allows bacteria to grow in the diverticula, which can lead to inflammation and infection. RISK FACTORS People with diverticulosis are at risk for diverticulitis. Eating a diet that does not include enough fiber from fruits and vegetables may make diverticulitis more likely to develop. SYMPTOMS  Symptoms of diverticulitis may include:  Abdominal pain and tenderness. The pain is normally located on the left side of the abdomen, but may occur in other areas.  Fever and chills.  Bloating.  Cramping.  Nausea.  Vomiting.  Constipation.  Diarrhea.  Blood in your stool. DIAGNOSIS  Your health care provider will ask you about your medical history and do a physical exam. You may need to have tests done because many medical conditions can cause the same symptoms as diverticulitis. Tests may include:  Blood tests.  Urine tests.  Imaging tests of the abdomen, including X-rays and CT scans. When your condition is under control, your health care provider may recommend that you have a colonoscopy. A colonoscopy can show how severe your diverticula are and whether something else is causing your symptoms. TREATMENT  Most cases of diverticulitis are mild and can be treated at home. Treatment may include:  Taking over-the-counter pain medicines.  Following a clear liquid diet.  Taking antibiotic medicines by mouth for 7-10 days. More severe cases may  be treated at a hospital. Treatment may include:  Not eating or drinking.  Taking prescription pain medicine.  Receiving antibiotic medicines through an IV tube.  Receiving fluids and nutrition through an IV tube.  Surgery. HOME CARE INSTRUCTIONS   Follow your health care provider's instructions carefully.  Follow a full liquid diet or other diet as directed by your health care provider. After your symptoms improve, your health care provider may tell you to change your diet. He or she may recommend you eat a high-fiber diet. Fruits and vegetables are good sources of fiber. Fiber makes it easier to pass stool.  Take fiber supplements or probiotics as directed by your health care provider.  Only take medicines as directed by your health care provider.  Keep all your follow-up appointments. SEEK MEDICAL CARE IF:   Your pain does not improve.  You have a hard time eating food.  Your bowel movements do not return to normal. SEEK IMMEDIATE MEDICAL CARE IF:   Your pain becomes worse.  Your symptoms do not get better.  Your symptoms suddenly get worse.  You have a fever.  You have repeated vomiting.  You have bloody or black, tarry stools. MAKE SURE YOU:   Understand these instructions.  Will watch your condition.  Will get help right away if you are not doing well or get worse. Document Released: 10/24/2004 Document Revised: 01/19/2013 Document Reviewed: 12/09/2012 [facility] Patient Information 2015 [place], [place]. This information is not intended to replace advice given to you by your health care provider. Make sure you discuss any questions you have with your health care provider. Clear Liquid Diet A clear liquid diet is a short-term diet that is prescribed  to provide the necessary fluid and basic energy you need when you can have nothing else. The clear liquid diet consists of liquids or solids that will become liquid at room temperature. You should be able to see  through the liquid. There are many reasons that you may be restricted to clear liquids, such as:  When you have a sudden-onset (acute) condition that occurs before or after surgery.  To help your body slowly get adjusted to food again after a long period when you were unable to have food.  Replacement of fluids when you have a diarrheal disease.  When you are going to have certain exams, such as a colonoscopy, in which instruments are inserted inside your body to look at parts of your digestive system. WHAT CAN I HAVE? A clear liquid diet does not provide all the nutrients you need. It is important to choose a variety of the following items to get as many nutrients as possible:  Vegetable juices that do not have pulp.  Fruit juices and fruit drinks that do not have pulp.  Coffee (regular or decaffeinated), tea, or soda at the discretion of your health care provider.  Clear bouillon, broth, or strained broth-based soups.  High-protein and flavored gelatins.  Sugar or honey.  Ices or frozen ice pops that do not contain milk. If you are not sure whether you can have certain items, you should ask your health care provider. You may also ask your health care provider if there are any other clear liquid options. Document Released: 01/14/2005 Document Revised: 01/19/2013 Document Reviewed: 12/11/2012 [facility] Patient Information 2015 [place], [place]. This information is not intended to replace advice given to you by your health care provider. Make sure you discuss any questions you have with your health care provider.

## 2014-08-13 LAB — VITAMIN D 25 HYDROXY (VIT D DEFICIENCY, FRACTURES): Vit D, 25-Hydroxy: 20 ng/mL — ABNORMAL LOW (ref 30–100)

## 2014-08-13 MED ORDER — ERGOCALCIFEROL 1.25 MG (50000 UT) PO CAPS
50000.0000 [IU] | ORAL_CAPSULE | ORAL | Status: DC
Start: 2014-08-13 — End: 2015-02-02

## 2014-10-19 ENCOUNTER — Other Ambulatory Visit: Payer: Self-pay | Admitting: Family Medicine

## 2014-11-26 ENCOUNTER — Other Ambulatory Visit: Payer: Self-pay | Admitting: Family Medicine

## 2014-11-29 ENCOUNTER — Other Ambulatory Visit: Payer: Self-pay | Admitting: Family Medicine

## 2014-12-01 ENCOUNTER — Other Ambulatory Visit: Payer: Self-pay | Admitting: Family Medicine

## 2014-12-15 ENCOUNTER — Ambulatory Visit (INDEPENDENT_AMBULATORY_CARE_PROVIDER_SITE_OTHER): Payer: BLUE CROSS/BLUE SHIELD | Admitting: Family Medicine

## 2014-12-15 ENCOUNTER — Encounter: Payer: Self-pay | Admitting: Family Medicine

## 2014-12-15 VITALS — BP 150/88 | HR 82 | Temp 98.2°F | Resp 18 | Ht 65.5 in | Wt 253.0 lb

## 2014-12-15 DIAGNOSIS — E118 Type 2 diabetes mellitus with unspecified complications: Secondary | ICD-10-CM

## 2014-12-15 DIAGNOSIS — K760 Fatty (change of) liver, not elsewhere classified: Secondary | ICD-10-CM

## 2014-12-15 DIAGNOSIS — E559 Vitamin D deficiency, unspecified: Secondary | ICD-10-CM | POA: Diagnosis not present

## 2014-12-15 DIAGNOSIS — I10 Essential (primary) hypertension: Secondary | ICD-10-CM

## 2014-12-15 DIAGNOSIS — J301 Allergic rhinitis due to pollen: Secondary | ICD-10-CM

## 2014-12-15 DIAGNOSIS — E890 Postprocedural hypothyroidism: Secondary | ICD-10-CM | POA: Diagnosis not present

## 2014-12-15 LAB — COMPREHENSIVE METABOLIC PANEL
ALBUMIN: 4.4 g/dL (ref 3.6–5.1)
ALT: 39 U/L — ABNORMAL HIGH (ref 6–29)
AST: 46 U/L — ABNORMAL HIGH (ref 10–35)
Alkaline Phosphatase: 79 U/L (ref 33–130)
BILIRUBIN TOTAL: 0.6 mg/dL (ref 0.2–1.2)
BUN: 11 mg/dL (ref 7–25)
CALCIUM: 9.4 mg/dL (ref 8.6–10.4)
CHLORIDE: 98 mmol/L (ref 98–110)
CO2: 26 mmol/L (ref 20–31)
CREATININE: 0.55 mg/dL (ref 0.50–1.05)
Glucose, Bld: 108 mg/dL — ABNORMAL HIGH (ref 65–99)
Potassium: 3.8 mmol/L (ref 3.5–5.3)
SODIUM: 137 mmol/L (ref 135–146)
TOTAL PROTEIN: 7.6 g/dL (ref 6.1–8.1)

## 2014-12-15 LAB — POCT GLYCOSYLATED HEMOGLOBIN (HGB A1C): HEMOGLOBIN A1C: 7.6

## 2014-12-15 LAB — HEMOGLOBIN A1C: HEMOGLOBIN A1C: 7.6 % — AB (ref 4.0–6.0)

## 2014-12-15 MED ORDER — TRAMADOL HCL 50 MG PO TABS: 50.0000 mg | ORAL_TABLET | Freq: Three times a day (TID) | ORAL | Status: DC | PRN

## 2014-12-15 MED ORDER — CYCLOBENZAPRINE HCL 10 MG PO TABS: 10.0000 mg | ORAL_TABLET | Freq: Every day | ORAL | Status: DC

## 2014-12-15 MED ORDER — MONTELUKAST SODIUM 10 MG PO TABS: 10.0000 mg | ORAL_TABLET | Freq: Every day | ORAL | Status: DC

## 2014-12-15 NOTE — Patient Instructions (Signed)
Stop the D in the claritin - it is driving your blood pressure up.  Try the singulair instead.  Or you could use Afrin for less than 3d.  Or a humidifier or mentholated nasal inhaler When you decrease the metformin to 1, take 1/2 tab of glipizide that night with dinner.

## 2014-12-15 NOTE — Progress Notes (Signed)
Subjective:    Patient ID: Donna [name], female    DOB: 07/25/1955, 59 y.o.   MRN: [id number] Chief Complaint  Patient presents with  . Follow-up    3 month   HPI  Is having diarrhea from metformin and so cuts down to 1 at night when it gets bad - then will go up to 2 at night Very stressed - not checking sugars - no lows, sugars seem high. No urinary sys, takes ca-vit D 2 tabs bid invokana caused UTI - can't deal with injection  Weather, allergies, sticky puffy eyes - [facility]   Swelling int and using compression hose - having ache in bone that wakes her up at night -   Using claritin D right now due to congsetion  Past Medical History  Diagnosis Date  . Hypertension   . Hypothyroid   . Diverticulitis   . Arthritis   . Allergy   . Diabetes mellitus without complication ([place])   . History of bronchitis   . History of gout   . Skin cancer (melanoma) ([place])     to be removed 12/09/13  . GERD (gastroesophageal reflux disease)   . Difficulty sleeping   . Endometrial cancer [facility])    Past Surgical History  Procedure Laterality Date  . Cholecystectomy    . Thyroidectomy    . Robotic assisted total hysterectomy with bilateral salpingo oopherectomy Bilateral 11/23/2013    Procedure: ROBOTIC ASSISTED TOTAL HYSTERECTOMY WITH BILATERAL SALPINGO OOPHORECTOMY, LYMPH NODE DISSECTION ,LYSIS OF ADHESIONS,PELVIC WASHINGS ;  Surgeon: Everitt Amber, MD;  Location: [facility];  Service: Gynecology;  Laterality: Bilateral;  . Suture removal N/A 11/23/2013    Procedure: SUTURE REMOVAL;  Surgeon: Everitt Amber, MD;  Location: [facility];  Service: Gynecology;  Laterality: N/A;  . Skin cancer removal      melanoma removed from back    Current Outpatient Prescriptions on File Prior to Visit  Medication Sig Dispense Refill  . ACCU-CHEK AVIVA PLUS test strip USE 1 STRIP DAILY FOR GLUCOSE TESTING 100 each 11  . ACCU-CHEK SOFTCLIX LANCETS lancets USE 1 DAILY FOR GLUCOSE TESTING 100 each 0    . albuterol (PROVENTIL HFA;VENTOLIN HFA) 108 (90 BASE) MCG/ACT inhaler Inhale 2 puffs into the lungs every 4 (four) hours as needed for wheezing or shortness of breath. 1 Inhaler 11  . ergocalciferol (VITAMIN D2) 50000 UNITS capsule Take 1 capsule (50,000 Units total) by mouth once a week. 24 capsule 1  . Fluocinolone Acetonide 0.01 % OIL Place 5 drops in ear(s) 2 (two) times daily as needed ("eczema in ears"). 20 mL 5  . glipiZIDE (GLUCOTROL) 5 MG tablet Take 1 tablet (5 mg total) by mouth daily before breakfast. 90 tablet 1  . ibuprofen (ADVIL,MOTRIN) 800 MG tablet Take 800 mg by mouth 3 (three) times daily as needed.  1  . ipratropium (ATROVENT) 0.06 % nasal spray Place 2 sprays into both nostrils 3 (three) times daily as needed for rhinitis.    Marland Kitchen loratadine (CLARITIN) 10 MG tablet Take 1 tablet (10 mg total) by mouth daily. 30 tablet 11  . metformin (FORTAMET) 1000 MG (OSM) 24 hr tablet TAKE 2 TABLETS BY MOUTH AT BEDTIME. 180 tablet 0  . metoprolol (LOPRESSOR) 50 MG tablet Take 1 tablet (50 mg total) by mouth 2 (two) times daily. 180 tablet 3  . pantoprazole (PROTONIX) 40 MG tablet Take 1 tablet (40 mg total) by mouth every evening. PATIENT NEEDS OFFICE VISIT FOR ADDITIONAL  REFILLS 90 tablet 0  . potassium chloride (K-DUR,KLOR-CON) 10 MEQ tablet Take 1 tablet (10 mEq total) by mouth every evening. (Patient not taking: Reported on 12/21/2014) 90 tablet 1  . ranitidine (ZANTAC) 150 MG tablet Take 1 tablet (150 mg total) by mouth 2 (two) times daily. 180 tablet 1   No current facility-administered medications on file prior to visit.   Allergies  Allergen Reactions  . Glimepiride Other (See Comments)    Per patient causes severe gas pain  . Morphine And Related Nausea And Vomiting  . Colcrys [Colchicine] Rash  . Lisinopril Rash   Family History  Problem Relation Age of Onset  . Stroke Mother   . Heart disease Mother   . Cancer Father   . Diabetes Father   . Cancer Brother   . Diabetes  Paternal Uncle    Social History   Social History  . Marital Status: Single    Spouse Name: N/A  . Number of Children: N/A  . Years of Education: N/A   Social History Main Topics  . Smoking status: Former Smoker    Quit date: 11/20/2007  . Smokeless tobacco: None  . Alcohol Use: No     Comment: occasional  . Drug Use: No  . Sexual Activity: No   Other Topics Concern  . None   Social History Narrative     Review of Systems  Constitutional: Positive for fatigue. Negative for fever, chills, diaphoresis and appetite change.  HENT: Positive for congestion, postnasal drip, rhinorrhea and sinus pressure.   Eyes: Positive for discharge and itching. Negative for photophobia, pain, redness and visual disturbance.  Respiratory: Negative for cough and shortness of breath.   Cardiovascular: Positive for leg swelling. Negative for chest pain and palpitations.  Gastrointestinal: Positive for nausea and diarrhea. Negative for vomiting, abdominal pain, constipation and abdominal distention.  Endocrine: Negative for polydipsia and polyuria.  Genitourinary: Negative for dysuria, urgency, frequency, hematuria and decreased urine volume.  Musculoskeletal: Positive for myalgias and arthralgias. Negative for gait problem.  Neurological: Negative for syncope and headaches.  Hematological: Does not bruise/bleed easily.  Psychiatric/Behavioral: Positive for dysphoric mood. Negative for self-injury. The patient is not nervous/anxious.        Objective:  BP 150/88 mmHg  Pulse 82  Temp(Src) 98.2 F (36.8 C) (Oral)  Resp 18  Ht 5' 5.5" (1.664 m)  Wt 253 lb (114.76 kg)  BMI 41.45 kg/m2  SpO2 95%  Physical Exam  Constitutional: She is oriented to person, place, and time. She appears well-developed and well-nourished. No distress.  HENT:  Head: Normocephalic and atraumatic.  Right Ear: Tympanic membrane, external ear and ear canal normal.  Left Ear: Tympanic membrane, external ear and ear  canal normal.  Nose: Mucosal edema and rhinorrhea present.  Mouth/Throat: Uvula is midline and mucous membranes are normal. No trismus in the jaw. No uvula swelling. Posterior oropharyngeal edema and posterior oropharyngeal erythema present. No oropharyngeal exudate.  Eyes: Conjunctivae are normal. Right eye exhibits no discharge. Left eye exhibits no discharge. No scleral icterus.  Neck: Normal range of motion. Neck supple. No thyromegaly present.  Cardiovascular: Normal rate, regular rhythm, normal heart sounds and intact distal pulses.   Pulmonary/Chest: Effort normal and breath sounds normal. No respiratory distress.  Abdominal: Soft. Bowel sounds are normal. She exhibits no distension and no mass. There is no tenderness. There is no rebound and no guarding.  Musculoskeletal: She exhibits no edema.  Lymphadenopathy:    She has no cervical adenopathy.  Neurological: She is alert and oriented to person, place, and time.  Skin: Skin is warm and dry. She is not diaphoretic. No erythema.  Psychiatric: She has a normal mood and affect. Her behavior is normal.           Assessment & Plan:  [facility] so will hold off on refill of chronic meds until we receive a request - then ok to refill all x 1 yr.  1. Type 2 diabetes mellitus with complication, without long-term current use of insulin (HCC) - prev saw Dr. Toy Care, could not tolerate trulicity or invokana and use of high dose metformin limited to severe GI sxs/diarrhea. Cont ER metformin and glipizide - pt req to work on tlc for now before increasing glipizide due to weight gain side effect.  2. Vitamin D deficiency - increase otc vit D.- currently on combo w/ ca bid.  3. Fatty infiltration of liver   4. Essential hypertension, benign   5. 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  Last seen by endocrine Dr. Dwyane Dee 03/2014.  6.       Allergic rhinitis - add in Singulair, cont zyrtec and flonase  Orders Placed This Encounter  Procedures  . VITAMIN D 25 Hydroxy (Vit-D Deficiency, Fractures)  . Comprehensive metabolic panel  . TSH  . POCT glycosylated hemoglobin (Hb A1C)    Meds ordered this encounter  Medications  . cyclobenzaprine (FLEXERIL) 10 MG tablet    Sig: Take 1 tablet (10 mg total) by mouth at bedtime.    Dispense:  30 tablet    Refill:  3  . traMADol (ULTRAM) 50 MG tablet    Sig: Take 1 tablet (50 mg total) by mouth every 8 (eight) hours as needed.    Dispense:  90 tablet    Refill:  3    This request is for a new prescription for a controlled substance as required by Federal/State law..  . montelukast (SINGULAIR) 10 MG tablet    Sig: Take 1 tablet (10 mg total) by mouth at bedtime.    Dispense:  30 tablet    Refill:  3    [name], MD MPH  Results for orders placed or performed in visit on 12/15/14  VITAMIN D 25 Hydroxy (Vit-D Deficiency, Fractures)  Result Value Ref Range   Vit D, 25-Hydroxy 27 (L) 30 - 100 ng/mL  Comprehensive metabolic panel  Result Value Ref Range   Sodium 137 135 - 146 mmol/L   Potassium 3.8 3.5 - 5.3 mmol/L   Chloride 98 98 - 110 mmol/L   CO2 26 20 - 31 mmol/L   Glucose, Bld 108 (H) 65 - 99 mg/dL   BUN 11 7 - 25 mg/dL   Creat 0.55 0.50 - 1.05 mg/dL   Total Bilirubin 0.6 0.2 - 1.2 mg/dL   Alkaline Phosphatase 79 33 - 130 U/L   AST 46 (H) 10 - 35 U/L   ALT 39 (H) 6 - 29 U/L   Total Protein 7.6 6.1 - 8.1 g/dL   Albumin 4.4 3.6 - 5.1 g/dL   Calcium 9.4 8.6 - 10.4 mg/dL  TSH  Result Value Ref Range   TSH 1.639 0.350 - 4.500 uIU/mL  POCT glycosylated hemoglobin (Hb A1C)  Result Value Ref Range   Hemoglobin A1C 7.6

## 2014-12-16 ENCOUNTER — Ambulatory Visit: Payer: BLUE CROSS/BLUE SHIELD | Admitting: Family Medicine

## 2014-12-16 LAB — VITAMIN D 25 HYDROXY (VIT D DEFICIENCY, FRACTURES): Vit D, 25-Hydroxy: 27 ng/mL — ABNORMAL LOW (ref 30–100)

## 2014-12-16 LAB — TSH: TSH: 1.639 u[IU]/mL (ref 0.350–4.500)

## 2014-12-21 ENCOUNTER — Encounter: Payer: Self-pay | Admitting: Gynecologic Oncology

## 2014-12-21 ENCOUNTER — Ambulatory Visit: Payer: BLUE CROSS/BLUE SHIELD | Attending: Gynecologic Oncology | Admitting: Gynecologic Oncology

## 2014-12-21 DIAGNOSIS — C541 Malignant neoplasm of endometrium: Secondary | ICD-10-CM | POA: Diagnosis not present

## 2014-12-21 NOTE — Progress Notes (Signed)
Followup Note: Gyn-Onc   Donna [name] 59 y.o. female  Chief Complaint  Patient presents with  . 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 Clinically free of disease.   Plan: Return to see Dr Toney Rakes for surveillance and well woman care in 6 months and [place] in 12 months.   Interval History: In October 2015 she underwent robotic hysterectomy, bilateral salpingo-oophorectomy, and bilateral pelvic lymphadenectomy 11/23/2013. She had an uncomplicated postoperative course. Final pathology showed superficial invasive grade 1 endometrial carcinoma. Adnexa and lymph nodes were negative. She did not meet criteria for adjuvant therapy due to low risk features in the specimen. She has been struggling with diabetes control. She has resolution of stress urinary incontinence postop. She denies symptoms of recurrent endometrial cancer including vaginal bleeding, pelvic pain, edema.  HPI: Patient initially presented with postmenopausal bleeding and endometrial biopsy showing a grade 1 endometrial carcinoma.She underwent robotic hysterectomy, bilateral salpingo-vitrectomy, and bilateral pelvic lymphadenectomy 11/23/2013. She had an uncomplicated postoperative course. Final pathology showed superficial invasive grade 1 endometrial carcinoma. Adnexa and lymph nodes were negative.   Review of Systems:10 point review of systems is negative except as noted in interval history.   Vitals: There were no vitals taken for this visit.  Physical Exam: General : The patient is a healthy woman in no acute distress.  HEENT: normocephalic, extraoccular movements normal; neck is supple without thyromegally  Lynphnodes: Supraclavicular and inguinal nodes not enlarged  Abdomen: Obese, all incisions are healing well. Soft, non-tender, no ascites, no organomegally, no masses, no hernias  Pelvic:  EGBUS: Normal female  Vagina: Normal, no lesions , the cuff is healing  well. Urethra and Bladder: Normal, non-tender  Cervix: Surgically absent  Uterus: Surgically absent  Bi-manual examination: Non-tender; no adenxal masses or nodularity  Rectal: normal sphincter tone, no masses, no blood  Lower extremities: No edema or varicosities. Normal range of motion      Allergies  Allergen Reactions  . Glimepiride Other (See Comments)    Per patient causes severe gas pain  . Morphine And Related Nausea And Vomiting  . Colcrys [Colchicine] Rash  . Lisinopril Rash    Past Medical History  Diagnosis Date  . Hypertension   . Hypothyroid   . Diverticulitis   . Arthritis   . Allergy   . Diabetes mellitus without complication ([place])   . History of bronchitis   . History of gout   . Skin cancer (melanoma) ([place])     to be removed 12/09/13  . GERD (gastroesophageal reflux disease)   . Difficulty sleeping   . Endometrial cancer [facility])     Past Surgical History  Procedure Laterality Date  . Cholecystectomy    . Thyroidectomy    . Robotic assisted total hysterectomy with bilateral salpingo oopherectomy Bilateral 11/23/2013    Procedure: ROBOTIC ASSISTED TOTAL HYSTERECTOMY WITH BILATERAL SALPINGO OOPHORECTOMY, LYMPH NODE DISSECTION ,LYSIS OF ADHESIONS,PELVIC WASHINGS ;  Surgeon: Everitt Amber, MD;  Location: [facility];  Service: Gynecology;  Laterality: Bilateral;  . Suture removal N/A 11/23/2013    Procedure: SUTURE REMOVAL;  Surgeon: Everitt Amber, MD;  Location: [facility];  Service: Gynecology;  Laterality: N/A;  . Skin cancer removal      melanoma removed from back     Current Outpatient Prescriptions  Medication Sig Dispense Refill  . ACCU-CHEK AVIVA PLUS test strip USE 1 STRIP DAILY FOR GLUCOSE TESTING 100 each 11  . ACCU-CHEK SOFTCLIX  LANCETS lancets USE 1 DAILY FOR GLUCOSE TESTING 100 each 0  . albuterol (PROVENTIL HFA;VENTOLIN HFA) 108 (90 BASE) MCG/ACT inhaler Inhale 2 puffs into the lungs every 4 (four) hours as needed for wheezing or shortness of breath. 1  Inhaler 11  . cyclobenzaprine (FLEXERIL) 10 MG tablet Take 1 tablet (10 mg total) by mouth at bedtime. 30 tablet 3  . ergocalciferol (VITAMIN D2) 50000 UNITS capsule Take 1 capsule (50,000 Units total) by mouth once a week. 24 capsule 1  . Fluocinolone Acetonide 0.01 % OIL Place 5 drops in ear(s) 2 (two) times daily as needed ("eczema in ears"). 20 mL 5  . glipiZIDE (GLUCOTROL) 5 MG tablet Take 1 tablet (5 mg total) by mouth daily before breakfast. 90 tablet 1  . ibuprofen (ADVIL,MOTRIN) 800 MG tablet Take 800 mg by mouth 3 (three) times daily as needed.  1  . loratadine (CLARITIN) 10 MG tablet Take 1 tablet (10 mg total) by mouth daily. 30 tablet 11  . losartan-hydrochlorothiazide (HYZAAR) 50-12.5 MG per tablet Take 1 tablet by mouth every morning. 90 tablet 3  . metformin (FORTAMET) 1000 MG (OSM) 24 hr tablet TAKE 2 TABLETS BY MOUTH AT BEDTIME. 180 tablet 0  . metoprolol (LOPRESSOR) 50 MG tablet Take 1 tablet (50 mg total) by mouth 2 (two) times daily. 180 tablet 3  . montelukast (SINGULAIR) 10 MG tablet Take 1 tablet (10 mg total) by mouth at bedtime. 30 tablet 3  . pantoprazole (PROTONIX) 40 MG tablet Take 1 tablet (40 mg total) by mouth every evening. PATIENT NEEDS OFFICE VISIT FOR ADDITIONAL REFILLS 90 tablet 0  . ranitidine (ZANTAC) 150 MG tablet Take 1 tablet (150 mg total) by mouth 2 (two) times daily. 180 tablet 1  . traMADol (ULTRAM) 50 MG tablet Take 1 tablet (50 mg total) by mouth every 8 (eight) hours as needed. 90 tablet 3  . ipratropium (ATROVENT) 0.06 % nasal spray Place 2 sprays into both nostrils 3 (three) times daily as needed for rhinitis.    . potassium chloride (K-DUR,KLOR-CON) 10 MEQ tablet Take 1 tablet (10 mEq total) by mouth every evening. (Patient not taking: Reported on 12/21/2014) 90 tablet 1   No current facility-administered medications for this visit.    Social History   Social History  . Marital Status: Single    Spouse Name: N/A  . Number of Children: N/A   . Years of Education: N/A   Occupational History  . Not on file.   Social History Main Topics  . Smoking status: Former Smoker    Quit date: 11/20/2007  . Smokeless tobacco: Not on file  . Alcohol Use: No     Comment: occasional  . Drug Use: No  . Sexual Activity: No   Other Topics Concern  . Not on file   Social History Narrative    Family History  Problem Relation Age of Onset  . Stroke Mother   . Heart disease Mother   . Cancer Father   . Diabetes Father   . Cancer Brother   . Diabetes Paternal Veryl Speak, MD 12/21/2014, 10:20 AM

## 2014-12-21 NOTE — Patient Instructions (Signed)
Plan to follow up with Dr. Toney Rakes in six months and Dr. Denman George in one year.  Please call our office at [phone] after you see Dr. Toney Rakes to schedule your appt with Dr. Denman George.

## 2014-12-28 ENCOUNTER — Other Ambulatory Visit: Payer: Self-pay | Admitting: Family Medicine

## 2015-01-06 ENCOUNTER — Other Ambulatory Visit: Payer: Self-pay | Admitting: Family Medicine

## 2015-01-09 NOTE — Telephone Encounter (Signed)
Dr Brigitte Pulse, I don't see that you have Rxd for pt before, but you have seen pt recently and are PCP. Do you want to Rx?

## 2015-01-10 DIAGNOSIS — J301 Allergic rhinitis due to pollen: Secondary | ICD-10-CM | POA: Insufficient documentation

## 2015-01-30 ENCOUNTER — Other Ambulatory Visit: Payer: Self-pay | Admitting: Family Medicine

## 2015-02-02 ENCOUNTER — Other Ambulatory Visit: Payer: Self-pay

## 2015-02-02 MED ORDER — ERGOCALCIFEROL 1.25 MG (50000 UT) PO CAPS: 50000.0000 [IU] | ORAL_CAPSULE | ORAL | Status: DC

## 2015-02-02 MED ORDER — MONTELUKAST SODIUM 10 MG PO TABS: 10.0000 mg | ORAL_TABLET | Freq: Every day | ORAL | Status: DC

## 2015-02-02 NOTE — Telephone Encounter (Signed)
Got a new faxed request from [facility] asking for 90 day Rx of tramadol. Dr Brigitte Pulse, you sent in Rx locally on 11/17 for #90 w/ 3 RFs (4 months total). Do you want to OK a 90 day RF to mail order, or just a 30 day with 1 RF (what would be left from 11/17 Rx?

## 2015-02-04 MED ORDER — TRAMADOL HCL 50 MG PO TABS: 50.0000 mg | ORAL_TABLET | Freq: Three times a day (TID) | ORAL | Status: DC | PRN

## 2015-02-04 NOTE — Telephone Encounter (Signed)
Please ensure this is printed out for my sig and then can be [facility]ed [facility] to [facility]. Thanks.

## 2015-02-04 NOTE — Telephone Encounter (Signed)
Ok to fax in 1 90d refill of the tramadol to the [facility] - I guess #270 pills with no refills. Thanks.

## 2015-02-07 ENCOUNTER — Telehealth: Payer: Self-pay

## 2015-02-07 NOTE — Telephone Encounter (Signed)
[facility] from [facility] is calling to request verbal or e-scribe for 90 day plus 3 refills for tramodol 50mg , montelukast sodium 10mg , vitamin d 2 1.25 mg capsul in 50,000 [employer]. She states to please include all relevent information Fax: [phone]

## 2015-02-13 ENCOUNTER — Telehealth: Payer: Self-pay

## 2015-02-13 NOTE — Telephone Encounter (Signed)
Pt is trying to get all her meds [facility] will  [facility] needs to be [facility]  Best number [phone] ok to leave a message

## 2015-02-14 NOTE — Telephone Encounter (Signed)
Called Rx's in to [facility].

## 2015-02-14 NOTE — Telephone Encounter (Signed)
Left message letting pt know. 

## 2015-02-16 ENCOUNTER — Telehealth: Payer: Self-pay

## 2015-02-16 NOTE — Telephone Encounter (Signed)
Patient needs a refill for glipizide, losartan, and radiance, sent to [facility].

## 2015-02-17 MED ORDER — RANITIDINE HCL 150 MG PO TABS: 150.0000 mg | ORAL_TABLET | Freq: Two times a day (BID) | ORAL | Status: DC

## 2015-02-17 MED ORDER — GLIPIZIDE 5 MG PO TABS: 5.0000 mg | ORAL_TABLET | Freq: Every day | ORAL | Status: DC

## 2015-02-17 MED ORDER — LOSARTAN POTASSIUM-HCTZ 50-12.5 MG PO TABS: 1.0000 | ORAL_TABLET | Freq: Every morning | ORAL | Status: DC

## 2015-02-17 NOTE — Telephone Encounter (Signed)
Rxs sent

## 2015-02-26 ENCOUNTER — Other Ambulatory Visit: Payer: Self-pay | Admitting: Family Medicine

## 2015-03-23 ENCOUNTER — Ambulatory Visit (INDEPENDENT_AMBULATORY_CARE_PROVIDER_SITE_OTHER): Payer: Managed Care, Other (non HMO) | Admitting: Family Medicine

## 2015-03-23 VITALS — BP 122/86 | HR 91 | Temp 98.3°F | Resp 18 | Ht 65.5 in | Wt 247.0 lb

## 2015-03-23 DIAGNOSIS — J069 Acute upper respiratory infection, unspecified: Secondary | ICD-10-CM | POA: Diagnosis not present

## 2015-03-23 MED ORDER — ALBUTEROL SULFATE (2.5 MG/3ML) 0.083% IN NEBU
2.5000 mg | INHALATION_SOLUTION | Freq: Once | RESPIRATORY_TRACT | Status: AC
  Administered 2015-03-23: 2.5 mg via RESPIRATORY_TRACT

## 2015-03-23 MED ORDER — HYDROCODONE-HOMATROPINE 5-1.5 MG/5ML PO SYRP: 5.0000 mL | ORAL_SOLUTION | ORAL | Status: DC | PRN

## 2015-03-23 NOTE — Progress Notes (Signed)
Subjective:    Patient ID: Donna [name], female    DOB: 1955/06/05, 60 y.o.   MRN: [id number] By signing my name below, I, Donna [name], attest that this documentation has been prepared under the direction and in the presence of [name], MD.  Electronically Signed: Zola [name], Medical Scribe. 03/23/2015. 8:40 AM.  Chief Complaint  Patient presents with  . Cough  . Nasal Congestion   HPI HPI Comments: NILKA Donna [name] is a 60 y.o. female who presents to the [facility] complaining of gradual onset dry cough that started 4 days ago. The cough has mostly improved now. Patient reports having associated abdominal soreness due to cough, congestion, dizziness, SOB, and wheezing. She notes that one of her coworkers had been sick. Patient denies fever and chills; however, she did have night sweats last night.  I last saw Donna [name] 3 months ago. A1c was up to 7.6 and liver tests were elevated as well. She was going to work harder on diet and exercise and so declined adjusting medications. She cannot tolerate Trulicity, Invokana, or high-dose metformin. She got her flu shot at work. Patient notes her blood sugars have not been good lately.  Past Medical History  Diagnosis Date  . Hypertension   . Hypothyroid   . Diverticulitis   . Arthritis   . Allergy   . Diabetes mellitus without complication ([place])   . History of bronchitis   . History of gout   . Skin cancer (melanoma) ([place])     to be removed 12/09/13  . GERD (gastroesophageal reflux disease)   . Difficulty sleeping   . Endometrial cancer [facility])    Past Surgical History  Procedure Laterality Date  . Cholecystectomy    . Thyroidectomy    . Robotic assisted total hysterectomy with bilateral salpingo oopherectomy Bilateral 11/23/2013    Procedure: ROBOTIC ASSISTED TOTAL HYSTERECTOMY WITH BILATERAL SALPINGO OOPHORECTOMY, LYMPH NODE DISSECTION ,LYSIS OF ADHESIONS,PELVIC WASHINGS ;  Surgeon: Everitt Amber, MD;  Location: [facility];   Service: Gynecology;  Laterality: Bilateral;  . Suture removal N/A 11/23/2013    Procedure: SUTURE REMOVAL;  Surgeon: Everitt Amber, MD;  Location: [facility];  Service: Gynecology;  Laterality: N/A;  . Skin cancer removal      melanoma removed from back    Current Outpatient Prescriptions on File Prior to Visit  Medication Sig Dispense Refill  . ACCU-CHEK AVIVA PLUS test strip USE 1 STRIP DAILY FOR GLUCOSE TESTING 100 each 11  . ACCU-CHEK SOFTCLIX LANCETS lancets USE 1 DAILY FOR GLUCOSE TESTING 100 each 0  . cyclobenzaprine (FLEXERIL) 10 MG tablet Take 1 tablet (10 mg total) by mouth at bedtime. 30 tablet 3  . ergocalciferol (VITAMIN D2) 50000 units capsule Take 1 capsule (50,000 Units total) by mouth once a week. 12 capsule 3  . Fluocinolone Acetonide 0.01 % OIL Place 5 drops in ear(s) 2 (two) times daily as needed ("eczema in ears"). 20 mL 5  . glipiZIDE (GLUCOTROL) 5 MG tablet Take 1 tablet (5 mg total) by mouth daily before breakfast. 90 tablet 0  . ibuprofen (ADVIL,MOTRIN) 800 MG tablet TAKE 1 TABLET BY MOUTH 3 TIMES A DAY AS NEEDED 60 tablet 1  . loratadine (CLARITIN) 10 MG tablet Take 1 tablet (10 mg total) by mouth daily. 30 tablet 11  . losartan-hydrochlorothiazide (HYZAAR) 50-12.5 MG tablet Take 1 tablet by mouth every morning. 90 tablet 0  . metformin (FORTAMET) 1000 MG (OSM) 24 hr tablet TAKE 2 TABLETS  BY MOUTH AT BEDTIME. 180 tablet 0  . metoprolol (LOPRESSOR) 50 MG tablet Take 1 tablet (50 mg total) by mouth 2 (two) times daily. 180 tablet 3  . montelukast (SINGULAIR) 10 MG tablet Take 1 tablet (10 mg total) by mouth at bedtime. 90 tablet 3  . pantoprazole (PROTONIX) 40 MG tablet TAKE 1 TABLET IN THE EVENING 90 tablet 0  . potassium chloride (K-DUR,KLOR-CON) 10 MEQ tablet Take 1 tablet (10 mEq total) by mouth every evening. 90 tablet 1  . ranitidine (ZANTAC) 150 MG tablet Take 1 tablet (150 mg total) by mouth 2 (two) times daily. 180 tablet 0  . traMADol (ULTRAM) 50 MG tablet Take 1  tablet (50 mg total) by mouth every 8 (eight) hours as needed. 270 tablet 0  . albuterol (PROVENTIL HFA;VENTOLIN HFA) 108 (90 BASE) MCG/ACT inhaler Inhale 2 puffs into the lungs every 4 (four) hours as needed for wheezing or shortness of breath. (Patient not taking: Reported on 03/23/2015) 1 Inhaler 11  . ipratropium (ATROVENT) 0.06 % nasal spray Place 2 sprays into both nostrils 3 (three) times daily as needed for rhinitis. Reported on 03/23/2015     No current facility-administered medications on file prior to visit.   Allergies  Allergen Reactions  . Glimepiride Other (See Comments)    Per patient causes severe gas pain  . Morphine And Related Nausea And Vomiting  . Colcrys [Colchicine] Rash  . Lisinopril Rash   Family History  Problem Relation Age of Onset  . Stroke Mother   . Heart disease Mother   . Cancer Father   . Diabetes Father   . Cancer Brother   . Diabetes Paternal Uncle    Social History   Social History  . Marital Status: Single    Spouse Name: N/A  . Number of Children: N/A  . Years of Education: N/A   Social History Main Topics  . Smoking status: Former Smoker    Quit date: 11/20/2007  . Smokeless tobacco: None  . Alcohol Use: No     Comment: occasional  . Drug Use: No  . Sexual Activity: No   Other Topics Concern  . None   Social History Narrative    Review of Systems  Constitutional: Positive for diaphoresis. Negative for fever and chills.  HENT: Positive for congestion, postnasal drip, rhinorrhea and sore throat. Negative for sinus pressure, trouble swallowing and voice change.   Respiratory: Positive for cough, shortness of breath and wheezing.   Gastrointestinal: Positive for abdominal pain. Negative for vomiting.  Neurological: Positive for dizziness and light-headedness. Negative for weakness.  Psychiatric/Behavioral: Negative for sleep disturbance.       Objective:  BP 122/86 mmHg  Pulse 91  Temp(Src) 98.3 F (36.8 C)  Resp 18  Ht  5' 5.5" (1.664 m)  Wt 247 lb (112.038 kg)  BMI 40.46 kg/m2  SpO2 97%  Physical Exam  Constitutional: She is oriented to person, place, and time. She appears well-developed and well-nourished. No distress.  HENT:  Head: Normocephalic and atraumatic.  Right Ear: Tympanic membrane is injected. A middle ear effusion is present.  Left Ear: Tympanic membrane is injected. A middle ear effusion is present.  Nose: Nose normal.  Mouth/Throat: Oropharynx is clear and moist. No oropharyngeal exudate, posterior oropharyngeal edema or posterior oropharyngeal erythema.  Eyes: Pupils are equal, round, and reactive to light.  Neck: Neck supple.  Cardiovascular: Normal rate.  Exam reveals distant heart sounds.   Pulmonary/Chest: Effort normal.  Decreased air movement.  Musculoskeletal: She exhibits no edema.  Neurological: She is alert and oriented to person, place, and time. No cranial nerve deficit.  Skin: Skin is warm and dry. No rash noted.  Psychiatric: She has a normal mood and affect. Her behavior is normal.  Nursing note and vitals reviewed.  S/p neb treatment, pulmonary exam had increased air movement, lungs clear, but only mild improvement of symptoms of chest tightness.       Assessment & Plan:  She has an appointment to recheck with me in 1 month and advised patient that we will be planning to start a DPP4 inhibitor then. Reports her daytime sugars are running 150s-180s and she is frequently skipping meals. We reviewed diabetic diet changes and troubleshooting.  1. Acute upper respiratory infection     Meds ordered this encounter  Medications  . albuterol (PROVENTIL) (2.5 MG/3ML) 0.083% nebulizer solution 2.5 mg    Sig:   . HYDROcodone-homatropine (HYCODAN) 5-1.5 MG/5ML syrup    Sig: Take 5 mLs by mouth every 4 (four) hours as needed for cough.    Dispense:  180 mL    Refill:  0    I personally performed the services described in this documentation, which was scribed in my  presence. The recorded information has been reviewed and considered, and addended by me as needed.  [name], MD MPH

## 2015-03-23 NOTE — Patient Instructions (Addendum)
[employer] - get low carb snacks (snacks without flour, wheat, and corn) At your next visit, we are going to consider starting you on Januvia, Onglyza, or Tradjenta (whichever is cheapest by your insurance.  Upper Respiratory Infection, Adult Most upper respiratory infections (URIs) are a viral infection of the air passages leading to the lungs. A URI affects the nose, throat, and upper air passages. The most common type of URI is nasopharyngitis and is typically referred to as "the common cold." URIs run their course and usually go away on their own. Most of the time, a URI does not require medical attention, but sometimes a bacterial infection in the upper airways can follow a viral infection. This is called a secondary infection. Sinus and middle ear infections are common types of secondary upper respiratory infections. Bacterial pneumonia can also complicate a URI. A URI can worsen asthma and chronic obstructive pulmonary disease (COPD). Sometimes, these complications can require emergency medical care and may be life threatening.  CAUSES Almost all URIs are caused by viruses. A virus is a type of germ and can spread from one person to another.  RISKS FACTORS You may be at risk for a URI if:   You smoke.   You have chronic heart or lung disease.  You have a weakened defense (immune) system.   You are very young or very old.   You have nasal allergies or asthma.  You work in crowded or poorly ventilated areas.  You work in health care facilities or schools. SIGNS AND SYMPTOMS  Symptoms typically develop 2-3 days after you come in contact with a cold virus. Most viral URIs last 7-10 days. However, viral URIs from the influenza virus (flu virus) can last 14-18 days and are typically more severe. Symptoms may include:   Runny or stuffy (congested) nose.   Sneezing.   Cough.   Sore throat.   Headache.   Fatigue.   Fever.   Loss of appetite.    Pain in your forehead, behind your eyes, and over your cheekbones (sinus pain).  Muscle aches.  DIAGNOSIS  Your health care provider may diagnose a URI by:  Physical exam.  Tests to check that your symptoms are not due to another condition such as:  Strep throat.  Sinusitis.  Pneumonia.  Asthma. TREATMENT  A URI goes away on its own with time. It cannot be cured with medicines, but medicines may be prescribed or recommended to relieve symptoms. Medicines may help:  Reduce your fever.  Reduce your cough.  Relieve nasal congestion. HOME CARE INSTRUCTIONS   Take medicines only as directed by your health care provider.   Gargle warm saltwater or take cough drops to comfort your throat as directed by your health care provider.  Use a warm mist humidifier or inhale steam from a shower to increase air moisture. This may make it easier to breathe.  Drink enough fluid to keep your urine clear or pale yellow.   Eat soups and other clear broths and maintain good nutrition.   Rest as needed.   Return to work when your temperature has returned to normal or as your health care provider advises. You may need to stay home longer to avoid infecting others. You can also use a face mask and careful hand washing to prevent spread of the virus.  Increase the usage of your inhaler if you have asthma.   Do not use any tobacco products, including cigarettes, chewing tobacco, or  electronic cigarettes. If you need help quitting, ask your health care provider. PREVENTION  The best way to protect yourself from getting a cold is to practice good hygiene.   Avoid oral or hand contact with people with cold symptoms.   Wash your hands often if contact occurs.  There is no clear evidence that vitamin C, vitamin E, echinacea, or exercise reduces the chance of developing a cold. However, it is always recommended to get plenty of rest, exercise, and practice good nutrition.  SEEK MEDICAL  CARE IF:   You are getting worse rather than better.   Your symptoms are not controlled by medicine.   You have chills.  You have worsening shortness of breath.  You have brown or red mucus.  You have yellow or brown nasal discharge.  You have pain in your face, especially when you bend forward.  You have a fever.  You have swollen neck glands.  You have pain while swallowing.  You have white areas in the back of your throat. SEEK IMMEDIATE MEDICAL CARE IF:   You have severe or persistent:  Headache.  Ear pain.  Sinus pain.  Chest pain.  You have chronic lung disease and any of the following:  Wheezing.  Prolonged cough.  Coughing up blood.  A change in your usual mucus.  You have a stiff neck.  You have changes in your:  Vision.  Hearing.  Thinking.  Mood. MAKE SURE YOU:   Understand these instructions.  Will watch your condition.  Will get help right away if you are not doing well or get worse.   This information is not intended to replace advice given to you by your health care provider. Make sure you discuss any questions you have with your health care provider.   Document Released: 07/10/2000 Document Revised: 05/31/2014 Document Reviewed: 04/21/2013 Elsevier Interactive Patient Education [employer].

## 2015-04-04 ENCOUNTER — Telehealth: Payer: Self-pay

## 2015-04-04 NOTE — Telephone Encounter (Signed)
[name] - [facility]  They have sent in a refill request on March 4 for metformin and have not heard anything.  [phone]

## 2015-04-05 MED ORDER — METFORMIN HCL ER (OSM) 1000 MG PO TB24: 2000.0000 mg | ORAL_TABLET | Freq: Every day | ORAL | Status: DC

## 2015-04-05 NOTE — Telephone Encounter (Signed)
Rx sent 

## 2015-04-13 ENCOUNTER — Ambulatory Visit: Payer: BLUE CROSS/BLUE SHIELD | Admitting: Family Medicine

## 2015-04-20 ENCOUNTER — Ambulatory Visit (INDEPENDENT_AMBULATORY_CARE_PROVIDER_SITE_OTHER): Payer: Managed Care, Other (non HMO) | Admitting: Family Medicine

## 2015-04-20 ENCOUNTER — Encounter: Payer: Self-pay | Admitting: Family Medicine

## 2015-04-20 VITALS — BP 141/81 | HR 76 | Temp 97.9°F | Resp 18 | Ht 66.0 in | Wt 251.0 lb

## 2015-04-20 DIAGNOSIS — L723 Sebaceous cyst: Secondary | ICD-10-CM

## 2015-04-20 DIAGNOSIS — E559 Vitamin D deficiency, unspecified: Secondary | ICD-10-CM

## 2015-04-20 DIAGNOSIS — K760 Fatty (change of) liver, not elsewhere classified: Secondary | ICD-10-CM

## 2015-04-20 DIAGNOSIS — E1165 Type 2 diabetes mellitus with hyperglycemia: Secondary | ICD-10-CM

## 2015-04-20 DIAGNOSIS — I10 Essential (primary) hypertension: Secondary | ICD-10-CM | POA: Diagnosis not present

## 2015-04-20 DIAGNOSIS — E890 Postprocedural hypothyroidism: Secondary | ICD-10-CM

## 2015-04-20 LAB — POCT GLYCOSYLATED HEMOGLOBIN (HGB A1C): HEMOGLOBIN A1C: 7.5

## 2015-04-20 LAB — LDL CHOLESTEROL, DIRECT: Direct LDL: 101 mg/dL (ref <130)

## 2015-04-20 MED ORDER — LINAGLIPTIN 5 MG PO TABS: 5.0000 mg | ORAL_TABLET | Freq: Every day | ORAL | Status: DC

## 2015-04-20 MED ORDER — METFORMIN HCL ER (OSM) 1000 MG PO TB24: 2000.0000 mg | ORAL_TABLET | Freq: Every day | ORAL | Status: DC

## 2015-04-20 MED ORDER — GLIPIZIDE 5 MG PO TABS: 5.0000 mg | ORAL_TABLET | Freq: Every day | ORAL | Status: DC

## 2015-04-20 NOTE — Progress Notes (Signed)
Procedure: verbal consent obtained. Alcohol swabbed.  1% Lidocaine with Epi placed at the sebaceous site.  Cleansed with povidine swabs.  11 blade used to place a 1.5 cm incision.  Sebaceous cyst material expressed.  Irrigated with normal saline.  Adhesive tissue removed to gently remove sac.  Irrigated again with saline solution.  5-0 ethilon utilized to do 5 simple interrupted sutures.  Cleansed povidine.  Dressings applied.

## 2015-04-20 NOTE — Patient Instructions (Addendum)
     IF you received an x-ray today, you will receive an invoice from [facility]. Please contact [facility] at [phone] with questions or concerns regarding your invoice.   IF you received labwork today, you will receive an invoice from [employer]. Please contact [facility] at [phone] with questions or concerns regarding your invoice.   Our billing staff will not be able to assist you with questions regarding bills from these companies.  You will be contacted with the lab results as soon as they are available. The fastest way to get your results is to activate your My Chart account. Instructions are located on the last page of this paperwork. If you have not heard from [place] regarding the results in 2 weeks, please contact this office.    She will return in 7 days to [address] for suture removal.    Sebaceous Cyst Removal, Care After Refer to this sheet in the next few weeks. These instructions provide you with information about caring for yourself after your procedure. Your health care provider may also give you more specific instructions. Your treatment has been planned according to current medical practices, but problems sometimes occur. Call your health care provider if you have any problems or questions after your procedure. WHAT TO EXPECT AFTER THE PROCEDURE After your procedure, it is common to have:  Soreness in the area where your cyst was removed.  Tightness or itching from your skin sutures. HOME CARE INSTRUCTIONS  Take medicines only as directed by your health care provider.  If you were prescribed an antibiotic medicine, finish all of it even if you start to feel better.  Use antibiotic ointment as directed by your health care provider. Follow the instructions carefully.  There are many different ways to close and cover an incision, including stitches (sutures), skin glue, and adhesive strips. Follow your health care  provider's instructions about:  Incision care.  Bandage (dressing) changes and removal.  Incision closure removal.  Keep the bandage (dressing) dry until your health care provider says that it can be removed. Take sponge baths only. Ask your health care provider when you can start showering or taking a bath.  After your dressing is off, check your incision every day for signs of infection. Watch for:  Redness, swelling, or pain.  Fluid, blood, or pus.  You can return to your normal activities. Do not do anything that stretches or puts pressure on your incision.  You can return to your normal diet.  Keep all follow-up visits as directed by your health care provider. This is important. SEEK MEDICAL CARE IF:  You have a fever.  Your incision bleeds.  You have redness, swelling, or pain in the incision area.  You have fluid, blood, or pus coming from your incision.  Your cyst comes back after surgery.   This information is not intended to replace advice given to you by your health care provider. Make sure you discuss any questions you have with your health care provider.   Document Released: 02/04/2014 Document Reviewed: 02/04/2014 Elsevier Interactive Patient Education [employer].

## 2015-04-20 NOTE — Progress Notes (Signed)
Subjective:    Patient ID: Donna [name], female    DOB: 1955-09-06, 60 y.o.   MRN: [id number]   Chief Complaint  Patient presents with  . Diabetes  . Cyst    ? neck and back    HPIDoing well.  Has not been great about following DM diet and not exercing but is staing on meds and tol metformin.  Plans to start walking [employer] caused a horrible yeast infxn so she does not want to try that class again and did not tol trulicty.  Has had a cyst on the back of her neck that rubs against shirts. Starting to tingle. Wants it removed as has been progressively enlarging. Drained once, she stuck a pin in it without success. Has another small subcutaneous notdule on her left flank which she just noticed about 4 mos ago.  No overlying skin changes, no tenderness, has not changed sig.  Past Medical History  Diagnosis Date  . Hypertension   . Hypothyroid   . Diverticulitis   . Arthritis   . Allergy   . Diabetes mellitus without complication ([place])   . History of bronchitis   . History of gout   . Skin cancer (melanoma) ([place])     to be removed 12/09/13  . GERD (gastroesophageal reflux disease)   . Difficulty sleeping   . Endometrial cancer [facility])    Past Surgical History  Procedure Laterality Date  . Cholecystectomy    . Thyroidectomy    . Robotic assisted total hysterectomy with bilateral salpingo oopherectomy Bilateral 11/23/2013    Procedure: ROBOTIC ASSISTED TOTAL HYSTERECTOMY WITH BILATERAL SALPINGO OOPHORECTOMY, LYMPH NODE DISSECTION ,LYSIS OF ADHESIONS,PELVIC WASHINGS ;  Surgeon: Everitt Amber, MD;  Location: [facility];  Service: Gynecology;  Laterality: Bilateral;  . Suture removal N/A 11/23/2013    Procedure: SUTURE REMOVAL;  Surgeon: Everitt Amber, MD;  Location: [facility];  Service: Gynecology;  Laterality: N/A;  . Skin cancer removal      melanoma removed from back    Current Outpatient Prescriptions on File Prior to Visit  Medication Sig Dispense Refill  . ACCU-CHEK AVIVA PLUS test  strip USE 1 STRIP DAILY FOR GLUCOSE TESTING 100 each 11  . ACCU-CHEK SOFTCLIX LANCETS lancets USE 1 DAILY FOR GLUCOSE TESTING 100 each 0  . albuterol (PROVENTIL HFA;VENTOLIN HFA) 108 (90 BASE) MCG/ACT inhaler Inhale 2 puffs into the lungs every 4 (four) hours as needed for wheezing or shortness of breath. 1 Inhaler 11  . cyclobenzaprine (FLEXERIL) 10 MG tablet Take 1 tablet (10 mg total) by mouth at bedtime. 30 tablet 3  . ergocalciferol (VITAMIN D2) 50000 units capsule Take 1 capsule (50,000 Units total) by mouth once a week. 12 capsule 3  . Fluocinolone Acetonide 0.01 % OIL Place 5 drops in ear(s) 2 (two) times daily as needed ("eczema in ears"). 20 mL 5  . ibuprofen (ADVIL,MOTRIN) 800 MG tablet TAKE 1 TABLET BY MOUTH 3 TIMES A DAY AS NEEDED 60 tablet 1  . ipratropium (ATROVENT) 0.06 % nasal spray Place 2 sprays into both nostrils 3 (three) times daily as needed for rhinitis. Reported on 03/23/2015    . loratadine (CLARITIN) 10 MG tablet Take 1 tablet (10 mg total) by mouth daily. 30 tablet 11  . losartan-hydrochlorothiazide (HYZAAR) 50-12.5 MG tablet Take 1 tablet by mouth every morning. 90 tablet 0  . metoprolol (LOPRESSOR) 50 MG tablet Take 1 tablet (50 mg total) by mouth 2 (two) times daily. 180 tablet 3  . montelukast (  SINGULAIR) 10 MG tablet Take 1 tablet (10 mg total) by mouth at bedtime. 90 tablet 3  . pantoprazole (PROTONIX) 40 MG tablet TAKE 1 TABLET IN THE EVENING 90 tablet 0  . potassium chloride (K-DUR,KLOR-CON) 10 MEQ tablet Take 1 tablet (10 mEq total) by mouth every evening. 90 tablet 1  . ranitidine (ZANTAC) 150 MG tablet Take 1 tablet (150 mg total) by mouth 2 (two) times daily. 180 tablet 0  . traMADol (ULTRAM) 50 MG tablet Take 1 tablet (50 mg total) by mouth every 8 (eight) hours as needed. 270 tablet 0   No current facility-administered medications on file prior to visit.   Allergies  Allergen Reactions  . Glimepiride Other (See Comments)    Per patient causes severe gas  pain  . Morphine And Related Nausea And Vomiting  . Colcrys [Colchicine] Rash  . Lisinopril Rash   Family History  Problem Relation Age of Onset  . Stroke Mother   . Heart disease Mother   . Cancer Father   . Diabetes Father   . Cancer Brother   . Diabetes Paternal Uncle    Social History   Social History  . Marital Status: Single    Spouse Name: N/A  . Number of Children: N/A  . Years of Education: N/A   Social History Main Topics  . Smoking status: Former Smoker    Quit date: 11/20/2007  . Smokeless tobacco: None  . Alcohol Use: No     Comment: occasional  . Drug Use: No  . Sexual Activity: No   Other Topics Concern  . None   Social History Narrative       Review of Systems  Constitutional: Positive for fatigue. Negative for fever, chills, diaphoresis and appetite change.  Eyes: Negative for visual disturbance.  Respiratory: Negative for cough and shortness of breath.   Cardiovascular: Negative for chest pain, palpitations and leg swelling.  Genitourinary: Negative for decreased urine volume.  Musculoskeletal: Positive for back pain, joint swelling and arthralgias. Negative for gait problem.  Neurological: Negative for syncope and headaches.  Hematological: Does not bruise/bleed easily.  Psychiatric/Behavioral: Negative for dysphoric mood. The patient is not nervous/anxious.        Objective:  BP 141/81 mmHg  Pulse 76  Temp(Src) 97.9 F (36.6 C)  Resp 18  Ht 5\' 6"  (1.676 m)  Wt 251 lb (113.853 kg)  BMI 40.53 kg/m2  Physical Exam  Constitutional: She is oriented to person, place, and time. She appears well-developed and well-nourished. No distress.  HENT:  Head: Normocephalic and atraumatic.  Right Ear: External ear normal.  Left Ear: External ear normal.  Eyes: Conjunctivae are normal. No scleral icterus.  Neck: Normal range of motion. Neck supple. No thyromegaly present.  Cardiovascular: Normal rate, regular rhythm, normal heart sounds and  intact distal pulses.   Pulmonary/Chest: Effort normal and breath sounds normal. No respiratory distress.  Musculoskeletal: She exhibits no edema.  Lymphadenopathy:    She has no cervical adenopathy.  Neurological: She is alert and oriented to person, place, and time.  Skin: Skin is warm and dry. No lesion and no rash noted. She is not diaphoretic. No erythema.  1.5 cm subcutaneous mass at central base of neck.  No warmth, tenderness, erythema. Well defined fluctuation, no induration.  Similar at lower left flank 1x 2cm approx  Psychiatric: She has a normal mood and affect. Her behavior is normal.      Results for orders placed or performed in visit on  04/20/15  Comprehensive metabolic panel  Result Value Ref Range   Sodium 138 135 - 146 mmol/L   Potassium 3.6 3.5 - 5.3 mmol/L   Chloride 100 98 - 110 mmol/L   CO2 27 20 - 31 mmol/L   Glucose, Bld 108 (H) 65 - 99 mg/dL   BUN 13 7 - 25 mg/dL   Creat 0.61 0.50 - 1.05 mg/dL   Total Bilirubin 0.4 0.2 - 1.2 mg/dL   Alkaline Phosphatase 66 33 - 130 U/L   AST 36 (H) 10 - 35 U/L   ALT 31 (H) 6 - 29 U/L   Total Protein 7.3 6.1 - 8.1 g/dL   Albumin 3.9 3.6 - 5.1 g/dL   Calcium 9.2 8.6 - 10.4 mg/dL  Thyroid Panel With TSH  Result Value Ref Range   T4, Total 9.3 4.5 - 12.0 ug/dL   T3 Uptake 26 22 - 35 %   Free Thyroxine Index 2.4 1.4 - 3.8   TSH 1.33 mIU/L  LDL Cholesterol, Direct  Result Value Ref Range   Direct LDL 101 <130 mg/dL  POCT glycosylated hemoglobin (Hb A1C)  Result Value Ref Range   Hemoglobin A1C 7.5     Assessment & Plan:   1. Uncontrolled type 2 diabetes mellitus with hyperglycemia, without long-term current use of insulin (HCC) - cont metformin and glipizide, intolerant of glp-1 ag and SGLT2 inh, pt agreeable to start trial of dpp4 - hopefully this and renewed diet/exercise efforts will allow her to come in glipizide eventually  2. Postoperative hypothyroidism - no longer needing supplementation as had some  regeneration  3. Fatty infiltration of liver   4. Vitamin D deficiency   5. Essential hypertension, benign   6. Sebaceous cyst - removed by Mancel Bale today from posterior low neck.    Orders Placed This Encounter  Procedures  . Comprehensive metabolic panel    Order Specific Question:  Has the patient fasted?    Answer:  Yes  . Microalbumin/Creatinine Ratio, Urine  . Thyroid Panel With TSH  . LDL Cholesterol, Direct  . POCT glycosylated hemoglobin (Hb A1C)    Meds ordered this encounter  Medications  . linagliptin (TRADJENTA) 5 MG TABS tablet    Sig: Take 1 tablet (5 mg total) by mouth daily.    Dispense:  90 tablet    Refill:  3  . metformin (FORTAMET) 1000 MG (OSM) 24 hr tablet    Sig: Take 2 tablets (2,000 mg total) by mouth at bedtime.    Dispense:  180 tablet    Refill:  3  . glipiZIDE (GLUCOTROL) 5 MG tablet    Sig: Take 1 tablet (5 mg total) by mouth daily before breakfast.    Dispense:  90 tablet    Refill:  0    I personally performed the services described in this documentation, which was scribed in my presence. The recorded information has been reviewed and considered, and addended by me as needed.  [name], MD MPH

## 2015-04-21 LAB — LIPID PANEL
CHOL/HDL RATIO: 4.7 ratio (ref <=5.0)
CHOLESTEROL: 156 mg/dL (ref 125–200)
HDL: 33 mg/dL — ABNORMAL LOW (ref >=46)
LDL Cholesterol: 84 mg/dL (ref <130)
Triglycerides: 195 mg/dL — ABNORMAL HIGH (ref <150)
VLDL: 39 mg/dL — AB (ref <30)

## 2015-04-21 LAB — COMPREHENSIVE METABOLIC PANEL WITH GFR
ALT: 31 U/L — ABNORMAL HIGH (ref 6–29)
AST: 36 U/L — ABNORMAL HIGH (ref 10–35)
Albumin: 3.9 g/dL (ref 3.6–5.1)
Alkaline Phosphatase: 66 U/L (ref 33–130)
BUN: 13 mg/dL (ref 7–25)
CO2: 27 mmol/L (ref 20–31)
Calcium: 9.2 mg/dL (ref 8.6–10.4)
Chloride: 100 mmol/L (ref 98–110)
Creat: 0.61 mg/dL (ref 0.50–1.05)
Glucose, Bld: 108 mg/dL — ABNORMAL HIGH (ref 65–99)
Potassium: 3.6 mmol/L (ref 3.5–5.3)
Sodium: 138 mmol/L (ref 135–146)
Total Bilirubin: 0.4 mg/dL (ref 0.2–1.2)
Total Protein: 7.3 g/dL (ref 6.1–8.1)

## 2015-04-21 LAB — THYROID PANEL WITH TSH
Free Thyroxine Index: 2.4 (ref 1.4–3.8)
T3 Uptake: 26 % (ref 22–35)
T4 TOTAL: 9.3 ug/dL (ref 4.5–12.0)
TSH: 1.33 m[IU]/L

## 2015-04-21 LAB — MICROALBUMIN / CREATININE URINE RATIO
CREATININE, URINE: 122 mg/dL (ref 20–320)
MICROALB UR: 2.1 mg/dL
MICROALB/CREAT RATIO: 17 ug/mg{creat} (ref <30)

## 2015-04-27 ENCOUNTER — Ambulatory Visit (INDEPENDENT_AMBULATORY_CARE_PROVIDER_SITE_OTHER): Payer: Managed Care, Other (non HMO) | Admitting: Physician Assistant

## 2015-04-27 DIAGNOSIS — L723 Sebaceous cyst: Secondary | ICD-10-CM

## 2015-04-27 DIAGNOSIS — Z4802 Encounter for removal of sutures: Secondary | ICD-10-CM

## 2015-04-27 NOTE — Progress Notes (Signed)
Subjective:     Patient ID: Donna [name], female   DOB: 1955/02/26, 60 y.o.   MRN: [id number]  HPI The patient is a 60 year old women who presents today for a suture removal. She presented on 3/23 for sebaceous cyst removal with Dr.[name] and today is 7 day f/u. She has been doing well, no complaints in regards to the cyst removal. Mild itching due to feeling the sutures other than that no complaints.   Review of Systems Pertinent ROS as above in HPI    Objective:   Physical Exam  Constitutional: She appears well-developed and well-nourished.  Obese female.  HENT:  Head: Normocephalic and atraumatic.  Cardiovascular: Normal rate and regular rhythm.   Pulmonary/Chest: Effort normal and breath sounds normal.   Procedure Note: 4 SI sutures removed from skin, central base of neck at midline. Some eschar towards lateral left side of wound. Skin approximated and healing well.      Assessment and Plan    60 year old presents for f/u of sebaceous cyst and suture removal. Area healing well, no restraints at this time. Continue to let area of eschar heal to decrease risk for infection.

## 2015-04-27 NOTE — Patient Instructions (Signed)
     IF you received an x-ray today, you will receive an invoice from [facility]. Please contact [facility] at [phone] with questions or concerns regarding your invoice.   IF you received labwork today, you will receive an invoice from [facility]. Please contact [facility] at [phone] with questions or concerns regarding your invoice.   Our billing staff will not be able to assist you with questions regarding bills from these companies.  You will be contacted with the lab results as soon as they are available. The fastest way to get your results is to activate your My Chart account. Instructions are located on the last page of this paperwork. If you have not heard from us regarding the results in 2 weeks, please contact this office.      

## 2015-04-28 NOTE — Progress Notes (Signed)
Donna [name]  MRN: [id number] DOB: Mar 29, 1955  Subjective:  Pt presents to clinic for suture removal from a sebaceous cyst that she had removed last week.  She has had no problems with the area.  Patient Active Problem List   Diagnosis Date Noted  . Allergic rhinitis due to pollen 01/10/2015  . Type II diabetes mellitus, uncontrolled ([place]) 03/06/2014  . Multinodular goiter 03/06/2014  . Fatty infiltration of liver 01/26/2014  . Hx of total thyroidectomy with radical neck dissection 11/12/2013  . Diverticulitis large intestine 10/14/2013  . Endometrial cancer, grade I ([place]) 10/14/2013  . Other and unspecified ovarian cyst 09/29/2013  . Postmenopause 09/29/2013  . Essential hypertension, benign 09/03/2013  . Postoperative hypothyroidism 09/03/2013  . Vitamin D deficiency 09/03/2013    Current Outpatient Prescriptions on File Prior to Visit  Medication Sig Dispense Refill  . ACCU-CHEK AVIVA PLUS test strip USE 1 STRIP DAILY FOR GLUCOSE TESTING 100 each 11  . ACCU-CHEK SOFTCLIX LANCETS lancets USE 1 DAILY FOR GLUCOSE TESTING 100 each 0  . albuterol (PROVENTIL HFA;VENTOLIN HFA) 108 (90 BASE) MCG/ACT inhaler Inhale 2 puffs into the lungs every 4 (four) hours as needed for wheezing or shortness of breath. 1 Inhaler 11  . cyclobenzaprine (FLEXERIL) 10 MG tablet Take 1 tablet (10 mg total) by mouth at bedtime. 30 tablet 3  . ergocalciferol (VITAMIN D2) 50000 units capsule Take 1 capsule (50,000 Units total) by mouth once a week. 12 capsule 3  . Fluocinolone Acetonide 0.01 % OIL Place 5 drops in ear(s) 2 (two) times daily as needed ("eczema in ears"). 20 mL 5  . glipiZIDE (GLUCOTROL) 5 MG tablet Take 1 tablet (5 mg total) by mouth daily before breakfast. 90 tablet 0  . ibuprofen (ADVIL,MOTRIN) 800 MG tablet TAKE 1 TABLET BY MOUTH 3 TIMES A DAY AS NEEDED 60 tablet 1  . ipratropium (ATROVENT) 0.06 % nasal spray Place 2 sprays into both nostrils 3 (three) times daily as needed for rhinitis.  Reported on 03/23/2015    . linagliptin (TRADJENTA) 5 MG TABS tablet Take 1 tablet (5 mg total) by mouth daily. 90 tablet 3  . loratadine (CLARITIN) 10 MG tablet Take 1 tablet (10 mg total) by mouth daily. 30 tablet 11  . losartan-hydrochlorothiazide (HYZAAR) 50-12.5 MG tablet Take 1 tablet by mouth every morning. 90 tablet 0  . metformin (FORTAMET) 1000 MG (OSM) 24 hr tablet Take 2 tablets (2,000 mg total) by mouth at bedtime. 180 tablet 3  . metoprolol (LOPRESSOR) 50 MG tablet Take 1 tablet (50 mg total) by mouth 2 (two) times daily. 180 tablet 3  . montelukast (SINGULAIR) 10 MG tablet Take 1 tablet (10 mg total) by mouth at bedtime. 90 tablet 3  . pantoprazole (PROTONIX) 40 MG tablet TAKE 1 TABLET IN THE EVENING 90 tablet 0  . potassium chloride (K-DUR,KLOR-CON) 10 MEQ tablet Take 1 tablet (10 mEq total) by mouth every evening. 90 tablet 1  . ranitidine (ZANTAC) 150 MG tablet Take 1 tablet (150 mg total) by mouth 2 (two) times daily. 180 tablet 0  . traMADol (ULTRAM) 50 MG tablet Take 1 tablet (50 mg total) by mouth every 8 (eight) hours as needed. 270 tablet 0   No current facility-administered medications on file prior to visit.    Allergies  Allergen Reactions  . Glimepiride Other (See Comments)    Per patient causes severe gas pain  . Morphine And Related Nausea And Vomiting  . Colcrys [Colchicine] Rash  . Lisinopril  Rash    Review of Systems  Constitutional: Negative for chills and fatigue.   Objective:  There were no vitals taken for this visit.  Physical Exam  Constitutional: She is oriented to person, place, and time and well-developed, well-nourished, and in no distress.  HENT:  Head: Normocephalic and atraumatic.  Right Ear: Hearing and external ear normal.  Left Ear: Hearing and external ear normal.  Eyes: Conjunctivae are normal.  Neck: Normal range of motion.  Pulmonary/Chest: Effort normal.  Neurological: She is alert and oriented to person, place, and time.  Gait normal.  Skin: Skin is warm and dry.  Well healed wound on the posterior neck without any surrounding erythema  Psychiatric: Mood, memory, affect and judgment normal.  Vitals reviewed.  Stitches removed without problems Assessment and Plan :  Visit for suture removal  Sebaceous cyst   Wound care d/w pt  Windell Hummingbird PA-C  [facility] 04/28/2015 12:06 PM

## 2015-04-29 ENCOUNTER — Other Ambulatory Visit: Payer: Self-pay | Admitting: Family Medicine

## 2015-05-02 ENCOUNTER — Other Ambulatory Visit: Payer: Self-pay

## 2015-05-02 NOTE — Telephone Encounter (Signed)
Mail order pharm sent req to RF tramadol. Pended.

## 2015-05-09 ENCOUNTER — Telehealth: Payer: Self-pay

## 2015-05-09 MED ORDER — TRAMADOL HCL 50 MG PO TABS: 50.0000 mg | ORAL_TABLET | Freq: Three times a day (TID) | ORAL | Status: DC | PRN

## 2015-05-09 MED ORDER — IBUPROFEN 800 MG PO TABS: 800.0000 mg | ORAL_TABLET | Freq: Three times a day (TID) | ORAL | Status: DC | PRN

## 2015-05-09 NOTE — Telephone Encounter (Signed)
rx printed to be faxed to mail order pham

## 2015-05-09 NOTE — Telephone Encounter (Signed)
printed

## 2015-05-09 NOTE — Telephone Encounter (Signed)
Faxed tramadol and notified pt on VM.

## 2015-05-09 NOTE — Telephone Encounter (Signed)
Pt needs a refill on her tramadol and ibuprofen.  Says that she has been trying to get the tramadol since the end of March.  She says the mail order pharmacy says we are not responding.  I see a note in there from 05-02-15 about it pending.  Please call patient at [phone]

## 2015-05-09 NOTE — Telephone Encounter (Signed)
Dr Brigitte Pulse, please see Rx RF req from 4/4. I refilled her ibuprofen, this is first req for this.

## 2015-05-17 ENCOUNTER — Other Ambulatory Visit: Payer: Self-pay

## 2015-05-17 MED ORDER — PANTOPRAZOLE SODIUM 40 MG PO TBEC: 40.0000 mg | DELAYED_RELEASE_TABLET | Freq: Every evening | ORAL | Status: DC

## 2015-05-17 NOTE — Telephone Encounter (Signed)
Dr Brigitte Pulse, you just saw pt for check up but don't see GERD addressed. OK to RF?

## 2015-07-30 LAB — HM DIABETES EYE EXAM

## 2015-08-09 IMAGING — CT CT CHEST W/ CM
2 of 3 series · 15 of 30 positions shown, 17 images · IV contrast (75CC OMNI 300)
Comparison: Right clavicle series of September 03, 2013.

CLINICAL DATA: Palpable lump anterior to the right clavicle;
history of previous thyroidectomy ; history of tobacco use which was
discontinued 2-3 years ago

EXAM:
CT CHEST WITH CONTRAST
TECHNIQUE: Multidetector CT imaging of the chest was performed during
intravenous contrast administration.
CONTRAST:  75 cc of Omnipaque 300 intravenously.

[Series 102: chest w/ 5mm · axial · 0.98mm/px · z∈[-276,-36]mm · 7 of 66 slices shown, 9 images]
[im 9/66  mediastinal]
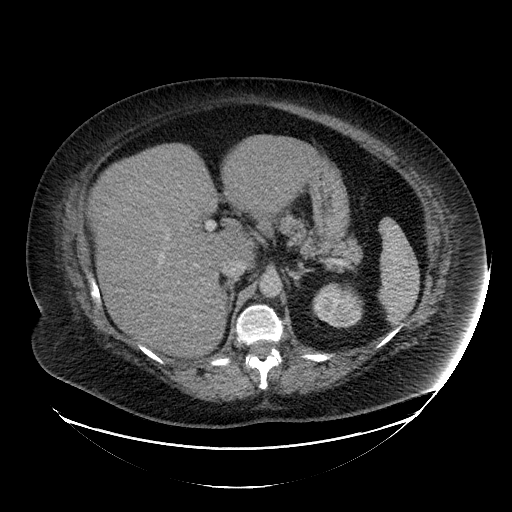
[im 9/66  lung]
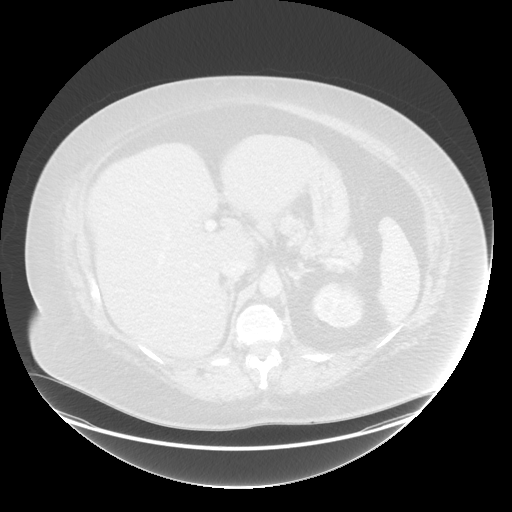
[im 17/66  lung]
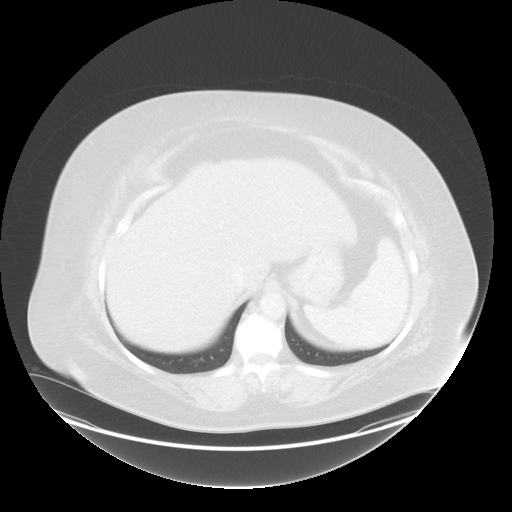
[im 25/66  lung]
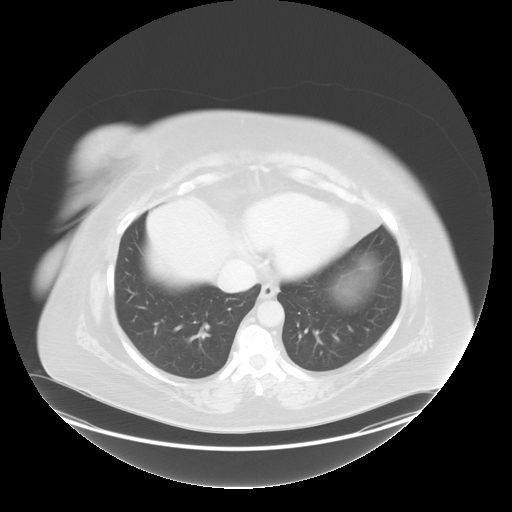
[im 33/66  lung]
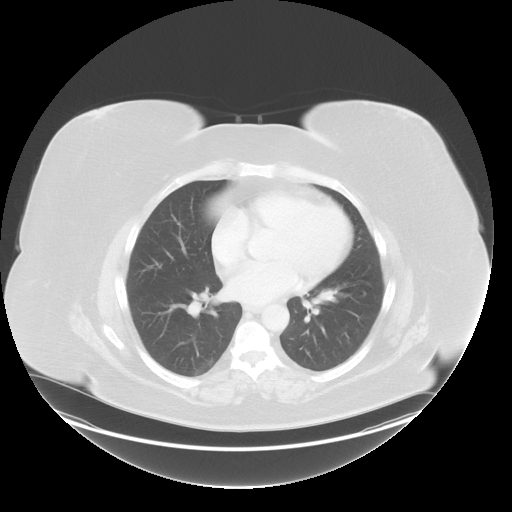
[im 41/66  mediastinal]
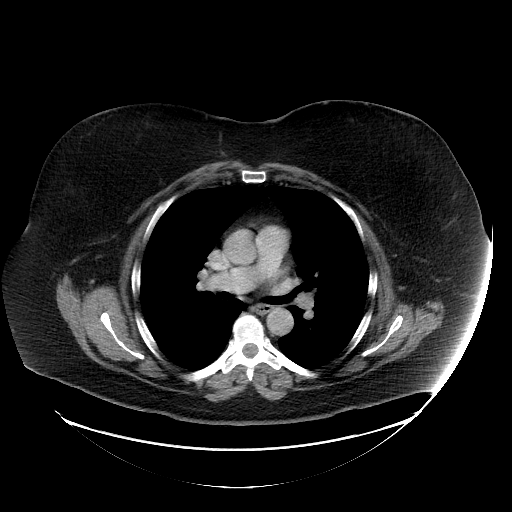
[im 41/66  lung]
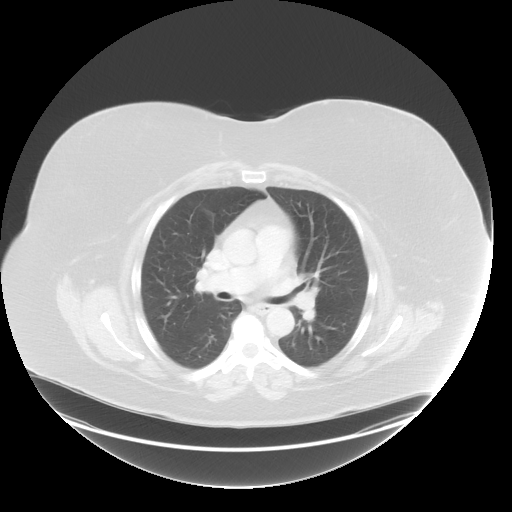
[im 49/66  lung]
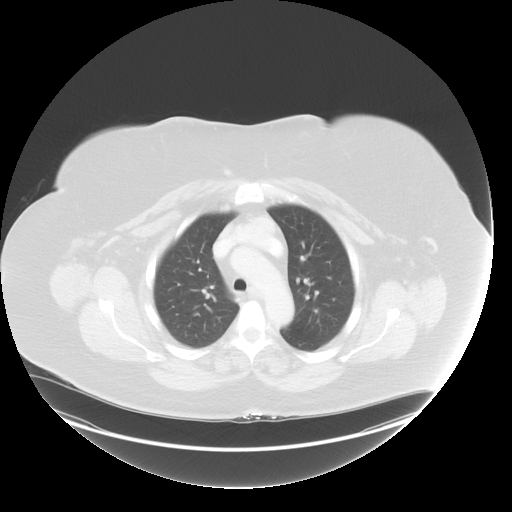
[im 57/66  lung]
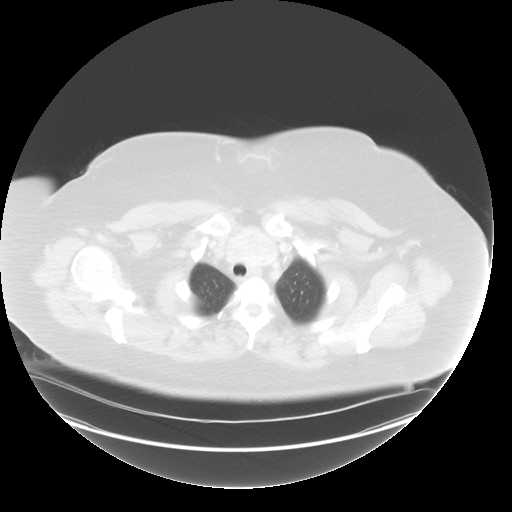

[Series 602: sagittal body · sagittal · 0.70mm/px · 8 of 145 slices shown]
[im 17/145  mediastinal]
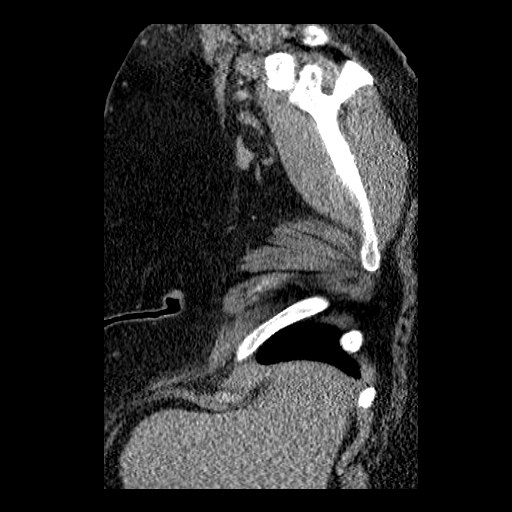
[im 33/145  mediastinal]
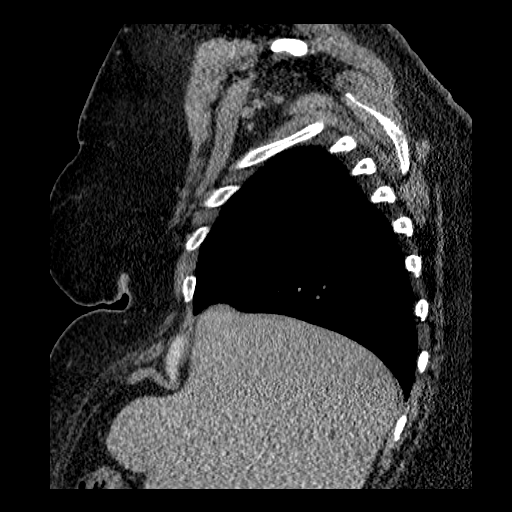
[im 49/145  mediastinal]
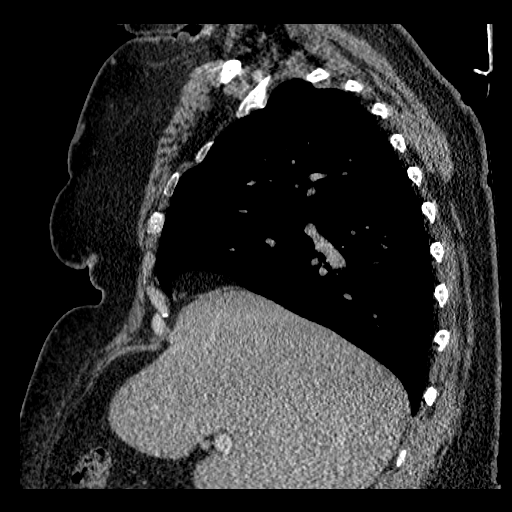
[im 65/145  mediastinal]
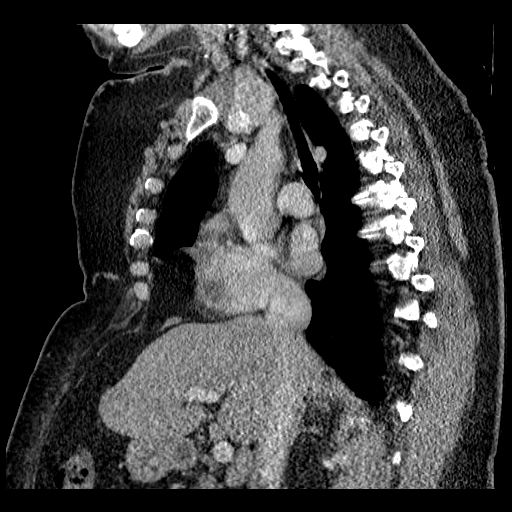
[im 81/145  mediastinal]
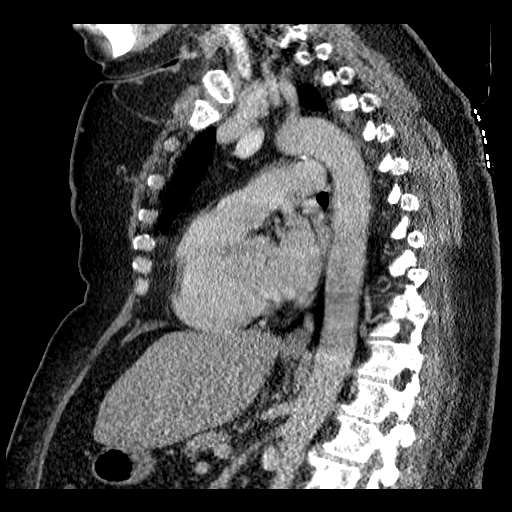
[im 97/145  mediastinal]
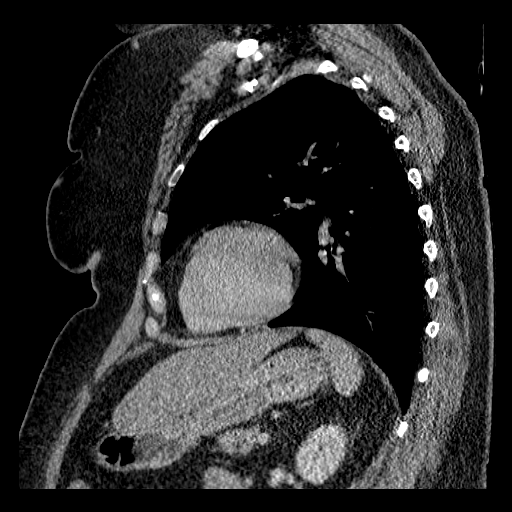
[im 113/145  mediastinal]
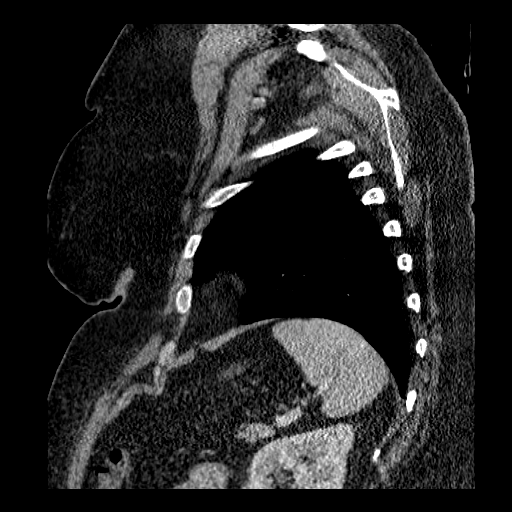
[im 129/145  mediastinal]
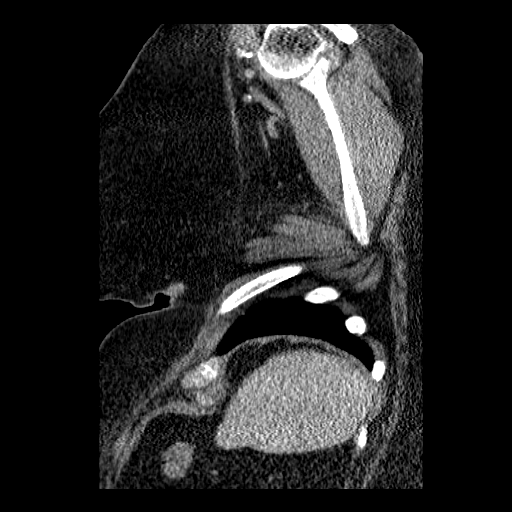

[15 of 30 positions shown; findings below may reference images not displayed]

FINDINGS: The subcutaneous deeper soft tissues of the right upper lateral
pectoral region exhibit normal density. No discrete mass is
demonstrated. No abnormality of the underlying bony structures is
demonstrated. The pectoral musculature exhibits normal density.
There is a right axillary lymph node measuring 14 x 10 mm.

There is heterogeneous soft tissue density in the thyroid bed
extending inferiorly into the thorax. It measures 5.3 cm
transversely by 3.9 cm AP by 5.8 cm longitudinally. There is no
mediastinal or hilar lymphadenopathy. The cardiac chambers are
normal in size. The thoracic aorta is unremarkable. There is no
pleural nor pericardial effusion. The lungs are well-expanded and
clear.

Within the upper abdomen the observed portions of the liver and
spleen are normal. The observed portions of the bony thorax are
normal.
IMPRESSION: 1. Deep to the area marked in the upper lateral right pectoral
region no abnormal mass is demonstrated. The clavicle is normal in
appearance. This region could be evaluated further with MRI if the
clinical findings persist.
2. There is abnormal soft tissue in the thyroid bed which may
reflect regrowth of tissue which now extends into the thorax.
Thyroid ultrasound is recommended.
3. There is no acute cardiopulmonary abnormality.

## 2015-08-23 ENCOUNTER — Encounter: Payer: Self-pay | Admitting: Family Medicine

## 2015-08-23 DIAGNOSIS — M545 Low back pain, unspecified: Secondary | ICD-10-CM | POA: Insufficient documentation

## 2015-08-23 DIAGNOSIS — G8929 Other chronic pain: Secondary | ICD-10-CM | POA: Insufficient documentation

## 2015-08-23 NOTE — Progress Notes (Addendum)
Subjective:    Patient ID: Donna [name], female    DOB: 04/04/1955, 60 y.o.   MRN: [id number] Chief Complaint  Patient presents with  . Annual Exam    HPI  Donna [name] in a 60 yo woman here for her complete physical.  Her last CPE was 08/2013 by myself when we found her grade 1 endometrial carcinoma.  Primary Preventative Screening: Cervical ca screen:   No h/o abnml pap with last on 08/2013 with neg HR HPV but did have grade 1 endometrial cancer found at that time so underwent a hysterectomy 10/2013 Mam: 02/2013 when she had to have a biopsy of her right breast CRS: Immunizations: has flu shot annually at her job. Had TDaP and pneumovax 08/2013 EKG: done 10/2013 nml STI screening done 08/2013 with neg Hep C and HIV  Diet: Exercise: Optho: 05/2014 by Dr. Laurence Aly at [facility] Dentist: Vit/Supp/OTCs: h/o vit D def on once a week high dose replacement. Last vit D 11/2015 27 <- 20 <- 23 <- 22  sees derm prn  DM: foot exam 08/12/2014. hgba1c was 6.8 -> 7.6 -> 7.5 4 mos prior with LDL at goal at 101. Urine microalb was normal 03/2015.  Pts LDL is at goal but her HDL is very low with her trig mod elevated. Prev saw Dr. Dwyane Dee, endocrinology for [facility]ng[facility] but transferred her care back here since her Dm had been relatively stable/well-controlled. On glipizide 5mg  qam, metformin 2000mg  ER qd, and tradjenta 5mg  qd which she has been taking but cost $100 a pop.  Could not tolerate invokana. No longer on insulin.  Not taking asa. Has been able to check her sugars - work has been crazy, her hot water broke and flooded her floor.  No lows.  Just saw optho at [facility].  They are concerned that she may be developing glaucoma. HTN: On losartan-hctz 50-12.5 and metoprolol 50 bid with K 10 qd. Hypothyroid: post-op total thyroidectomy but some tissue regenerated as she was able to go off of levothyroxine 2 yrs prior. Lumbago: On bid tramadol chronically  Has been having 1-2 minutes of palpitations  when she gets overheated, it havpeened once at night. Resolves when she sits and drinks.  No presyncope or chest pain/pressure with this When she was in the hosp for 5 d for diverticulitis. she did have some sort of arythmai and syncope from all the stress.   Past Medical History:  Diagnosis Date  . Allergy   . Arthritis   . Diabetes mellitus without complication ([place])   . Difficulty sleeping   . Diverticulitis   . Endometrial cancer ([place])   . GERD (gastroesophageal reflux disease)   . History of bronchitis   . History of gout   . Hypertension   . Hypothyroid   . Skin cancer (melanoma) ([place])    to be removed 12/09/13   Past Surgical History:  Procedure Laterality Date  . CHOLECYSTECTOMY    . ROBOTIC ASSISTED TOTAL HYSTERECTOMY WITH BILATERAL SALPINGO OOPHERECTOMY Bilateral 11/23/2013   Procedure: ROBOTIC ASSISTED TOTAL HYSTERECTOMY WITH BILATERAL SALPINGO OOPHORECTOMY, LYMPH NODE DISSECTION ,LYSIS OF ADHESIONS,PELVIC WASHINGS ;  Surgeon: Everitt Amber, MD;  Location: [facility];  Service: Gynecology;  Laterality: Bilateral;  . skin cancer removal     melanoma removed from back   . SUTURE REMOVAL N/A 11/23/2013   Procedure: SUTURE REMOVAL;  Surgeon: Everitt Amber, MD;  Location: [facility];  Service: Gynecology;  Laterality: N/A;  . THYROIDECTOMY  Current Outpatient Prescriptions on File Prior to Visit  Medication Sig Dispense Refill  . ACCU-CHEK AVIVA PLUS test strip USE 1 STRIP DAILY FOR GLUCOSE TESTING 100 each 11  . ACCU-CHEK SOFTCLIX LANCETS lancets USE 1 DAILY FOR GLUCOSE TESTING 100 each 0  . albuterol (PROVENTIL HFA;VENTOLIN HFA) 108 (90 BASE) MCG/ACT inhaler Inhale 2 puffs into the lungs every 4 (four) hours as needed for wheezing or shortness of breath. 1 Inhaler 11  . cyclobenzaprine (FLEXERIL) 10 MG tablet Take 1 tablet (10 mg total) by mouth at bedtime. 30 tablet 3  . ergocalciferol (VITAMIN D2) 50000 units capsule Take 1 capsule (50,000 Units total) by mouth once a week. 12  capsule 3  . Fluocinolone Acetonide 0.01 % OIL Place 5 drops in ear(s) 2 (two) times daily as needed ("eczema in ears"). 20 mL 5  . glipiZIDE (GLUCOTROL) 5 MG tablet Take 1 tablet (5 mg total) by mouth daily before breakfast. 90 tablet 0  . ibuprofen (ADVIL,MOTRIN) 800 MG tablet Take 1 tablet (800 mg total) by mouth 3 (three) times daily as needed. 270 tablet 1  . ipratropium (ATROVENT) 0.06 % nasal spray Place 2 sprays into both nostrils 3 (three) times daily as needed for rhinitis. Reported on 03/23/2015    . loratadine (CLARITIN) 10 MG tablet Take 1 tablet (10 mg total) by mouth daily. 30 tablet 11  . losartan-hydrochlorothiazide (HYZAAR) 50-12.5 MG tablet TAKE 1 TABLET EVERY MORNING 90 tablet 1  . metformin (FORTAMET) 1000 MG (OSM) 24 hr tablet Take 2 tablets (2,000 mg total) by mouth at bedtime. 180 tablet 3  . metoprolol (LOPRESSOR) 50 MG tablet Take 1 tablet (50 mg total) by mouth 2 (two) times daily. 180 tablet 3  . montelukast (SINGULAIR) 10 MG tablet Take 1 tablet (10 mg total) by mouth at bedtime. 90 tablet 3  . pantoprazole (PROTONIX) 40 MG tablet Take 1 tablet (40 mg total) by mouth every evening. 90 tablet 3  . potassium chloride (K-DUR,KLOR-CON) 10 MEQ tablet Take 1 tablet (10 mEq total) by mouth every evening. 90 tablet 1  . ranitidine (ZANTAC) 150 MG tablet TAKE 1 TABLET TWICE A DAY 180 tablet 1  . traMADol (ULTRAM) 50 MG tablet Take 1 tablet (50 mg total) by mouth every 8 (eight) hours as needed. 270 tablet 1   No current facility-administered medications on file prior to visit.    Allergies  Allergen Reactions  . Glimepiride Other (See Comments)    Per patient causes severe gas pain  . Morphine And Related Nausea And Vomiting  . Colcrys [Colchicine] Rash  . Lisinopril Rash   Family History  Problem Relation Age of Onset  . Stroke Mother   . Heart disease Mother   . Cancer Father   . Diabetes Father   . Cancer Brother   . Diabetes Paternal Uncle    Social History    Social History  . Marital status: Single    Spouse name: N/A  . Number of children: N/A  . Years of education: N/A   Social History Main Topics  . Smoking status: Former Smoker    Quit date: 11/20/2007  . Smokeless tobacco: Never Used  . Alcohol use No     Comment: occasional  . Drug use: No  . Sexual activity: No   Other Topics Concern  . None   Social History Narrative  . None    Review of Systems  All other systems reviewed and are negative.  Objective:   Physical Exam  Constitutional: She is oriented to person, place, and time. She appears well-developed and well-nourished. No distress.  HENT:  Head: Normocephalic and atraumatic.  Right Ear: Tympanic membrane, external ear and ear canal normal.  Left Ear: Tympanic membrane, external ear and ear canal normal.  Nose: Nose normal. No mucosal edema or rhinorrhea.  Mouth/Throat: Uvula is midline, oropharynx is clear and moist and mucous membranes are normal. No posterior oropharyngeal erythema.  Eyes: Conjunctivae and EOM are normal. Pupils are equal, round, and reactive to light. Right eye exhibits no discharge. Left eye exhibits no discharge. No scleral icterus.  Neck: Normal range of motion. Neck supple. No thyromegaly present.  Cardiovascular: Normal rate, regular rhythm, normal heart sounds and intact distal pulses.   1+ pedal edema B  Pulmonary/Chest: Effort normal and breath sounds normal. No respiratory distress.  Abdominal: Soft. Bowel sounds are normal. There is no tenderness.  Genitourinary: No breast swelling, tenderness, discharge or bleeding.  Musculoskeletal: She exhibits no edema.  Lymphadenopathy:    She has no cervical adenopathy.    She has no axillary adenopathy.       Right: No supraclavicular adenopathy present.       Left: No supraclavicular adenopathy present.  Neurological: She is alert and oriented to person, place, and time. She has normal reflexes.  Reflex Scores:      Patellar  reflexes are 2+ on the right side and 2+ on the left side.      Achilles reflexes are 2+ on the right side and 2+ on the left side. Skin: Skin is warm and dry. She is not diaphoretic. No erythema.  Psychiatric: She has a normal mood and affect. Her behavior is normal.    Diabetic Foot Exam - Simple   Simple Foot Form Diabetic Foot exam was performed with the following findings:  Yes 08/24/2015 10:47 AM  Visual Inspection No deformities, no ulcerations, no other skin breakdown bilaterally:  Yes Sensation Testing Intact to touch and monofilament testing bilaterally:  Yes Pulse Check Posterior Tibialis and Dorsalis pulse intact bilaterally:  Yes Comments     BP 140/82   Pulse 77   Temp 97.6 F (36.4 C) (Oral)   Resp 18   Ht 5\' 6"  (1.676 m)   Wt 254 lb (115.2 kg)   SpO2 95%   BMI 41.00 kg/m      Assessment & Plan:  Pt having trouble with her insurance so has been distressing.   1. Annual physical exam   2. Screening for breast cancer - Needs mammogram - last was 2.5 yrs ago when she had a biopsy done - was ordered  3. Screening for cardiovascular, respiratory, and genitourinary diseases   4. Screening for colorectal cancer - Needs crs - has not had a colonoscopy but doesn't have anyone to drive her.  But agrees to do cologuard  5. Screening for deficiency anemia   6. Vitamin D deficiency - on once weekly high dose replacement and still to low so cont that and start vit D 2000u otc qd  7. Uncontrolled type 2 diabetes mellitus with hyperglycemia, without long-term current use of insulin (HCC) -  hgba1c was 6.8 -> 7.6 -> 7.5->7.4 today nml foot exam today, optho exam done this mo, Start on a baby asa qd. Tolerating tradjenta but can't afford ($100/mo) so will try to switch to Januvia as has coupon for $25/mo.  If her ins will not approve Januvia, may want to try switching to  Trulicity instead.  Lipid panel is at goal with LDL of 83 and non-HDL of 122, pt has never been tried on  statin. Does have metabolic syndrome with low HDL and high trig so rec starting omega-3 supp.  8. Postoperative hypothyroidism   9. Essential hypertension, benign   10. Fatty infiltration of liver - mild transaminitis is stable  11. Chronic bilateral low back pain without sciatica - tramadol 50 bid - due for refill in Oct   Ok to refill all meds x 6 mos prn.  Orders Placed This Encounter  Procedures  . MM Digital Screening    Standing Status:   Future    Standing Expiration Date:   10/23/2016    Order Specific Question:   Reason for Exam (SYMPTOM  OR DIAGNOSIS REQUIRED)    Answer:   screening, h/o right breast biopsy    Order Specific Question:   Is the patient pregnant?    Answer:   No    Order Specific Question:   Preferred imaging location?    Answer:   [facility]  . CBC  . VITAMIN D 25 Hydroxy (Vit-D Deficiency, Fractures)  . Comprehensive metabolic panel    Order Specific Question:   Has the patient fasted?    Answer:   Yes  . Lipid panel    Order Specific Question:   Has the patient fasted?    Answer:   Yes  . TSH  . Cologuard  . POCT urinalysis dipstick  . POCT glycosylated hemoglobin (Hb A1C)  . HM DIABETES FOOT EXAM    Meds ordered this encounter  Medications  . sitaGLIPtin (JANUVIA) 100 MG tablet    Sig: Take 1 tablet (100 mg total) by mouth daily.    Dispense:  90 tablet    Refill:  1     [name], M.D.  [facility] [address] [place], [place] [address] [phone] phone [phone] fax  08/27/15 4:40 PM  Results for orders placed or performed in visit on 08/24/15  CBC  Result Value Ref Range   WBC 9.8 3.8 - 10.8 K/uL   RBC 4.99 3.80 - 5.10 MIL/uL   Hemoglobin 14.3 11.7 - 15.5 g/dL   HCT 42.4 35.0 - 45.0 %   MCV 85.0 80.0 - 100.0 fL   MCH 28.7 27.0 - 33.0 pg   MCHC 33.7 32.0 - 36.0 g/dL   RDW 13.4 11.0 - 15.0 %   Platelets 399 140 - 400 K/uL   MPV 9.3 7.5 - 12.5 fL  VITAMIN D 25 Hydroxy (Vit-D  Deficiency, Fractures)  Result Value Ref Range   Vit D, 25-Hydroxy 27 (L) 30 - 100 ng/mL  Comprehensive metabolic panel  Result Value Ref Range   Sodium 137 135 - 146 mmol/L   Potassium 4.0 3.5 - 5.3 mmol/L   Chloride 101 98 - 110 mmol/L   CO2 25 20 - 31 mmol/L   Glucose, Bld 174 (H) 65 - 99 mg/dL   BUN 16 7 - 25 mg/dL   Creat 0.65 0.50 - 1.05 mg/dL   Total Bilirubin 0.5 0.2 - 1.2 mg/dL   Alkaline Phosphatase 75 33 - 130 U/L   AST 53 (H) 10 - 35 U/L   ALT 41 (H) 6 - 29 U/L   Total Protein 7.5 6.1 - 8.1 g/dL   Albumin 4.3 3.6 - 5.1 g/dL   Calcium 9.4 8.6 - 10.4 mg/dL  Lipid panel  Result Value Ref  Range   Cholesterol 158 125 - 200 mg/dL   Triglycerides 196 (H) <150 mg/dL   HDL 36 (L) >=46 mg/dL   Total CHOL/HDL Ratio 4.4 <=5.0 Ratio   VLDL 39 (H) <30 mg/dL   LDL Cholesterol 83 <130 mg/dL  TSH  Result Value Ref Range   TSH 1.05 mIU/L  POCT urinalysis dipstick  Result Value Ref Range   Color, UA yellow yellow   Clarity, UA clear clear   Glucose, UA =100 (A) negative   Bilirubin, UA negative negative   Ketones, POC UA negative negative   Spec Grav, UA 1.025    Blood, UA trace-intact (A) negative   pH, UA 5.0    Protein Ur, POC trace (A) negative   Urobilinogen, UA 0.2    Nitrite, UA Negative Negative   Leukocytes, UA Negative Negative  POCT glycosylated hemoglobin (Hb A1C)  Result Value Ref Range   Hemoglobin A1C 7.4

## 2015-08-24 ENCOUNTER — Encounter: Payer: Self-pay | Admitting: Family Medicine

## 2015-08-24 ENCOUNTER — Ambulatory Visit (INDEPENDENT_AMBULATORY_CARE_PROVIDER_SITE_OTHER): Payer: Managed Care, Other (non HMO) | Admitting: Family Medicine

## 2015-08-24 VITALS — BP 140/82 | HR 77 | Temp 97.6°F | Resp 18 | Ht 66.0 in | Wt 254.0 lb

## 2015-08-24 DIAGNOSIS — I10 Essential (primary) hypertension: Secondary | ICD-10-CM

## 2015-08-24 DIAGNOSIS — G8929 Other chronic pain: Secondary | ICD-10-CM

## 2015-08-24 DIAGNOSIS — Z1211 Encounter for screening for malignant neoplasm of colon: Secondary | ICD-10-CM

## 2015-08-24 DIAGNOSIS — M545 Low back pain: Secondary | ICD-10-CM

## 2015-08-24 DIAGNOSIS — Z130 Encounter for screening for diseases of the blood and blood-forming organs and certain disorders involving the immune mechanism: Secondary | ICD-10-CM | POA: Diagnosis not present

## 2015-08-24 DIAGNOSIS — E1165 Type 2 diabetes mellitus with hyperglycemia: Secondary | ICD-10-CM | POA: Diagnosis not present

## 2015-08-24 DIAGNOSIS — E559 Vitamin D deficiency, unspecified: Secondary | ICD-10-CM

## 2015-08-24 DIAGNOSIS — Z0000 Encounter for general adult medical examination without abnormal findings: Secondary | ICD-10-CM

## 2015-08-24 DIAGNOSIS — E890 Postprocedural hypothyroidism: Secondary | ICD-10-CM | POA: Diagnosis not present

## 2015-08-24 DIAGNOSIS — Z1389 Encounter for screening for other disorder: Secondary | ICD-10-CM

## 2015-08-24 DIAGNOSIS — Z1239 Encounter for other screening for malignant neoplasm of breast: Secondary | ICD-10-CM | POA: Diagnosis not present

## 2015-08-24 DIAGNOSIS — Z136 Encounter for screening for cardiovascular disorders: Secondary | ICD-10-CM | POA: Diagnosis not present

## 2015-08-24 DIAGNOSIS — K760 Fatty (change of) liver, not elsewhere classified: Secondary | ICD-10-CM

## 2015-08-24 DIAGNOSIS — Z1383 Encounter for screening for respiratory disorder NEC: Secondary | ICD-10-CM

## 2015-08-24 DIAGNOSIS — Z1212 Encounter for screening for malignant neoplasm of rectum: Secondary | ICD-10-CM

## 2015-08-24 LAB — COMPREHENSIVE METABOLIC PANEL
ALBUMIN: 4.3 g/dL (ref 3.6–5.1)
ALT: 41 U/L — ABNORMAL HIGH (ref 6–29)
AST: 53 U/L — AB (ref 10–35)
Alkaline Phosphatase: 75 U/L (ref 33–130)
BILIRUBIN TOTAL: 0.5 mg/dL (ref 0.2–1.2)
BUN: 16 mg/dL (ref 7–25)
CO2: 25 mmol/L (ref 20–31)
CREATININE: 0.65 mg/dL (ref 0.50–1.05)
Calcium: 9.4 mg/dL (ref 8.6–10.4)
Chloride: 101 mmol/L (ref 98–110)
Glucose, Bld: 174 mg/dL — ABNORMAL HIGH (ref 65–99)
Potassium: 4 mmol/L (ref 3.5–5.3)
SODIUM: 137 mmol/L (ref 135–146)
TOTAL PROTEIN: 7.5 g/dL (ref 6.1–8.1)

## 2015-08-24 LAB — CBC
HEMATOCRIT: 42.4 % (ref 35.0–45.0)
HEMOGLOBIN: 14.3 g/dL (ref 11.7–15.5)
MCH: 28.7 pg (ref 27.0–33.0)
MCHC: 33.7 g/dL (ref 32.0–36.0)
MCV: 85 fL (ref 80.0–100.0)
MPV: 9.3 fL (ref 7.5–12.5)
Platelets: 399 10*3/uL (ref 140–400)
RBC: 4.99 MIL/uL (ref 3.80–5.10)
RDW: 13.4 % (ref 11.0–15.0)
WBC: 9.8 10*3/uL (ref 3.8–10.8)

## 2015-08-24 LAB — POCT URINALYSIS DIP (MANUAL ENTRY)
BILIRUBIN UA: NEGATIVE
Ketones, POC UA: NEGATIVE
Leukocytes, UA: NEGATIVE
Nitrite, UA: NEGATIVE
Spec Grav, UA: 1.025
Urobilinogen, UA: 0.2
pH, UA: 5

## 2015-08-24 LAB — LIPID PANEL
CHOLESTEROL: 158 mg/dL (ref 125–200)
HDL: 36 mg/dL — ABNORMAL LOW (ref >=46)
LDL CALC: 83 mg/dL (ref <130)
TRIGLYCERIDES: 196 mg/dL — AB (ref <150)
Total CHOL/HDL Ratio: 4.4 Ratio (ref <=5.0)
VLDL: 39 mg/dL — ABNORMAL HIGH (ref <30)

## 2015-08-24 LAB — POCT GLYCOSYLATED HEMOGLOBIN (HGB A1C): Hemoglobin A1C: 7.4

## 2015-08-24 LAB — TSH: TSH: 1.05 mIU/L

## 2015-08-24 MED ORDER — SITAGLIPTIN PHOSPHATE 100 MG PO TABS: 100.0000 mg | ORAL_TABLET | Freq: Every day | ORAL | 1 refills | Status: DC

## 2015-08-24 NOTE — Patient Instructions (Addendum)
We recommend that you schedule a mammogram for breast cancer screening. Typically, you do not need a referral to do this. Please contact a local imaging center to schedule your mammogram.  [facility] - [phone]  *ask for the Radiology Department [facility] - [phone] or [phone]  [facility] - [phone] [place] [phone] [facility] - [phone]  *ask for the [facility] - [phone]  *ask for the Radiology Department [facility] - [phone]  *ask for the [place] - [phone]    IF you received an x-ray today, you will receive an invoice from [facility]. Please contact [facility] at [phone] with questions or concerns regarding your invoice.   IF you received labwork today, you will receive an invoice from [employer]. Please contact [facility] at [phone] with questions or concerns regarding your invoice.   Our billing staff will not be able to assist you with questions regarding bills from these companies.  You will be contacted with the lab results as soon as they are available. The fastest way to get your results is to activate your My Chart account. Instructions are located on the last page of this paperwork. If you have not heard from [place] regarding the results in 2 weeks, please contact this office.

## 2015-08-25 LAB — VITAMIN D 25 HYDROXY (VIT D DEFICIENCY, FRACTURES): Vit D, 25-Hydroxy: 27 ng/mL — ABNORMAL LOW (ref 30–100)

## 2015-09-13 ENCOUNTER — Encounter: Payer: Self-pay | Admitting: Family Medicine

## 2015-09-25 ENCOUNTER — Telehealth: Payer: Self-pay

## 2015-09-25 NOTE — Telephone Encounter (Signed)
metoprolol (LOPRESSOR) 50 MG tablet glipiZIDE (GLUCOTROL) 5 MG table  Pharmacy Pharmacy: [facility] [place], [place]   [phone]

## 2015-09-26 MED ORDER — GLIPIZIDE 5 MG PO TABS: 5.0000 mg | ORAL_TABLET | Freq: Every day | ORAL | 1 refills | Status: DC

## 2015-09-26 MED ORDER — METOPROLOL TARTRATE 50 MG PO TABS: 50.0000 mg | ORAL_TABLET | Freq: Two times a day (BID) | ORAL | 1 refills | Status: DC

## 2015-09-26 NOTE — Telephone Encounter (Signed)
Sent in RFs and notified pt. 

## 2015-09-27 ENCOUNTER — Other Ambulatory Visit: Payer: Self-pay | Admitting: Family Medicine

## 2015-09-27 DIAGNOSIS — N6012 Diffuse cystic mastopathy of left breast: Secondary | ICD-10-CM

## 2015-10-10 ENCOUNTER — Other Ambulatory Visit: Payer: Self-pay

## 2015-10-10 ENCOUNTER — Ambulatory Visit
Admission: RE | Admit: 2015-10-10 | Discharge: 2015-10-10 | Disposition: A | Discharge status: Home / Self Care | Payer: Managed Care, Other (non HMO) | Source: Ambulatory Visit | Attending: Family Medicine | Admitting: Family Medicine

## 2015-10-10 DIAGNOSIS — N6012 Diffuse cystic mastopathy of left breast: Secondary | ICD-10-CM

## 2015-10-18 IMAGING — CR DG CHEST 2V
2 series · 2 of 2 positions shown · non-contrast
Comparison: 08/24/2008; chest CT - 09/10/2013

CLINICAL DATA: History of endometrial cancer, melanoma, diabetes,
bronchitis, GERD. Former smoker. Initial encounter.

EXAM:
CHEST  2 VIEW

[w chest pa]
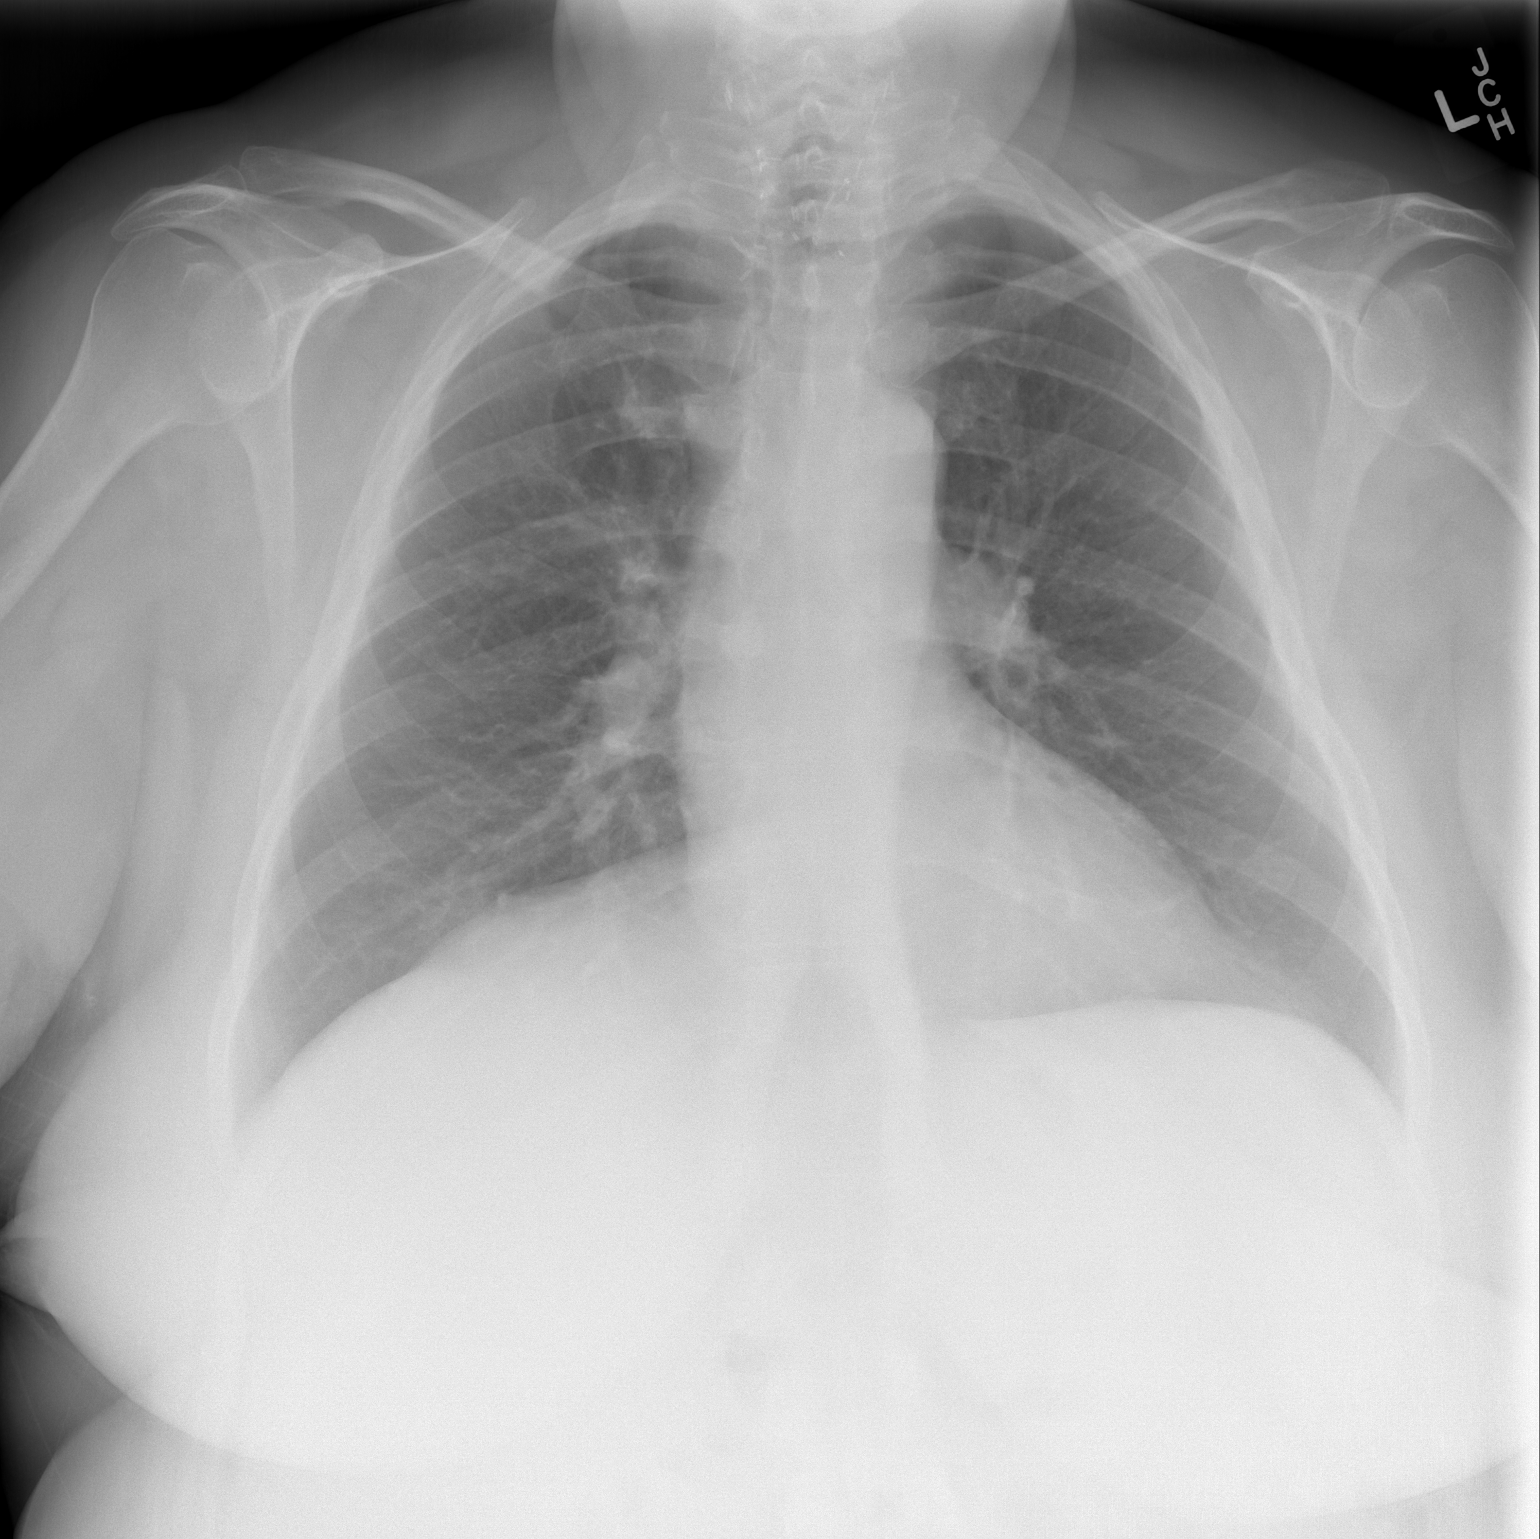

[w chest lat *]
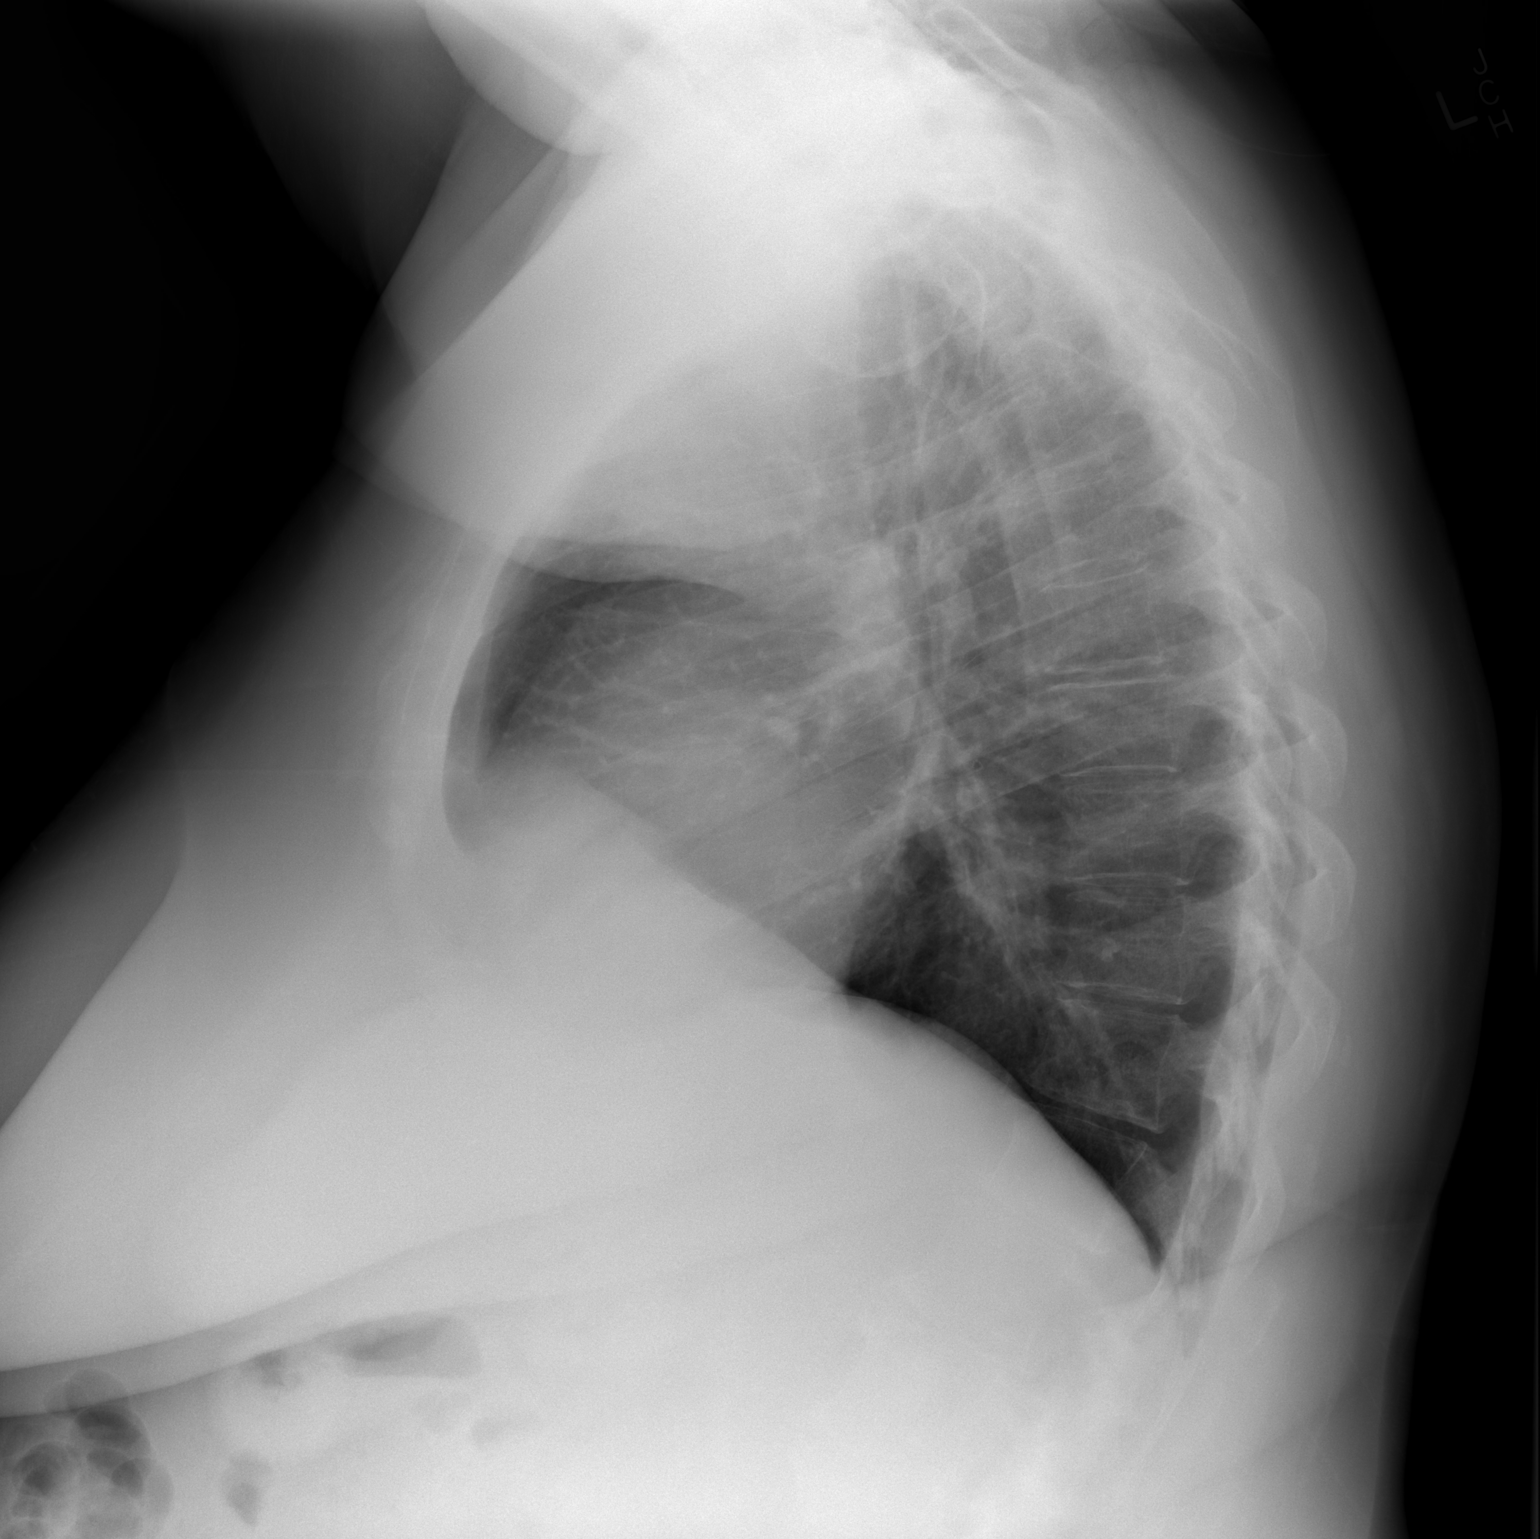

[2 of 2 positions shown; findings below may reference images not displayed]

FINDINGS: Grossly unchanged cardiac silhouette and mediastinal contours. There
is mild elevation / eventration the medial aspect of the right
hemidiaphragm. Evaluation of the retrosternal clear space is
obscured secondary overlying soft tissues. No focal airspace
opacities. No pleural effusion or pneumothorax. No evidence of
edema. No acute osseous abnormalities. Surgical clips overlie the
thoracic inlet.
IMPRESSION: Cardiomegaly without acute cardiopulmonary disease.

## 2015-11-08 ENCOUNTER — Other Ambulatory Visit: Payer: Self-pay

## 2015-11-08 ENCOUNTER — Ambulatory Visit (INDEPENDENT_AMBULATORY_CARE_PROVIDER_SITE_OTHER): Payer: Managed Care, Other (non HMO) | Admitting: Family Medicine

## 2015-11-08 DIAGNOSIS — Z23 Encounter for immunization: Secondary | ICD-10-CM | POA: Diagnosis not present

## 2015-11-08 NOTE — Telephone Encounter (Signed)
Msg is for Donna [name],  Pt needs a refill on tramadol and ibuprofen  Please advise  [phone]

## 2015-11-09 NOTE — Telephone Encounter (Signed)
Last exam and labs 07/2015, next scheduled 11/2015 04/2015 last refill with 1 additional.

## 2015-11-11 ENCOUNTER — Other Ambulatory Visit: Payer: Self-pay | Admitting: Family Medicine

## 2015-11-11 ENCOUNTER — Telehealth: Payer: Self-pay

## 2015-11-11 MED ORDER — IBUPROFEN 800 MG PO TABS: 800.0000 mg | ORAL_TABLET | Freq: Three times a day (TID) | ORAL | 1 refills | Status: DC | PRN

## 2015-11-11 MED ORDER — TRAMADOL HCL 50 MG PO TABS: 50.0000 mg | ORAL_TABLET | Freq: Three times a day (TID) | ORAL | 0 refills | Status: DC | PRN

## 2015-11-11 NOTE — Telephone Encounter (Signed)
Pt is stating that surescripts is claiming that they have not recevied the request from [place]   Please resend

## 2015-11-11 NOTE — Telephone Encounter (Signed)
Please fax or call into pharmacy and let pt know. Thanks.

## 2015-11-13 ENCOUNTER — Telehealth: Payer: Self-pay | Admitting: Emergency Medicine

## 2015-11-13 NOTE — Telephone Encounter (Signed)
Left message to return call 

## 2015-11-14 ENCOUNTER — Telehealth: Payer: Self-pay | Admitting: Family Medicine

## 2015-11-14 NOTE — Telephone Encounter (Signed)
Ibuprofen mailed was shipped to patient today and Tramadol script called in because it was faxed to retail pharmacy instead of [facility].   Tramadol called in to [facility] per pharmacist.

## 2015-11-14 NOTE — Telephone Encounter (Signed)
Patient left message on lab vm about rx for ibuprofen and tramadol.  She states that [facility]    She can be contacted back at [phone] after 530pm.

## 2015-11-14 NOTE — Telephone Encounter (Signed)
lmom to call and reschedule appt that she had with Dr Brigitte Pulse on 12-28-2015 for a CPE the Doctor will be in class that day

## 2015-11-15 NOTE — Telephone Encounter (Signed)
LMOM for pt that it looks like Rxs were done and faxed in on Sunday, but asked her to call if [facility] doesn't have them.

## 2015-12-06 ENCOUNTER — Ambulatory Visit (INDEPENDENT_AMBULATORY_CARE_PROVIDER_SITE_OTHER): Payer: Managed Care, Other (non HMO) | Admitting: Physician Assistant

## 2015-12-06 VITALS — BP 126/82 | HR 85 | Temp 98.1°F | Resp 18 | Ht 66.0 in | Wt 254.0 lb

## 2015-12-06 DIAGNOSIS — J069 Acute upper respiratory infection, unspecified: Secondary | ICD-10-CM | POA: Diagnosis not present

## 2015-12-06 DIAGNOSIS — B9789 Other viral agents as the cause of diseases classified elsewhere: Secondary | ICD-10-CM

## 2015-12-06 MED ORDER — NAPROXEN 500 MG PO TABS: 500.0000 mg | ORAL_TABLET | Freq: Two times a day (BID) | ORAL | 0 refills | Status: DC

## 2015-12-06 MED ORDER — CETIRIZINE-PSEUDOEPHEDRINE ER 5-120 MG PO TB12: 1.0000 | ORAL_TABLET | Freq: Two times a day (BID) | ORAL | 0 refills | Status: DC

## 2015-12-06 MED ORDER — HYDROCODONE-HOMATROPINE 5-1.5 MG/5ML PO SYRP: 5.0000 mL | ORAL_SOLUTION | Freq: Two times a day (BID) | ORAL | 0 refills | Status: DC

## 2015-12-06 NOTE — Patient Instructions (Signed)
     IF you received an x-ray today, you will receive an invoice from [facility]. Please contact [facility] at [phone] with questions or concerns regarding your invoice.   IF you received labwork today, you will receive an invoice from [facility]. Please contact [facility] at [phone] with questions or concerns regarding your invoice.   Our billing staff will not be able to assist you with questions regarding bills from these companies.  You will be contacted with the lab results as soon as they are available. The fastest way to get your results is to activate your My Chart account. Instructions are located on the last page of this paperwork. If you have not heard from us regarding the results in 2 weeks, please contact this office.      

## 2015-12-06 NOTE — Progress Notes (Signed)
12/06/2015 5:48 PM   DOB: 05-13-1955 / MRN: [id number]  SUBJECTIVE:  Donna [name] is a 60 y.o. female presenting for cough that started 4 days ago.  States that this started with nasal congestion.  She associates sore throat. Denies fever, chills, nausea, change in appetite. She smoked for about 15-20 at about 1/2 pack a day or so.  She denies a history of asthma.   She is allergic to glimepiride; morphine and related; colcrys [colchicine]; and lisinopril.   She  has a past medical history of Allergy; Arthritis; Diabetes mellitus without complication ([place]); Difficulty sleeping; Diverticulitis; Endometrial cancer ([place]); GERD (gastroesophageal reflux disease); History of bronchitis; History of gout; Hypertension; Hypothyroid; and Skin cancer (melanoma) ([place]).    She  reports that she quit smoking about 8 years ago. She has never used smokeless tobacco. She reports that she does not drink alcohol or use drugs. She  reports that she does not engage in sexual activity. The patient  has a past surgical history that includes Cholecystectomy; Thyroidectomy; Robotic assisted total hysterectomy with bilateral salpingo oophorectomy (Bilateral, 11/23/2013); Suture removal (N/A, 11/23/2013); and skin cancer removal.  Her family history includes Cancer in her brother and father; Diabetes in her father and paternal uncle; Heart disease in her mother; Stroke in her mother.  Review of Systems  Constitutional: Negative for chills and fever.  Respiratory: Positive for cough. Negative for hemoptysis, sputum production, shortness of breath and wheezing.   Cardiovascular: Negative for chest pain and leg swelling.  Gastrointestinal: Negative for nausea.  Skin: Negative for itching and rash.  Neurological: Negative for dizziness.    The problem list and medications were reviewed and updated by myself where necessary and exist elsewhere in the encounter.   OBJECTIVE:  BP 126/82 (BP Location: Right Arm, Patient  Position: Sitting, Cuff Size: Large)   Pulse 85   Temp 98.1 F (36.7 C) (Oral)   Resp 18   Ht 5\' 6"  (1.676 m)   Wt 254 lb (115.2 kg)   SpO2 94%   BMI 41.00 kg/m   Physical Exam  Constitutional: She is oriented to person, place, and time. She appears well-developed and well-nourished. No distress.  Cardiovascular: Normal rate and regular rhythm.   Pulmonary/Chest: Effort normal and breath sounds normal.  Musculoskeletal: Normal range of motion.  Neurological: She is alert and oriented to person, place, and time. She displays normal reflexes. No cranial nerve deficit. She exhibits normal muscle tone. Coordination normal.  Skin: Skin is warm and dry. She is not diaphoretic.    Lab Results  Component Value Date   HGBA1C 7.4 08/24/2015   Lab Results  Component Value Date   CREATININE 0.65 08/24/2015     No results found for this or any previous visit (from the past 72 hour(s)).  No results found.  ASSESSMENT AND PLAN  Donna [name] was seen today for bronchitis, cough and headache.  Diagnoses and all orders for this visit:  Viral URI with cough -     naproxen (NAPROSYN) 500 MG tablet; Take 1 tablet (500 mg total) by mouth 2 (two) times daily with a meal. -     HYDROcodone-homatropine (HYCODAN) 5-1.5 MG/5ML syrup; Take 5 mLs by mouth every 12 (twelve) hours. -     cetirizine-pseudoephedrine (ZYRTEC-D ALLERGY & CONGESTION) 5-120 MG tablet; Take 1 tablet by mouth 2 (two) times daily.    The patient is advised to call or return to clinic if she does not see an improvement in symptoms, or  to seek the care of the closest emergency department if she worsens with the above plan.   Philis Fendt, MHS, PA-C [facility] 12/06/2015 5:48 PM

## 2015-12-06 NOTE — Progress Notes (Deleted)
    MRN: [id number] DOB: [birth year]  Subjective:   Donna [name] is a 60 y.o. female presenting for chief complaint of Bronchitis; Cough; and Headache    Donna [name] has a current medication list which includes the following prescription(s): accu-chek aviva plus, accu-chek softclix lancets, albuterol, cyclobenzaprine, ergocalciferol, fluocinolone acetonide, glipizide, ibuprofen, ipratropium, loratadine, losartan-hydrochlorothiazide, metformin, metoprolol, montelukast, pantoprazole, potassium chloride, ranitidine, sitagliptin, and tramadol. Also is allergic to glimepiride; morphine and related; colcrys [colchicine]; and lisinopril.  Donna [name]  has a past medical history of Allergy; Arthritis; Diabetes mellitus without complication ([place]); Difficulty sleeping; Diverticulitis; Endometrial cancer ([place]); GERD (gastroesophageal reflux disease); History of bronchitis; History of gout; Hypertension; Hypothyroid; and Skin cancer (melanoma) ([place]). Also  has a past surgical history that includes Cholecystectomy; Thyroidectomy; Robotic assisted total hysterectomy with bilateral salpingo oophorectomy (Bilateral, 11/23/2013); Suture removal (N/A, 11/23/2013); and skin cancer removal.   Objective:   Vitals: BP 126/82 (BP Location: Right Arm, Patient Position: Sitting, Cuff Size: Large)   Pulse 85   Temp 98.1 F (36.7 C) (Oral)   Resp 18   Ht 5\' 6"  (1.676 m)   Wt 254 lb (115.2 kg)   SpO2 94%   BMI 41.00 kg/m   Physical Exam  No results found for this or any previous visit (from the past 24 hour(s)).  Assessment and Plan :     Jaynee Eagles, PA-C [facility] [phone] 12/06/2015 5:19 PM

## 2015-12-07 ENCOUNTER — Other Ambulatory Visit: Payer: Self-pay

## 2015-12-07 DIAGNOSIS — J069 Acute upper respiratory infection, unspecified: Secondary | ICD-10-CM

## 2015-12-07 DIAGNOSIS — B9789 Other viral agents as the cause of diseases classified elsewhere: Principal | ICD-10-CM

## 2015-12-07 MED ORDER — CETIRIZINE-PSEUDOEPHEDRINE ER 5-120 MG PO TB12: 1.0000 | ORAL_TABLET | Freq: Two times a day (BID) | ORAL | 0 refills | Status: DC

## 2015-12-07 MED ORDER — HYDROCODONE-HOMATROPINE 5-1.5 MG/5ML PO SYRP: 5.0000 mL | ORAL_SOLUTION | Freq: Two times a day (BID) | ORAL | 0 refills | Status: DC

## 2015-12-07 MED ORDER — NAPROXEN 500 MG PO TABS: 500.0000 mg | ORAL_TABLET | Freq: Two times a day (BID) | ORAL | 0 refills | Status: DC

## 2015-12-07 NOTE — Telephone Encounter (Signed)
Pt called &requested change of pharmacy to [facility] from [facility]

## 2015-12-17 ENCOUNTER — Other Ambulatory Visit: Payer: Self-pay | Admitting: Family Medicine

## 2015-12-28 ENCOUNTER — Ambulatory Visit: Payer: Managed Care, Other (non HMO) | Admitting: Family Medicine

## 2016-01-04 ENCOUNTER — Ambulatory Visit (INDEPENDENT_AMBULATORY_CARE_PROVIDER_SITE_OTHER): Payer: Managed Care, Other (non HMO) | Admitting: Family Medicine

## 2016-01-04 ENCOUNTER — Encounter: Payer: Self-pay | Admitting: Family Medicine

## 2016-01-04 VITALS — BP 142/82 | HR 86 | Temp 98.8°F | Resp 16 | Ht 65.5 in | Wt 252.4 lb

## 2016-01-04 DIAGNOSIS — E559 Vitamin D deficiency, unspecified: Secondary | ICD-10-CM

## 2016-01-04 DIAGNOSIS — R062 Wheezing: Secondary | ICD-10-CM

## 2016-01-04 DIAGNOSIS — M545 Low back pain, unspecified: Secondary | ICD-10-CM

## 2016-01-04 DIAGNOSIS — G8929 Other chronic pain: Secondary | ICD-10-CM

## 2016-01-04 DIAGNOSIS — E890 Postprocedural hypothyroidism: Secondary | ICD-10-CM | POA: Diagnosis not present

## 2016-01-04 DIAGNOSIS — R05 Cough: Secondary | ICD-10-CM | POA: Diagnosis not present

## 2016-01-04 DIAGNOSIS — K760 Fatty (change of) liver, not elsewhere classified: Secondary | ICD-10-CM | POA: Diagnosis not present

## 2016-01-04 DIAGNOSIS — R059 Cough, unspecified: Secondary | ICD-10-CM

## 2016-01-04 DIAGNOSIS — I10 Essential (primary) hypertension: Secondary | ICD-10-CM

## 2016-01-04 DIAGNOSIS — E1165 Type 2 diabetes mellitus with hyperglycemia: Secondary | ICD-10-CM

## 2016-01-04 LAB — POCT GLYCOSYLATED HEMOGLOBIN (HGB A1C): HEMOGLOBIN A1C: 7.6

## 2016-01-04 MED ORDER — MONTELUKAST SODIUM 10 MG PO TABS: 10.0000 mg | ORAL_TABLET | Freq: Every day | ORAL | 3 refills | Status: DC

## 2016-01-04 MED ORDER — LOSARTAN POTASSIUM-HCTZ 100-12.5 MG PO TABS: 1.0000 | ORAL_TABLET | Freq: Every day | ORAL | 1 refills | Status: DC

## 2016-01-04 MED ORDER — ALBUTEROL SULFATE HFA 108 (90 BASE) MCG/ACT IN AERS: 2.0000 | INHALATION_SPRAY | RESPIRATORY_TRACT | 11 refills | Status: DC | PRN

## 2016-01-04 MED ORDER — GLIPIZIDE 5 MG PO TABS: 5.0000 mg | ORAL_TABLET | Freq: Two times a day (BID) | ORAL | 1 refills | Status: DC

## 2016-01-04 MED ORDER — HYDROCOD POLST-CPM POLST ER 10-8 MG/5ML PO SUER: 5.0000 mL | Freq: Two times a day (BID) | ORAL | 0 refills | Status: DC | PRN

## 2016-01-04 MED ORDER — ERGOCALCIFEROL 1.25 MG (50000 UT) PO CAPS: 50000.0000 [IU] | ORAL_CAPSULE | ORAL | 3 refills | Status: DC

## 2016-01-04 MED ORDER — TRAMADOL HCL 50 MG PO TABS: 50.0000 mg | ORAL_TABLET | Freq: Three times a day (TID) | ORAL | 1 refills | Status: DC | PRN

## 2016-01-04 MED ORDER — METOPROLOL TARTRATE 50 MG PO TABS: 50.0000 mg | ORAL_TABLET | Freq: Two times a day (BID) | ORAL | 1 refills | Status: DC

## 2016-01-04 MED ORDER — CYCLOBENZAPRINE HCL 10 MG PO TABS: 10.0000 mg | ORAL_TABLET | Freq: Every day | ORAL | 3 refills | Status: DC

## 2016-01-04 NOTE — Patient Instructions (Addendum)
IF you received an x-ray today, you will receive an invoice from [facility]. Please contact [facility] at [phone] with questions or concerns regarding your invoice.   IF you received labwork today, you will receive an invoice from [employer]. Please contact [facility] at [phone] with questions or concerns regarding your invoice.   Our billing staff will not be able to assist you with questions regarding bills from these companies.  You will be contacted with the lab results as soon as they are available. The fastest way to get your results is to activate your My Chart account. Instructions are located on the last page of this paperwork. If you have not heard from [place] regarding the results in 2 weeks, please contact this office.     Complementary and Alternative Medical Therapies for Diabetes Complementary and alternative medical therapies are treatments that are different from typical medical treatments (Western treatments). Complementary means that the therapy is used with Western treatments. Alternative means that the therapy is used instead of Western treatments. Are these therapies safe? Some of these therapies are usually safe. Others may be harmful. Often, there is not enough research to show how safe or effective a therapy is. If you want to try a complementary or alternative therapy, talk with your health care provider to make sure it is safe. What are some therapies that are used to treat diabetes? Acupuncture Acupuncture is the insertion of needles into certain places on the skin. This is done by a professional. This has been shown to relieve long-term (chronic) pain. It may help if you have painful nerve damage. Biofeedback Biofeedback helps you be more aware of your body's response to pain. It also helps you learn to deal with pain. Biofeedback is about relaxing and reducing stress. An example of a biofeedback technique is  guided imagery. This involves creating peaceful images in your mind. Chromium Chromium is a substance that can help improve how insulin works. Chromium is in many foods, including whole grains, nuts, and egg yolks. Chromium may also be taken as a supplement. Taking chromium supplements may help control diabetes, especially if you have a lack (deficiency) of chromium. However, research has not proven this. If you have kidney problems, you should be careful with chromium supplements. American ginseng American ginseng is an herb that may lower glucose levels. It may lower glucose when you do not eat for periods of time (fast) and after you eat. It may also help lower A1c levels. More research is needed before recommendations for ginseng use can be made. Magnesium Magnesium is a mineral found in many foods, such as whole grains, nuts, and green leafy vegetables. Having low magnesium levels may make controlling blood glucose more difficult for people with type 2 diabetes. Low magnesium levels may also contribute to certain diabetes complications. Getting more magnesium and eating a high-fiber diet may reduce the risk of developing type 2 diabetes. Vanadium Vanadium is a compound found in small amounts in certain plants and animals. Some studies show that it improves glucose levels in animals with diabetes. In one study, people with diabetes were able to lower their insulin dosage when taking vanadium. More research about side effects and safe dosage levels is needed. Cinnamon Cinnamon may decrease insulin resistance, increase insulin production, and lower blood glucose levels. It may work best when used with diabetes medicines. This information is not intended to replace advice given to you by your health care provider. Make sure you  discuss any questions you have with your health care provider. Document Released: 11/11/2006 Document Revised: 05/24/2015 Document Reviewed: 11/12/2014 Elsevier Interactive  Patient Education  2017 [employer].

## 2016-01-04 NOTE — Progress Notes (Signed)
FOLL

## 2016-01-04 NOTE — Progress Notes (Signed)
Subjective:    Patient ID: Donna [name], female    DOB: 1955-05-12, 60 y.o.   MRN: [id number] Chief Complaint  Patient presents with  . Follow-up    DIABETES  . Medication Refill    ALBUTEROL IN    HPI DMII: 7.4 -> 7.6.  Poor eating choice for lunch, sometimes unhealthy. No lows. Gets hot when it is to high.  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 [employer]. Not watching what she is eating.  Only takes K when you start cramping - eats tums when she starts cramping - no laix Mustard can't tolerate it.  occ bowels sound and gas with poor diet.  Diffuser  Daughter-in-law brooke is primary care NP outside of [place] and likes holistic/homeopathic interventions as well so does try to encourage pt in healthy lifestyle choices but pt is just not invested in really exertring her efforts in healthy diet/exewrcise at this time despite risks.  Past Medical History:  Diagnosis Date  . Allergy   . Arthritis   . Diabetes mellitus without complication ([place])   . Difficulty sleeping   . Diverticulitis   . Endometrial cancer ([place])   . GERD (gastroesophageal reflux disease)   . History of bronchitis   . History of gout   . Hypertension   . Hypothyroid   . Skin cancer (melanoma) ([place])    to be removed 12/09/13   Past Surgical History:  Procedure Laterality Date  . CHOLECYSTECTOMY    . ROBOTIC ASSISTED TOTAL HYSTERECTOMY WITH BILATERAL SALPINGO OOPHERECTOMY Bilateral 11/23/2013   Procedure: ROBOTIC ASSISTED TOTAL HYSTERECTOMY WITH BILATERAL SALPINGO OOPHORECTOMY, LYMPH NODE DISSECTION ,LYSIS OF ADHESIONS,PELVIC WASHINGS ;  Surgeon: Everitt Amber, MD;  Location: [facility];  Service: Gynecology;  Laterality: Bilateral;  . skin cancer removal     melanoma removed from back   . SUTURE REMOVAL N/A 11/23/2013   Procedure: SUTURE REMOVAL;  Surgeon: Everitt Amber, MD;  Location: [facility];  Service: Gynecology;  Laterality: N/A;  . THYROIDECTOMY     Current Outpatient Prescriptions on File Prior to Visit    Medication Sig Dispense Refill  . Fluocinolone Acetonide 0.01 % OIL Place 5 drops in ear(s) 2 (two) times daily as needed ("eczema in ears"). 20 mL 5  . ibuprofen (ADVIL,MOTRIN) 800 MG tablet Take 1 tablet (800 mg total) by mouth 3 (three) times daily as needed. 270 tablet 1  . ipratropium (ATROVENT) 0.06 % nasal spray Place 2 sprays into both nostrils 3 (three) times daily as needed for rhinitis. Reported on 03/23/2015    . loratadine (CLARITIN) 10 MG tablet Take 1 tablet (10 mg total) by mouth daily. 30 tablet 11  . metformin (FORTAMET) 1000 MG (OSM) 24 hr tablet Take 2 tablets (2,000 mg total) by mouth at bedtime. 180 tablet 3  . naproxen (NAPROSYN) 500 MG tablet Take 1 tablet (500 mg total) by mouth 2 (two) times daily with a meal. 30 tablet 0  . pantoprazole (PROTONIX) 40 MG tablet Take 1 tablet (40 mg total) by mouth every evening. 90 tablet 3  . potassium chloride (K-DUR,KLOR-CON) 10 MEQ tablet Take 1 tablet (10 mEq total) by mouth every evening. 90 tablet 1  . ranitidine (ZANTAC) 150 MG tablet TAKE 1 TABLET TWICE A DAY 180 tablet 1  . ACCU-CHEK AVIVA PLUS test strip USE 1 STRIP DAILY FOR GLUCOSE TESTING 100 each 11  . ACCU-CHEK SOFTCLIX LANCETS lancets USE 1 DAILY FOR GLUCOSE TESTING 100 each 0   No current facility-administered medications  on file prior to visit.    Allergies  Allergen Reactions  . Glimepiride Other (See Comments)    Per patient causes severe gas pain  . Morphine And Related Nausea And Vomiting  . Colcrys [Colchicine] Rash  . Lisinopril Rash   Family History  Problem Relation Age of Onset  . Stroke Mother   . Heart disease Mother   . Cancer Father   . Diabetes Father   . Cancer Brother   . Diabetes Paternal Uncle    Social History   Social History  . Marital status: Single    Spouse name: N/A  . Number of children: N/A  . Years of education: N/A   Social History Main Topics  . Smoking status: Former Smoker    Quit date: 11/20/2007  . Smokeless  tobacco: Never Used  . Alcohol use No     Comment: occasional  . Drug use: No  . Sexual activity: No   Other Topics Concern  . None   Social History Narrative  . None   Depression screen [facility] 2/9 01/04/2016 12/06/2015 08/24/2015 12/15/2014 05/13/2014  Decreased Interest 0 0 0 0 0  Down, Depressed, Hopeless 0 0 0 0 0  PHQ - 2 Score 0 0 0 0 0    Review of Systems See hpi    Objective:   Physical Exam  Constitutional: She is oriented to person, place, and time. She appears well-developed and well-nourished. No distress.  HENT:  Head: Normocephalic and atraumatic.  Right Ear: External ear normal.  Left Ear: External ear normal.  Eyes: Conjunctivae are normal. No scleral icterus.  Neck: Normal range of motion. Neck supple. No thyromegaly present.  Cardiovascular: Normal rate, regular rhythm, normal heart sounds and intact distal pulses.   Pulmonary/Chest: Effort normal and breath sounds normal. No respiratory distress.  Musculoskeletal: She exhibits no edema.  Lymphadenopathy:    She has no cervical adenopathy.  Neurological: She is alert and oriented to person, place, and time.  Skin: Skin is warm and dry. She is not diaphoretic. No erythema.  Psychiatric: She has a normal mood and affect. Her behavior is normal.      BP (!) 142/82 (BP Location: Left Arm, Patient Position: Sitting, Cuff Size: Large)   Pulse 86   Temp 98.8 F (37.1 C) (Oral)   Resp 16   Ht 5' 5.5" (1.664 m)   Wt 252 lb 6.4 oz (114.5 kg)   SpO2 97%   BMI 41.36 kg/m      Results for orders placed or performed in visit on 01/04/16  POCT glycosylated hemoglobin (Hb A1C)  Result Value Ref Range   Hemoglobin A1C 7.6     Assessment & Plan:   1. Uncontrolled type 2 diabetes mellitus with hyperglycemia, without long-term current use of insulin ([place])   2. Essential hypertension, benign   3. Fatty infiltration of liver   4. Postoperative hypothyroidism   5. Vitamin D deficiency   6. Chronic bilateral low  back pain without sciatica   7. Cough   8. Wheezing     Orders Placed This Encounter  Procedures  . POCT glycosylated hemoglobin (Hb A1C)    Meds ordered this encounter  Medications  . albuterol (PROVENTIL HFA;VENTOLIN HFA) 108 (90 Base) MCG/ACT inhaler    Sig: Inhale 2 puffs into the lungs every 4 (four) hours as needed for wheezing or shortness of breath.    Dispense:  1 Inhaler    Refill:  11  . ergocalciferol (VITAMIN  D2) 50000 units capsule    Sig: Take 1 capsule (50,000 Units total) by mouth once a week.    Dispense:  12 capsule    Refill:  3  . cyclobenzaprine (FLEXERIL) 10 MG tablet    Sig: Take 1 tablet (10 mg total) by mouth at bedtime.    Dispense:  30 tablet    Refill:  3  . glipiZIDE (GLUCOTROL) 5 MG tablet    Sig: Take 1 tablet (5 mg total) by mouth 2 (two) times daily before a meal.    Dispense:  180 tablet    Refill:  1  . metoprolol (LOPRESSOR) 50 MG tablet    Sig: Take 1 tablet (50 mg total) by mouth 2 (two) times daily.    Dispense:  180 tablet    Refill:  1  . montelukast (SINGULAIR) 10 MG tablet    Sig: Take 1 tablet (10 mg total) by mouth at bedtime.    Dispense:  90 tablet    Refill:  3  . traMADol (ULTRAM) 50 MG tablet    Sig: Take 1 tablet (50 mg total) by mouth every 8 (eight) hours as needed.    Dispense:  270 tablet    Refill:  1    This is 3 month supply with a refill for a total of 6 months of medication  . chlorpheniramine-HYDROcodone (TUSSIONEX PENNKINETIC ER) 10-8 MG/5ML SUER    Sig: Take 5 mLs by mouth every 12 (twelve) hours as needed.    Dispense:  120 mL    Refill:  0  . losartan-hydrochlorothiazide (HYZAAR) 100-12.5 MG tablet    Sig: Take 1 tablet by mouth daily.    Dispense:  90 tablet    Refill:  1   Over 25 min spent in face-to-face evaluation of and consultation with patient and coordination of care.  Over 50% of this time was spent counseling this patient.   [name], M.D.  [facility] [address] [place], [place] [address] [phone] phone [phone] fax  02/02/16 12:02 PM

## 2016-04-26 ENCOUNTER — Other Ambulatory Visit: Payer: Self-pay | Admitting: Family Medicine

## 2016-04-26 NOTE — Telephone Encounter (Signed)
Pt always comes in every 4 months for refills of her tramadol. Thanks for refilling metformin - perfect.

## 2016-04-26 NOTE — Telephone Encounter (Signed)
Dr. Brigitte Pulse,  I have authorized the metformin refill for this patient. When did you want to see her back?  Meds ordered this encounter  Medications  . metformin (FORTAMET) 1000 MG (OSM) 24 hr tablet    Sig: TAKE 2 TABLETS (2,000 MG TOTAL) AT BEDTIME    Dispense:  180 tablet    Refill:  3

## 2016-06-12 ENCOUNTER — Encounter: Payer: Self-pay | Admitting: Gynecology

## 2016-07-05 ENCOUNTER — Telehealth: Payer: Self-pay | Admitting: Family Medicine

## 2016-07-10 NOTE — Telephone Encounter (Signed)
error 

## 2016-07-13 ENCOUNTER — Encounter: Payer: Self-pay | Admitting: Family Medicine

## 2016-07-13 ENCOUNTER — Ambulatory Visit (INDEPENDENT_AMBULATORY_CARE_PROVIDER_SITE_OTHER): Payer: Managed Care, Other (non HMO) | Admitting: Family Medicine

## 2016-07-13 VITALS — BP 130/82 | HR 95 | Temp 98.1°F | Resp 16 | Ht 64.75 in | Wt 246.5 lb

## 2016-07-13 DIAGNOSIS — E1165 Type 2 diabetes mellitus with hyperglycemia: Secondary | ICD-10-CM | POA: Diagnosis not present

## 2016-07-13 DIAGNOSIS — K760 Fatty (change of) liver, not elsewhere classified: Secondary | ICD-10-CM

## 2016-07-13 DIAGNOSIS — M545 Low back pain: Secondary | ICD-10-CM | POA: Diagnosis not present

## 2016-07-13 DIAGNOSIS — I10 Essential (primary) hypertension: Secondary | ICD-10-CM

## 2016-07-13 DIAGNOSIS — E559 Vitamin D deficiency, unspecified: Secondary | ICD-10-CM

## 2016-07-13 DIAGNOSIS — E890 Postprocedural hypothyroidism: Secondary | ICD-10-CM

## 2016-07-13 DIAGNOSIS — G8929 Other chronic pain: Secondary | ICD-10-CM

## 2016-07-13 LAB — POCT GLYCOSYLATED HEMOGLOBIN (HGB A1C): Hemoglobin A1C: 8.1

## 2016-07-13 MED ORDER — TRAMADOL HCL 50 MG PO TABS: 50.0000 mg | ORAL_TABLET | Freq: Three times a day (TID) | ORAL | 1 refills | Status: DC | PRN

## 2016-07-13 MED ORDER — METOPROLOL SUCCINATE ER 100 MG PO TB24: 100.0000 mg | ORAL_TABLET | Freq: Every day | ORAL | 3 refills | Status: DC

## 2016-07-13 MED ORDER — SALINE SPRAY 0.65 % NA SOLN: 1.0000 | NASAL | 0 refills | Status: DC | PRN

## 2016-07-13 MED ORDER — GLIPIZIDE ER 10 MG PO TB24: 10.0000 mg | ORAL_TABLET | Freq: Every day | ORAL | 1 refills | Status: DC

## 2016-07-13 MED ORDER — BLOOD GLUCOSE MONITOR KIT: PACK | 0 refills | Status: DC

## 2016-07-13 NOTE — Progress Notes (Signed)
Subjective:  By signing my name below, I, [name], attest that this documentation has been prepared under the direction and in the presence of [name], MD Electronically Signed: [name], ED Scribe 07/13/2016 at 2:49 PM.   Patient ID: Donna [name], female    DOB: [birth year], 61 y.o.   MRN: [id number]  Chief Complaint  Patient presents with  . Rash    left arm   . Diabetes   HPI Donna [name] is a 61 y.o. female who presents to Primary Care at [facility] complaining of a gradually improving pruritic rash to the left arm, elbow and hand for several days. Pt has applied Cortisone cream and taken Benadryl with relief.   URI Pt recently had bronchitis but still reports some nasal congestion and wheezing. She states she has been using her inhalers more often. Pt does not have a humidifier and states she has not used nasal sprays.   Muscle Cramps  Pt would like to start Magnesium for cramping in her feet and calves. States cramping is worse at night and in the summer. She reports 2-3 normal BMs/day. She is taking Vitamin D every other day and occasionally Flexeril at night.  Diabetes Pt has noticed night sweats recently but has not been checking her blood glucose at home. She reprots that [facility] does not have strips that are compliant for her current glucometer. She takes 2 Metformin tablets at night but states she forgets to take some of her medications; especially her weekend medications. She also states the her diet has been poor while she has been traveling. Pt is not fasting at this visit; last ate breakfast around 9:30 AM today.  She recently had a granddaughter, Tharon Aquas, ~1 month ago.   Past Medical History:  Diagnosis Date  . Allergy   . Arthritis   . Diabetes mellitus without complication ([place])   . Difficulty sleeping   . Diverticulitis   . Endometrial cancer ([place])   . GERD (gastroesophageal reflux disease)   . History of bronchitis   . History of gout   .  Hypertension   . Hypothyroid   . Skin cancer (melanoma) ([place])    to be removed 12/09/13   Current Outpatient Prescriptions on File Prior to Visit  Medication Sig Dispense Refill  . ACCU-CHEK AVIVA PLUS test strip USE 1 STRIP DAILY FOR GLUCOSE TESTING 100 each 11  . ACCU-CHEK SOFTCLIX LANCETS lancets USE 1 DAILY FOR GLUCOSE TESTING 100 each 0  . albuterol (PROVENTIL HFA;VENTOLIN HFA) 108 (90 Base) MCG/ACT inhaler Inhale 2 puffs into the lungs every 4 (four) hours as needed for wheezing or shortness of breath. 1 Inhaler 11  . chlorpheniramine-HYDROcodone (TUSSIONEX PENNKINETIC ER) 10-8 MG/5ML SUER Take 5 mLs by mouth every 12 (twelve) hours as needed. 120 mL 0  . cyclobenzaprine (FLEXERIL) 10 MG tablet Take 1 tablet (10 mg total) by mouth at bedtime. 30 tablet 3  . ergocalciferol (VITAMIN D2) 50000 units capsule Take 1 capsule (50,000 Units total) by mouth once a week. 12 capsule 3  . Fluocinolone Acetonide 0.01 % OIL Place 5 drops in ear(s) 2 (two) times daily as needed ("eczema in ears"). 20 mL 5  . glipiZIDE (GLUCOTROL) 5 MG tablet Take 1 tablet (5 mg total) by mouth 2 (two) times daily before a meal. 180 tablet 1  . ibuprofen (ADVIL,MOTRIN) 800 MG tablet Take 1 tablet (800 mg total) by mouth 3 (three) times daily as needed. 270 tablet 1  . ipratropium (  ATROVENT) 0.06 % nasal spray Place 2 sprays into both nostrils 3 (three) times daily as needed for rhinitis. Reported on 03/23/2015    . loratadine (CLARITIN) 10 MG tablet Take 1 tablet (10 mg total) by mouth daily. 30 tablet 11  . losartan-hydrochlorothiazide (HYZAAR) 100-12.5 MG tablet Take 1 tablet by mouth daily. 90 tablet 1  . metformin (FORTAMET) 1000 MG (OSM) 24 hr tablet TAKE 2 TABLETS (2,000 MG TOTAL) AT BEDTIME 180 tablet 3  . metoprolol (LOPRESSOR) 50 MG tablet Take 1 tablet (50 mg total) by mouth 2 (two) times daily. 180 tablet 1  . montelukast (SINGULAIR) 10 MG tablet Take 1 tablet (10 mg total) by mouth at bedtime. 90 tablet 3  .  naproxen (NAPROSYN) 500 MG tablet Take 1 tablet (500 mg total) by mouth 2 (two) times daily with a meal. 30 tablet 0  . pantoprazole (PROTONIX) 40 MG tablet TAKE 1 TABLET EVERY EVENING 90 tablet 3  . potassium chloride (K-DUR,KLOR-CON) 10 MEQ tablet Take 1 tablet (10 mEq total) by mouth every evening. 90 tablet 1  . ranitidine (ZANTAC) 150 MG tablet TAKE 1 TABLET TWICE A DAY 180 tablet 1  . traMADol (ULTRAM) 50 MG tablet Take 1 tablet (50 mg total) by mouth every 8 (eight) hours as needed. 270 tablet 1   No current facility-administered medications on file prior to visit.    Allergies  Allergen Reactions  . Glimepiride Other (See Comments)    Per patient causes severe gas pain  . Morphine And Related Nausea And Vomiting  . Colcrys [Colchicine] Rash  . Lisinopril Rash   Review of Systems  Constitutional: Positive for diaphoresis.  HENT: Positive for congestion.   Respiratory: Positive for wheezing.   Musculoskeletal: Positive for myalgias.  Skin: Positive for rash.      Objective:   Physical Exam  Constitutional: She is oriented to person, place, and time. She appears well-developed and well-nourished. No distress.  HENT:  Head: Normocephalic and atraumatic.  Right Ear: Tympanic membrane is injected and erythematous. A middle ear effusion is present.  Left Ear: Tympanic membrane is injected and erythematous. A middle ear effusion is present.  Mouth/Throat: Oropharynx is clear and moist and mucous membranes are normal.  Nares: Erythema and dry mucosa.   Eyes: Conjunctivae and EOM are normal.  Neck: Neck supple. No tracheal deviation present.  Cardiovascular: Normal rate, regular rhythm, S1 normal, S2 normal and normal heart sounds.   Pulmonary/Chest: Effort normal. No respiratory distress. She has rales.  Decreased air movement throughout. Bibasilar rales which clear.  Musculoskeletal: Normal range of motion.  Lymphadenopathy:    She has no cervical adenopathy.  Neurological:  She is alert and oriented to person, place, and time.  Skin: Skin is warm and dry.  Psychiatric: She has a normal mood and affect. Her behavior is normal.  Nursing note and vitals reviewed.  BP 130/82   Pulse 95   Temp 98.1 F (36.7 C) (Oral)   Resp 16   Ht 5' 4.75" (1.645 m)   Wt 246 lb 8 oz (111.8 kg)   SpO2 93%   BMI 41.34 kg/m     Results for orders placed or performed in visit on 07/13/16  POCT glycosylated hemoglobin (Hb A1C)  Result Value Ref Range   Hemoglobin A1C 8.1    Assessment & Plan:   1. Uncontrolled type 2 diabetes mellitus with hyperglycemia, without long-term current use of insulin ([place])   2. Postoperative hypothyroidism   3. Essential hypertension,  benign   4. Fatty infiltration of liver   5. Vitamin D deficiency   6. Chronic bilateral low back pain without sciatica     Orders Placed This Encounter  Procedures  . VITAMIN D 25 Hydroxy (Vit-D Deficiency, Fractures)  . TSH  . Comprehensive metabolic panel    Order Specific Question:   Has the patient fasted?    Answer:   Yes  . Microalbumin/Creatinine Ratio, Urine  . Lipid panel  . POCT glycosylated hemoglobin (Hb A1C)    Meds ordered this encounter  Medications  . DISCONTD: blood glucose meter kit and supplies KIT    Sig: Dispense based on patient and insurance preference. Use up to four times daily as directed. (FOR ICD-9 250.00, 250.01).    Dispense:  1 each    Refill:  0    Order Specific Question:   Number of strips    Answer:   1000    Order Specific Question:   Number of lancets    Answer:   1000  . traMADol (ULTRAM) 50 MG tablet    Sig: Take 1 tablet (50 mg total) by mouth every 8 (eight) hours as needed.    Dispense:  270 tablet    Refill:  1    This is 3 month supply with a refill for a total of 6 months of medication  . glipiZIDE (GLUCOTROL XL) 10 MG 24 hr tablet    Sig: Take 1 tablet (10 mg total) by mouth at bedtime.    Dispense:  90 tablet    Refill:  1  . DISCONTD:  metoprolol succinate (TOPROL-XL) 100 MG 24 hr tablet    Sig: Take 1 tablet (100 mg total) by mouth daily. Take with or immediately following a meal.    Dispense:  90 tablet    Refill:  3  . sodium chloride (OCEAN) 0.65 % SOLN nasal spray    Sig: Place 1 spray into both nostrils as needed for congestion.    Dispense:  1 Bottle    Refill:  0  . metoprolol succinate (TOPROL-XL) 100 MG 24 hr tablet    Sig: Take 1 tablet (100 mg total) by mouth daily. Take with or immediately following a meal.    Dispense:  90 tablet    Refill:  3    I personally performed the services described in this documentation, which was scribed in my presence. The recorded information has been reviewed and considered, and addended by me as needed.   [name], M.D.  Primary Care at [facility] [address] [place], [place] [address] [phone] phone [phone] fax  07/16/16 12:05 AM

## 2016-07-13 NOTE — Progress Notes (Signed)
Rx for diabetic supplies ( glucose meter, supplies kit) sent to [facility] delivery @ [phone] per Dr Brigitte Pulse.

## 2016-07-13 NOTE — Patient Instructions (Signed)
     IF you received an x-ray today, you will receive an invoice from [facility]. Please contact [facility] at [phone] with questions or concerns regarding your invoice.   IF you received labwork today, you will receive an invoice from [facility]. Please contact [facility] at [phone] with questions or concerns regarding your invoice.   Our billing staff will not be able to assist you with questions regarding bills from these companies.  You will be contacted with the lab results as soon as they are available. The fastest way to get your results is to activate your My Chart account. Instructions are located on the last page of this paperwork. If you have not heard from us regarding the results in 2 weeks, please contact this office.     

## 2016-07-14 LAB — MICROALBUMIN / CREATININE URINE RATIO
CREATININE, UR: 95.9 mg/dL
MICROALB/CREAT RATIO: 19 mg/g{creat} (ref 0.0–30.0)
MICROALBUM., U, RANDOM: 18.2 ug/mL

## 2016-07-15 ENCOUNTER — Other Ambulatory Visit: Payer: Self-pay | Admitting: Family Medicine

## 2016-07-15 ENCOUNTER — Other Ambulatory Visit: Payer: Self-pay | Admitting: Emergency Medicine

## 2016-07-15 LAB — COMPREHENSIVE METABOLIC PANEL
ALK PHOS: 82 IU/L (ref 39–117)
ALT: 30 IU/L (ref 0–32)
AST: 37 IU/L (ref 0–40)
Albumin/Globulin Ratio: 1.6 (ref 1.2–2.2)
Albumin: 4.2 g/dL (ref 3.5–5.5)
BUN/Creatinine Ratio: 23 (ref 9–23)
BUN: 15 mg/dL (ref 6–24)
Bilirubin Total: 0.2 mg/dL (ref 0.0–1.2)
CALCIUM: 9.5 mg/dL (ref 8.7–10.2)
CO2: 23 mmol/L (ref 20–29)
CREATININE: 0.66 mg/dL (ref 0.57–1.00)
Chloride: 101 mmol/L (ref 96–106)
GFR calc Af Amer: 112 mL/min/{1.73_m2} (ref >59)
GFR calc non Af Amer: 97 mL/min/{1.73_m2} (ref >59)
GLOBULIN, TOTAL: 2.7 g/dL (ref 1.5–4.5)
Glucose: 128 mg/dL — ABNORMAL HIGH (ref 65–99)
POTASSIUM: 4.2 mmol/L (ref 3.5–5.2)
SODIUM: 140 mmol/L (ref 134–144)
Total Protein: 6.9 g/dL (ref 6.0–8.5)

## 2016-07-15 LAB — LIPID PANEL
CHOL/HDL RATIO: 4.3 ratio (ref 0.0–4.4)
Cholesterol, Total: 142 mg/dL (ref 100–199)
HDL: 33 mg/dL — ABNORMAL LOW (ref >39)
LDL Calculated: 60 mg/dL (ref 0–99)
TRIGLYCERIDES: 245 mg/dL — AB (ref 0–149)
VLDL Cholesterol Cal: 49 mg/dL — ABNORMAL HIGH (ref 5–40)

## 2016-07-15 LAB — TSH: TSH: 0.725 u[IU]/mL (ref 0.450–4.500)

## 2016-07-15 LAB — VITAMIN D 25 HYDROXY (VIT D DEFICIENCY, FRACTURES): Vit D, 25-Hydroxy: 23.6 ng/mL — ABNORMAL LOW (ref 30.0–100.0)

## 2016-07-15 MED ORDER — BLOOD GLUCOSE MONITOR KIT: PACK | 0 refills | Status: DC

## 2016-07-15 NOTE — Telephone Encounter (Signed)
Diabetes kit printed instead of being faxed in Faxed to [facility] and left message advising

## 2016-07-15 NOTE — Telephone Encounter (Signed)
Pt stated that she called [facility] and they have not received blood glucose meter kit and supplies KIT [[id number]] yet. Read in notes that it was sent on sat 07/13/16 told pt that pt insisted on getting it sent again.  Please advise:

## 2016-07-16 ENCOUNTER — Telehealth: Payer: Self-pay

## 2016-07-16 NOTE — Telephone Encounter (Signed)
Order for blood glucose meter and supplies faxed to [facility].

## 2016-08-14 ENCOUNTER — Other Ambulatory Visit: Payer: Self-pay | Admitting: Family Medicine

## 2016-09-19 LAB — HM DIABETES EYE EXAM

## 2016-10-11 ENCOUNTER — Other Ambulatory Visit: Payer: Self-pay | Admitting: Family Medicine

## 2016-10-14 ENCOUNTER — Telehealth: Payer: Self-pay | Admitting: Family Medicine

## 2016-10-14 NOTE — Telephone Encounter (Signed)
[facility] CALLING TO FIND OUT IF PATIENT IS TAKING GLIPIZIDE 10 MG ER OR 5 MG PLAIN PLEASE CALL THEM AT [phone] WITH REFERENCE NUMBER [id number]

## 2016-10-15 NOTE — Telephone Encounter (Signed)
I called [facility] and talked with a Arvella Nigh,-  Which stated that their system was down and was unable to continue the conversation. He states that he will call our office back

## 2016-10-17 ENCOUNTER — Ambulatory Visit (INDEPENDENT_AMBULATORY_CARE_PROVIDER_SITE_OTHER): Payer: Managed Care, Other (non HMO) | Admitting: Family Medicine

## 2016-10-17 ENCOUNTER — Encounter: Payer: Self-pay | Admitting: Family Medicine

## 2016-10-17 VITALS — BP 135/77 | HR 65 | Temp 98.0°F | Resp 18 | Ht 64.75 in | Wt 246.6 lb

## 2016-10-17 DIAGNOSIS — I10 Essential (primary) hypertension: Secondary | ICD-10-CM

## 2016-10-17 DIAGNOSIS — Z1231 Encounter for screening mammogram for malignant neoplasm of breast: Secondary | ICD-10-CM

## 2016-10-17 DIAGNOSIS — Z1212 Encounter for screening for malignant neoplasm of rectum: Secondary | ICD-10-CM

## 2016-10-17 DIAGNOSIS — Z130 Encounter for screening for diseases of the blood and blood-forming organs and certain disorders involving the immune mechanism: Secondary | ICD-10-CM

## 2016-10-17 DIAGNOSIS — R21 Rash and other nonspecific skin eruption: Secondary | ICD-10-CM

## 2016-10-17 DIAGNOSIS — Z1383 Encounter for screening for respiratory disorder NEC: Secondary | ICD-10-CM

## 2016-10-17 DIAGNOSIS — Z1389 Encounter for screening for other disorder: Secondary | ICD-10-CM | POA: Diagnosis not present

## 2016-10-17 DIAGNOSIS — Z136 Encounter for screening for cardiovascular disorders: Secondary | ICD-10-CM

## 2016-10-17 DIAGNOSIS — E559 Vitamin D deficiency, unspecified: Secondary | ICD-10-CM | POA: Diagnosis not present

## 2016-10-17 DIAGNOSIS — Z23 Encounter for immunization: Secondary | ICD-10-CM | POA: Diagnosis not present

## 2016-10-17 DIAGNOSIS — Z0000 Encounter for general adult medical examination without abnormal findings: Secondary | ICD-10-CM

## 2016-10-17 DIAGNOSIS — K760 Fatty (change of) liver, not elsewhere classified: Secondary | ICD-10-CM

## 2016-10-17 DIAGNOSIS — E890 Postprocedural hypothyroidism: Secondary | ICD-10-CM

## 2016-10-17 DIAGNOSIS — Z1211 Encounter for screening for malignant neoplasm of colon: Secondary | ICD-10-CM

## 2016-10-17 DIAGNOSIS — E1165 Type 2 diabetes mellitus with hyperglycemia: Secondary | ICD-10-CM

## 2016-10-17 LAB — POCT URINALYSIS DIP (MANUAL ENTRY)
BILIRUBIN UA: NEGATIVE mg/dL
Bilirubin, UA: NEGATIVE
GLUCOSE UA: NEGATIVE mg/dL
Leukocytes, UA: NEGATIVE
Nitrite, UA: NEGATIVE
RBC UA: NEGATIVE
SPEC GRAV UA: 1.025 (ref 1.010–1.025)
UROBILINOGEN UA: 0.2 U/dL
pH, UA: 5.5 (ref 5.0–8.0)

## 2016-10-17 LAB — POCT SKIN KOH: Skin KOH, POC: NEGATIVE

## 2016-10-17 LAB — POCT GLYCOSYLATED HEMOGLOBIN (HGB A1C): HEMOGLOBIN A1C: 7.9

## 2016-10-17 MED ORDER — SITAGLIP PHOS-METFORMIN HCL ER 50-1000 MG PO TB24: 2.0000 | ORAL_TABLET | Freq: Every day | ORAL | 5 refills | Status: DC

## 2016-10-17 NOTE — Progress Notes (Signed)
Subjective:    Patient ID: Donna [name], female    DOB: 06-09-1955, 61 y.o.   MRN: [id number] Chief Complaint  Patient presents with  . Annual Exam    With PAP  . Medication Refill    Glipizide 5 MG    HPI  Last CPE 07/2015 Primary Preventative Screenings: Cervical Cancer:  STI screening: Declines needs - no libido Breast Cancer:  Mammogram cost her $300 last times - last was 10/10/15. Colorectal Cancer: doesn't have a driver to get this done. Tobacco use/EtOH/substances: Bone Density: none prior Cardiac: EKG 11/19/2013 Weight/Blood sugar/Diet/Exercise: OTC/Vit/Supp/Herbal: Calcium/vit D. Did try magnesium for cramps which helped but caused diarrhea Dentist/Optho: [place] Immunizations:  Immunization History  Administered Date(s) Administered  . Influenza,inj,Quad PF,6+ Mos 11/08/2015, 10/17/2016  . Influenza-Unspecified 10/28/2013  . Pneumococcal Polysaccharide-23 09/03/2013  . Tdap 09/03/2013     Cervical ca screen:   No h/o abnml pap with last on 08/2013 with neg HR HPV but did have grade 1 endometrial cancer found at that time so underwent a hysterectomy 10/2013 Mam: 02/2013 when she had to have a biopsy of her right breast CRS:  Immunizations: has flu shot annually at her job. Had TDaP and pneumovax 08/2013 EKG: done 10/2013 nml STI screening done 08/2013 with neg Hep C and HIV  Diet: Exercise: Optho: [facility] 09/19/16 Dentist: Vit/Supp/OTCs: h/o vit D def on once a week high dose replacement. Last vit D 11/2015 27 <- 20 <- 23 <- 22  sees derm prn  Chronic Medical Conditions: Muscle Cramps  States cramping is worse at night and in the summer. occasionally Flexeril at night.  Magnesium seem to help some but causes more diarrhea so can't take it all the time   She reports 2-3 normal BMs/day. She is taking Vitamin D every other day.   Diabetes She reprots that [facility] does not have strips that are compliant for her current glucometer. She  takes 2 Metformin tablets at night but states she forgets to take some of her medications; especially her weekend medications. She also states the her diet has been poor while she has been traveling.  Can't take the glipizide 10 - caused severe gas - so went back to 5 and gas is better - was causing severe pain.  Had to walk it out. Failed Invokana- could not tolerate urine side effects, Tradjenta - the ins wouldn't cover. Doesn't remember why she stopped [place] - thinks it was endocrine. She doesn't remember having any prob w/ it.   DMII: Diagnosed .   Lab Results  Component Value Date   HGBA1C 8.1 07/13/2016   HGBA1C 7.6 01/04/2016   HGBA1C 7.4 08/24/2015   CBGs: fasting a.m. ; after meal  ; No hypoglycemic episodes.  Meter type:  Diet:  Exercising:  DM Med Regimen: Prior changes:   eGFR:  Baseline Cr:  Last checked . Microalb: Done . Normal. On acei Lipids:  LDL ,  non-HDL .  Last levels done  - were improving from prior. On statin. Taking asa 81 qd.  Optho: Seen annually by - last exam  Feet: Monofilament exam done . Denies any no problems.  Not seen by podiatry prior.  Immunizations:  Influenza:  Pneumovax-23:  She recently had a granddaughter, Tharon Aquas - "hers", ~5 month ago.   Past Medical History:  Diagnosis Date  . Allergy   . Arthritis   . Diabetes mellitus without complication ([place])   . Difficulty sleeping   .  Diverticulitis   . Endometrial cancer ([place])   . GERD (gastroesophageal reflux disease)   . History of bronchitis   . History of gout   . Hypertension   . Hypothyroid   . Skin cancer (melanoma) ([place])    to be removed 12/09/13   Past Surgical History:  Procedure Laterality Date  . CHOLECYSTECTOMY    . ROBOTIC ASSISTED TOTAL HYSTERECTOMY WITH BILATERAL SALPINGO OOPHERECTOMY Bilateral 11/23/2013   Procedure: ROBOTIC ASSISTED TOTAL HYSTERECTOMY WITH BILATERAL SALPINGO OOPHORECTOMY, LYMPH NODE DISSECTION ,LYSIS OF ADHESIONS,PELVIC WASHINGS ;  Surgeon:  Everitt Amber, MD;  Location: [facility];  Service: Gynecology;  Laterality: Bilateral;  . skin cancer removal     melanoma removed from back   . SUTURE REMOVAL N/A 11/23/2013   Procedure: SUTURE REMOVAL;  Surgeon: Everitt Amber, MD;  Location: [facility];  Service: Gynecology;  Laterality: N/A;  . THYROIDECTOMY     Current Outpatient Prescriptions on File Prior to Visit  Medication Sig Dispense Refill  . albuterol (PROVENTIL HFA;VENTOLIN HFA) 108 (90 Base) MCG/ACT inhaler Inhale 2 puffs into the lungs every 4 (four) hours as needed for wheezing or shortness of breath. 1 Inhaler 11  . blood glucose meter kit and supplies KIT Dispense based on patient and insurance preference. Use up to four times daily as directed. (FOR ICD-9 250.00, 250.01). 1 each 0  . cyclobenzaprine (FLEXERIL) 10 MG tablet Take 1 tablet (10 mg total) by mouth at bedtime. 30 tablet 3  . ergocalciferol (VITAMIN D2) 50000 units capsule Take 1 capsule (50,000 Units total) by mouth once a week. 12 capsule 3  . Fluocinolone Acetonide 0.01 % OIL Place 5 drops in ear(s) 2 (two) times daily as needed ("eczema in ears"). 20 mL 5  . glipiZIDE (GLUCOTROL XL) 10 MG 24 hr tablet Take 1 tablet (10 mg total) by mouth at bedtime. 90 tablet 1  . ibuprofen (ADVIL,MOTRIN) 800 MG tablet Take 1 tablet (800 mg total) by mouth 3 (three) times daily as needed. 270 tablet 1  . ipratropium (ATROVENT) 0.06 % nasal spray Place 2 sprays into both nostrils 3 (three) times daily as needed for rhinitis. Reported on 03/23/2015    . loratadine (CLARITIN) 10 MG tablet Take 1 tablet (10 mg total) by mouth daily. 30 tablet 11  . losartan-hydrochlorothiazide (HYZAAR) 100-12.5 MG tablet TAKE 1 TABLET DAILY 90 tablet 0  . metformin (FORTAMET) 1000 MG (OSM) 24 hr tablet TAKE 2 TABLETS (2,000 MG TOTAL) AT BEDTIME 180 tablet 3  . metoprolol succinate (TOPROL-XL) 100 MG 24 hr tablet Take 1 tablet (100 mg total) by mouth daily. Take with or immediately following a meal. 90 tablet 3  .  montelukast (SINGULAIR) 10 MG tablet Take 1 tablet (10 mg total) by mouth at bedtime. 90 tablet 3  . pantoprazole (PROTONIX) 40 MG tablet TAKE 1 TABLET EVERY EVENING 90 tablet 3  . potassium chloride (K-DUR,KLOR-CON) 10 MEQ tablet Take 1 tablet (10 mEq total) by mouth every evening. 90 tablet 1  . ranitidine (ZANTAC) 150 MG tablet TAKE 1 TABLET TWICE A DAY 180 tablet 1  . sodium chloride (OCEAN) 0.65 % SOLN nasal spray Place 1 spray into both nostrils as needed for congestion. 1 Bottle 0  . traMADol (ULTRAM) 50 MG tablet Take 1 tablet (50 mg total) by mouth every 8 (eight) hours as needed. 270 tablet 1  . naproxen (NAPROSYN) 500 MG tablet Take 1 tablet (500 mg total) by mouth 2 (two) times daily with a meal. (Patient not taking:  Reported on 10/17/2016) 30 tablet 0   No current facility-administered medications on file prior to visit.    Allergies  Allergen Reactions  . Glimepiride Other (See Comments)    Per patient causes severe gas pain  . Morphine And Related Nausea And Vomiting  . Colcrys [Colchicine] Rash  . Lisinopril Rash   Family History  Problem Relation Age of Onset  . Stroke Mother   . Heart disease Mother   . Cancer Father   . Diabetes Father   . Cancer Brother   . Diabetes Paternal Uncle    Social History   Social History  . Marital status: Single    Spouse name: N/A  . Number of children: N/A  . Years of education: N/A   Social History Main Topics  . Smoking status: Former Smoker    Quit date: 11/20/2007  . Smokeless tobacco: Never Used  . Alcohol use No     Comment: occasional  . Drug use: No  . Sexual activity: No   Other Topics Concern  . None   Social History Narrative  . None   Depression screen [facility] 2/9 10/17/2016 07/13/2016 01/04/2016 12/06/2015 08/24/2015  Decreased Interest 0 0 0 0 0  Down, Depressed, Hopeless 0 0 0 0 0  PHQ - 2 Score 0 0 0 0 0    Review of Systems See hpi    Objective:   Physical Exam  Constitutional: She is oriented to  person, place, and time. She appears well-developed and well-nourished. No distress.  HENT:  Head: Normocephalic and atraumatic.  Right Ear: Tympanic membrane, external ear and ear canal normal.  Left Ear: Tympanic membrane, external ear and ear canal normal.  Nose: Nose normal. No mucosal edema or rhinorrhea.  Mouth/Throat: Uvula is midline, oropharynx is clear and moist and mucous membranes are normal. No posterior oropharyngeal erythema.  Eyes: Pupils are equal, round, and reactive to light. Conjunctivae and EOM are normal. Right eye exhibits no discharge. Left eye exhibits no discharge. No scleral icterus.  Neck: Normal range of motion. Neck supple. No thyromegaly present.  Cardiovascular: Normal rate, regular rhythm, normal heart sounds and intact distal pulses.   Pulmonary/Chest: Effort normal and breath sounds normal. No respiratory distress.  Abdominal: Soft. Bowel sounds are normal. There is no tenderness.  Genitourinary: No breast swelling, tenderness, discharge or bleeding.  Musculoskeletal: She exhibits no edema.  Lymphadenopathy:    She has no cervical adenopathy.    She has no axillary adenopathy.  Neurological: She is alert and oriented to person, place, and time. She has normal reflexes.  Skin: Skin is warm and dry. She is not diaphoretic. No erythema.  Psychiatric: She has a normal mood and affect. Her behavior is normal.      BP 135/77 (BP Location: Right Arm, Patient Position: Sitting, Cuff Size: Large)   Pulse 65   Temp 98 F (36.7 C) (Oral)   Resp 18   Ht 5' 4.75" (1.645 m)   Wt 246 lb 9.6 oz (111.9 kg)   SpO2 95%   BMI 41.35 kg/m      Results for orders placed or performed in visit on 10/17/16  POCT urinalysis dipstick  Result Value Ref Range   Color, UA yellow yellow   Clarity, UA clear clear   Glucose, UA negative negative mg/dL   Bilirubin, UA negative negative   Ketones, POC UA negative negative mg/dL   Spec Grav, UA 1.025 1.010 - 1.025   Blood,  UA negative negative  pH, UA 5.5 5.0 - 8.0   Protein Ur, POC trace (A) negative mg/dL   Urobilinogen, UA 0.2 0.2 or 1.0 E.U./dL   Nitrite, UA Negative Negative   Leukocytes, UA Negative Negative  POCT glycosylated hemoglobin (Hb A1C)  Result Value Ref Range   Hemoglobin A1C 7.9     Assessment & Plan:  Will do pap next year or by 2020 and then again at 41 and no more if still normal/neg as did have hysterectomy.  1. Annual physical exam   2. Flu vaccine need   3. Encounter for screening mammogram for breast cancer   4. Screening for cardiovascular, respiratory, and genitourinary diseases   5. Screening for colorectal cancer   6. Screening for deficiency anemia   7. Uncontrolled type 2 diabetes mellitus with hyperglycemia, without long-term current use of insulin (HCC) - cont metformin XR 2g/d and glipizide 5 - could not tolerate increase to 10 - retry Januvia since she thinks she only went off before due to cost issues. Gave coupon.  8. 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 Fatty infiltration of liver   10. Essential hypertension, benign - stable, cont losartan-hctz 100-12.5  11. Vitamin D deficiency - still low - will have her doulbe her supp from once to twice a week. Likely she is forgetting it a lot so maybe she will have twice the chance of taking it now. Do not extend rx for longer than 6 mos before recheck due to high dose  12. Rash and nonspecific skin eruption    Refill any of current meds on list x 6 mos whenever requested except tramadol for 3 mos supply (#270.) [facility] except [facility] things needed im[facility]. [facility]'t [facility] so will need to take the [facility]et to her local [facility] - thinks maybe that is why she went off the [place] prior - due to ins issues about [facility].  Orders Placed This Encounter  Procedures  . Flu Vaccine QUAD 36+ mos IM  . Lipid  panel    Order Specific Question:   Has the patient fasted?    Answer:   Yes  . Comprehensive metabolic panel    Order Specific Question:   Has the patient fasted?    Answer:   Yes  . TSH  . VITAMIN D 25 Hydroxy (Vit-D Deficiency, Fractures)  . CBC with Differential/Platelet  . Ambulatory referral to Gastroenterology    Referral Priority:   Routine    Referral Type:   Consultation    Referral Reason:   Specialty Services Required    Number of Visits Requested:   1  . POCT urinalysis dipstick  . POCT glycosylated hemoglobin (Hb A1C)  . POCT Skin KOH    Meds ordered this encounter  Medications  . SitaGLIPtin-MetFORMIN HCl (JANUMET XR) 50-1000 MG TB24    Sig: Take 2 tablets by mouth daily.    Dispense:  60 tablet    Refill:  5  . losartan-hydrochlorothiazide (HYZAAR) 100-12.5 MG tablet    Sig: Take 1 tablet by mouth daily.    Dispense:  90 tablet    Refill:  3    \ [name], M.D.  Primary Care at [facility] [address] [place], [place] [address] [phone] phone [phone] fax  10/20/16 7:56 AM

## 2016-10-17 NOTE — Patient Instructions (Addendum)
IF you received an x-ray today, you will receive an invoice from [facility]. Please contact [facility] at [phone] with questions or concerns regarding your invoice.   IF you received labwork today, you will receive an invoice from [place]. Please contact [facility] at [phone] with questions or concerns regarding your invoice.   Our billing staff will not be able to assist you with questions regarding bills from these companies.  You will be contacted with the lab results as soon as they are available. The fastest way to get your results is to activate your My Chart account. Instructions are located on the last page of this paperwork. If you have not heard from [place] regarding the results in 2 weeks, please contact this office.     Health Maintenance for Postmenopausal Women Menopause is a normal process in which your reproductive ability comes to an end. This process happens gradually over a span of months to years, usually between the ages of 13 and 39. Menopause is complete when you have missed 12 consecutive menstrual periods. It is important to talk with your health care provider about some of the most common conditions that affect postmenopausal women, such as heart disease, cancer, and bone loss (osteoporosis). Adopting a healthy lifestyle and getting preventive care can help to promote your health and wellness. Those actions can also lower your chances of developing some of these common conditions. What should I know about menopause? During menopause, you may experience a number of symptoms, such as:  Moderate-to-severe hot flashes.  Night sweats.  Decrease in sex drive.  Mood swings.  Headaches.  Tiredness.  Irritability.  Memory problems.  Insomnia.  Choosing to treat or not to treat menopausal changes is an individual decision that you make with your health care provider. What should I know about hormone replacement therapy and  supplements? Hormone therapy products are effective for treating symptoms that are associated with menopause, such as hot flashes and night sweats. Hormone replacement carries certain risks, especially as you become older. If you are thinking about using estrogen or estrogen with progestin treatments, discuss the benefits and risks with your health care provider. What should I know about heart disease and stroke? Heart disease, heart attack, and stroke become more likely as you age. This may be due, in part, to the hormonal changes that your body experiences during menopause. These can affect how your body processes dietary fats, triglycerides, and cholesterol. Heart attack and stroke are both medical emergencies. There are many things that you can do to help prevent heart disease and stroke:  Have your blood pressure checked at least every 1-2 years. High blood pressure causes heart disease and increases the risk of stroke.  If you are 75-98 years old, ask your health care provider if you should take aspirin to prevent a heart attack or a stroke.  Do not use any tobacco products, including cigarettes, chewing tobacco, or electronic cigarettes. If you need help quitting, ask your health care provider.  It is important to eat a healthy diet and maintain a healthy weight. ? Be sure to include plenty of vegetables, fruits, low-fat dairy products, and lean protein. ? Avoid eating foods that are high in solid fats, added sugars, or salt (sodium).  Get regular exercise. This is one of the most important things that you can do for your health. ? Try to exercise for at least 150 minutes each week. The type of exercise that you do should increase your  heart rate and make you sweat. This is known as moderate-intensity exercise. ? Try to do strengthening exercises at least twice each week. Do these in addition to the moderate-intensity exercise.  Know your numbers.Ask your health care provider to check  your cholesterol and your blood glucose. Continue to have your blood tested as directed by your health care provider.  What should I know about cancer screening? There are several types of cancer. Take the following steps to reduce your risk and to catch any cancer development as early as possible. Breast Cancer  Practice breast self-awareness. ? This means understanding how your breasts normally appear and feel. ? It also means doing regular breast self-exams. Let your health care provider know about any changes, no matter how small.  If you are 40 or older, have a clinician do a breast exam (clinical breast exam or CBE) every year. Depending on your age, family history, and medical history, it may be recommended that you also have a yearly breast X-ray (mammogram).  If you have a family history of breast cancer, talk with your health care provider about genetic screening.  If you are at high risk for breast cancer, talk with your health care provider about having an MRI and a mammogram every year.  Breast cancer (BRCA) gene test is recommended for women who have family members with BRCA-related cancers. Results of the assessment will determine the need for genetic counseling and BRCA1 and for BRCA2 testing. BRCA-related cancers include these types: ? Breast. This occurs in males or females. ? Ovarian. ? Tubal. This may also be called fallopian tube cancer. ? Cancer of the abdominal or pelvic lining (peritoneal cancer). ? Prostate. ? Pancreatic.  Cervical, Uterine, and Ovarian Cancer Your health care provider may recommend that you be screened regularly for cancer of the pelvic organs. These include your ovaries, uterus, and vagina. This screening involves a pelvic exam, which includes checking for microscopic changes to the surface of your cervix (Pap test).  For women ages 21-65, health care providers may recommend a pelvic exam and a Pap test every three years. For women ages 30-65,  they may recommend the Pap test and pelvic exam, combined with testing for human papilloma virus (HPV), every five years. Some types of HPV increase your risk of cervical cancer. Testing for HPV may also be done on women of any age who have unclear Pap test results.  Other health care providers may not recommend any screening for nonpregnant women who are considered low risk for pelvic cancer and have no symptoms. Ask your health care provider if a screening pelvic exam is right for you.  If you have had past treatment for cervical cancer or a condition that could lead to cancer, you need Pap tests and screening for cancer for at least 20 years after your treatment. If Pap tests have been discontinued for you, your risk factors (such as having a new sexual partner) need to be reassessed to determine if you should start having screenings again. Some women have medical problems that increase the chance of getting cervical cancer. In these cases, your health care provider may recommend that you have screening and Pap tests more often.  If you have a family history of uterine cancer or ovarian cancer, talk with your health care provider about genetic screening.  If you have vaginal bleeding after reaching menopause, tell your health care provider.  There are currently no reliable tests available to screen for ovarian cancer.  Lung   Cancer Lung cancer screening is recommended for adults 55-80 years old who are at high risk for lung cancer because of a history of smoking. A yearly low-dose CT scan of the lungs is recommended if you:  Currently smoke.  Have a history of at least 30 pack-years of smoking and you currently smoke or have quit within the past 15 years. A pack-year is smoking an average of one pack of cigarettes per day for one year.  Yearly screening should:  Continue until it has been 15 years since you quit.  Stop if you develop a health problem that would prevent you from having lung  cancer treatment.  Colorectal Cancer  This type of cancer can be detected and can often be prevented.  Routine colorectal cancer screening usually begins at age 50 and continues through age 75.  If you have risk factors for colon cancer, your health care provider may recommend that you be screened at an earlier age.  If you have a family history of colorectal cancer, talk with your health care provider about genetic screening.  Your health care provider may also recommend using home test kits to check for hidden blood in your stool.  A small camera at the end of a tube can be used to examine your colon directly (sigmoidoscopy or colonoscopy). This is done to check for the earliest forms of colorectal cancer.  Direct examination of the colon should be repeated every 5-10 years until age 75. However, if early forms of precancerous polyps or small growths are found or if you have a family history or genetic risk for colorectal cancer, you may need to be screened more often.  Skin Cancer  Check your skin from head to toe regularly.  Monitor any moles. Be sure to tell your health care provider: ? About any new moles or changes in moles, especially if there is a change in a mole's shape or color. ? If you have a mole that is larger than the size of a pencil eraser.  If any of your family members has a history of skin cancer, especially at a young age, talk with your health care provider about genetic screening.  Always use sunscreen. Apply sunscreen liberally and repeatedly throughout the day.  Whenever you are outside, protect yourself by wearing long sleeves, pants, a wide-brimmed hat, and sunglasses.  What should I know about osteoporosis? Osteoporosis is a condition in which bone destruction happens more quickly than new bone creation. After menopause, you may be at an increased risk for osteoporosis. To help prevent osteoporosis or the bone fractures that can happen because of  osteoporosis, the following is recommended:  If you are 19-50 years old, get at least 1,000 mg of calcium and at least 600 mg of vitamin D per day.  If you are older than age 50 but younger than age 70, get at least 1,200 mg of calcium and at least 600 mg of vitamin D per day.  If you are older than age 70, get at least 1,200 mg of calcium and at least 800 mg of vitamin D per day.  Smoking and excessive alcohol intake increase the risk of osteoporosis. Eat foods that are rich in calcium and vitamin D, and do weight-bearing exercises several times each week as directed by your health care provider. What should I know about how menopause affects my mental health? Depression may occur at any age, but it is more common as you become older. Common symptoms of depression   include:  Low or sad mood.  Changes in sleep patterns.  Changes in appetite or eating patterns.  Feeling an overall lack of motivation or enjoyment of activities that you previously enjoyed.  Frequent crying spells.  Talk with your health care provider if you think that you are experiencing depression. What should I know about immunizations? It is important that you get and maintain your immunizations. These include:  Tetanus, diphtheria, and pertussis (Tdap) booster vaccine.  Influenza every year before the flu season begins.  Pneumonia vaccine.  Shingles vaccine.  Your health care provider may also recommend other immunizations. This information is not intended to replace advice given to you by your health care provider. Make sure you discuss any questions you have with your health care provider. Document Released: 03/08/2005 Document Revised: 08/04/2015 Document Reviewed: 10/18/2014 Elsevier Interactive Patient Education  2018 [place].  Calcium Intake Recommendations Calcium is a mineral that affects many functions in the body, including:  Blood clotting.  Blood vessel function.  Nerve impulse  conduction.  Hormone secretion.  Muscle contraction.  Bone and teeth functions.  Most of your body's calcium supply is stored in your bones and teeth. When your calcium stores are low, you may be at risk for low bone mass, bone loss, and bone fractures. Consuming enough calcium helps to grow healthy bones and teeth and to prevent breakdown over time. It is very important that you get enough calcium if you are:  A child undergoing rapid growth.  An adolescent girl.  A pre- or post-menopausal woman.  A woman whose menstrual cycle has stopped due to anorexia nervosa or regular intense exercise.  An individual with lactose intolerance or a milk allergy.  A vegetarian.  What is my plan? Try to consume the recommended amount of calcium daily based on your age. Depending on your overall health, your health care provider may recommend increased calcium intake.General daily calcium intake recommendations by age are:  Birth to 6 months: 200 mg.  Infants 7 to 12 months: 260 mg.  Children 1 to 3 years: 700 mg.  Children 4 to 8 years: 1,000 mg.  Children 9 to 13 years: 1,300 mg.  Teens 14 to 18 years: 1,300 mg.  Adults 19 to 50 years: 1,000 mg.  Adult women 51 to 70 years: 1,200 mg.  Adult men 51 to 70 years: 1,000 mg.  Adults 71 years and older: 1,200 mg.  Pregnant and breastfeeding teens: 1,300 mg.  Pregnant and breastfeeding adults: 1,000 mg.  What do I need to know about calcium intake?  In order for the body to absorb calcium, it needs vitamin D. You can get vitamin D through: ? Direct exposure of the skin to sunlight. ? Foods, such as egg yolks, liver, saltwater fish, and fortified milk. ? Supplements.  Consuming too much calcium may cause: ? Constipation. ? Decreased absorption of iron and zinc. ? Kidney stones.  Calcium supplements may interact with certain medicines. Check with your health care provider before starting any calcium supplements.  Try to get  most of your calcium from food. What foods can I eat? Grains  Fortified oatmeal. Fortified ready-to-eat cereals. Fortified frozen waffles. Vegetables Turnip greens. Broccoli. Fruits Fortified orange juice. Meats and Other Protein Sources Canned sardines with bones. Canned salmon with bones. Soy beans. Tofu. Baked beans. Almonds. [place] nuts. Sunflower seeds. Dairy Milk. Yogurt. Cheese. Cottage cheese. Beverages Fortified soy milk. Fortified rice milk. Sweets/Desserts Pudding. Ice Cream. Milkshakes. Blackstrap molasses. The items listed above may  not be a complete list of recommended foods or beverages. Contact your dietitian for more options. What foods can affect my calcium intake? It may be more difficult for your body to use calcium or calcium may leave your body more quickly if you consume large amounts of:  Sodium.  Protein.  Caffeine.  Alcohol.  This information is not intended to replace advice given to you by your health care provider. Make sure you discuss any questions you have with your health care provider. Document Released: 08/29/2003 Document Revised: 08/04/2015 Document Reviewed: 06/22/2013 Elsevier Interactive Patient Education  2018 [place] protect organs, store calcium, and anchor muscles. Good health habits, such as eating nutritious foods and exercising regularly, are important for maintaining healthy bones. They can also help to prevent a condition that causes bones to lose density and become weak and brittle (osteoporosis). Why is bone mass important? Bone mass refers to the amount of bone tissue that you have. The higher your bone mass, the stronger your bones. An important step toward having healthy bones throughout life is to have strong and dense bones during childhood. A young adult who has a high bone mass is more likely to have a high bone mass later in life. Bone mass at its greatest it is called peak bone mass. A large  decline in bone mass occurs in older adults. In women, it occurs about the time of menopause. During this time, it is important to practice good health habits, because if more bone is lost than what is replaced, the bones will become less healthy and more likely to break (fracture). If you find that you have a low bone mass, you may be able to prevent osteoporosis or further bone loss by changing your diet and lifestyle. How can I find out if my bone mass is low? Bone mass can be measured with an X-ray test that is called a bone mineral density (BMD) test. This test is recommended for all women who are age 732 or older. It may also be recommended for men who are age 64 or older, or for people who are more likely to develop osteoporosis due to:  Having bones that break easily.  Having a long-term disease that weakens bones, such as kidney disease or rheumatoid arthritis.  Having menopause earlier than normal.  Taking medicine that weakens bones, such as steroids, thyroid hormones, or hormone treatment for breast cancer or prostate cancer.  Smoking.  Drinking three or more alcoholic drinks each day.  What are the nutritional recommendations for healthy bones? To have healthy bones, you need to get enough of the right minerals and vitamins. Most nutrition experts recommend getting these nutrients from the foods that you eat. Nutritional recommendations vary from person to person. Ask your health care provider what is healthy for you. Here are some general guidelines. Calcium Recommendations Calcium is the most important (essential) mineral for bone health. Most people can get enough calcium from their diet, but supplements may be recommended for people who are at risk for osteoporosis. Good sources of calcium include:  Dairy products, such as low-fat or nonfat milk, cheese, and yogurt.  Dark green leafy vegetables, such as bok choy and broccoli.  Calcium-fortified foods, such as orange juice,  cereal, bread, soy beverages, and tofu products.  Nuts, such as almonds.  Follow these recommended amounts for daily calcium intake:  Children, age 29?3: 700 mg.  Children, age 73?8: 1,000 mg.  Children, age 61?13: 1,300 mg.  Teens, age 5?18: 1,300 mg.  Adults, age 31?50: 1,000 mg.  Adults, age 51?70: ? Men: 1,000 mg. ? Women: 1,200 mg.  Adults, age 17 or older: 1,200 mg.  Pregnant and breastfeeding females: ? Teens: 1,300 mg. ? Adults: 1,000 mg.  Vitamin D Recommendations Vitamin D is the most essential vitamin for bone health. It helps the body to absorb calcium. Sunlight stimulates the skin to make vitamin D, so be sure to get enough sunlight. If you live in a cold climate or you do not get outside often, your health care provider may recommend that you take vitamin D supplements. Good sources of vitamin D in your diet include:  Egg yolks.  Saltwater fish.  Milk and cereal fortified with vitamin D.  Follow these recommended amounts for daily vitamin D intake:  Children and teens, age 3?18: 80 international units.  Adults, age 22 or younger: 400-800 international units.  Adults, age 3 or older: 800-1,000 international units.  Other Nutrients Other nutrients for bone health include:  Phosphorus. This mineral is found in meat, poultry, dairy foods, nuts, and legumes. The recommended daily intake for adult men and adult women is 700 mg.  Magnesium. This mineral is found in seeds, nuts, dark green vegetables, and legumes. The recommended daily intake for adult men is 400?420 mg. For adult women, it is 310?320 mg.  Vitamin K. This vitamin is found in green leafy vegetables. The recommended daily intake is 120 mg for adult men and 90 mg for adult women.  What type of physical activity is best for building and maintaining healthy bones? Weight-bearing and strength-building activities are important for building and maintaining peak bone mass. Weight-bearing activities  cause muscles and bones to work against gravity. Strength-building activities increases muscle strength that supports bones. Weight-bearing and muscle-building activities include:  Walking and hiking.  Jogging and running.  Dancing.  Gym exercises.  Lifting weights.  Tennis and racquetball.  Climbing stairs.  Aerobics.  Adults should get at least 30 minutes of moderate physical activity on most days. Children should get at least 60 minutes of moderate physical activity on most days. Ask your health care provide what type of exercise is best for you. Where can I find more information? For more information, check out the following websites:  [place]: [employer]se  [employer] of Health: http://www.niams.AnonymousEar.fr.asp  This information is not intended to replace advice given to you by your health care provider. Make sure you discuss any questions you have with your health care provider. Document Released: 04/06/2003 Document Revised: 08/04/2015 Document Reviewed: 01/19/2014 Elsevier Interactive Patient Education  [employer].

## 2016-10-18 LAB — COMPREHENSIVE METABOLIC PANEL
A/G RATIO: 1.4 (ref 1.2–2.2)
ALBUMIN: 4.3 g/dL (ref 3.6–4.8)
ALT: 32 IU/L (ref 0–32)
AST: 42 IU/L — ABNORMAL HIGH (ref 0–40)
Alkaline Phosphatase: 82 IU/L (ref 39–117)
BUN/Creatinine Ratio: 26 (ref 12–28)
BUN: 15 mg/dL (ref 8–27)
Bilirubin Total: 0.3 mg/dL (ref 0.0–1.2)
CALCIUM: 9.1 mg/dL (ref 8.7–10.3)
CO2: 24 mmol/L (ref 20–29)
CREATININE: 0.58 mg/dL (ref 0.57–1.00)
Chloride: 100 mmol/L (ref 96–106)
GFR, EST AFRICAN AMERICAN: 116 mL/min/{1.73_m2} (ref >59)
GFR, EST NON AFRICAN AMERICAN: 101 mL/min/{1.73_m2} (ref >59)
Globulin, Total: 3.1 g/dL (ref 1.5–4.5)
Glucose: 163 mg/dL — ABNORMAL HIGH (ref 65–99)
POTASSIUM: 4.5 mmol/L (ref 3.5–5.2)
SODIUM: 141 mmol/L (ref 134–144)
Total Protein: 7.4 g/dL (ref 6.0–8.5)

## 2016-10-18 LAB — CBC WITH DIFFERENTIAL/PLATELET
BASOS ABS: 0.1 10*3/uL (ref 0.0–0.2)
BASOS: 1 %
EOS (ABSOLUTE): 0.4 10*3/uL (ref 0.0–0.4)
EOS: 4 %
HEMATOCRIT: 41.9 % (ref 34.0–46.6)
HEMOGLOBIN: 13.8 g/dL (ref 11.1–15.9)
Immature Grans (Abs): 0.1 10*3/uL (ref 0.0–0.1)
Immature Granulocytes: 1 %
LYMPHS ABS: 3 10*3/uL (ref 0.7–3.1)
Lymphs: 33 %
MCH: 28.6 pg (ref 26.6–33.0)
MCHC: 32.9 g/dL (ref 31.5–35.7)
MCV: 87 fL (ref 79–97)
MONOCYTES: 6 %
Monocytes Absolute: 0.5 10*3/uL (ref 0.1–0.9)
NEUTROS ABS: 5 10*3/uL (ref 1.4–7.0)
Neutrophils: 55 %
Platelets: 354 10*3/uL (ref 150–379)
RBC: 4.83 x10E6/uL (ref 3.77–5.28)
RDW: 13.8 % (ref 12.3–15.4)
WBC: 9 10*3/uL (ref 3.4–10.8)

## 2016-10-18 LAB — LIPID PANEL
CHOL/HDL RATIO: 4.7 ratio — AB (ref 0.0–4.4)
CHOLESTEROL TOTAL: 154 mg/dL (ref 100–199)
HDL: 33 mg/dL — AB (ref >39)
LDL Calculated: 77 mg/dL (ref 0–99)
TRIGLYCERIDES: 218 mg/dL — AB (ref 0–149)
VLDL Cholesterol Cal: 44 mg/dL — ABNORMAL HIGH (ref 5–40)

## 2016-10-18 LAB — VITAMIN D 25 HYDROXY (VIT D DEFICIENCY, FRACTURES): VIT D 25 HYDROXY: 23.3 ng/mL — AB (ref 30.0–100.0)

## 2016-10-18 LAB — TSH: TSH: 1.34 u[IU]/mL (ref 0.450–4.500)

## 2016-10-20 MED ORDER — ERGOCALCIFEROL 1.25 MG (50000 UT) PO CAPS: 50000.0000 [IU] | ORAL_CAPSULE | ORAL | 1 refills | Status: DC

## 2016-10-20 MED ORDER — LOSARTAN POTASSIUM-HCTZ 100-12.5 MG PO TABS: 1.0000 | ORAL_TABLET | Freq: Every day | ORAL | 3 refills | Status: DC

## 2016-11-18 ENCOUNTER — Encounter: Payer: Self-pay | Admitting: Family Medicine

## 2016-11-25 ENCOUNTER — Other Ambulatory Visit: Payer: Self-pay | Admitting: Family Medicine

## 2017-01-16 ENCOUNTER — Encounter: Payer: Self-pay | Admitting: Family Medicine

## 2017-01-16 ENCOUNTER — Ambulatory Visit: Payer: Managed Care, Other (non HMO) | Admitting: Family Medicine

## 2017-01-16 ENCOUNTER — Other Ambulatory Visit: Payer: Self-pay

## 2017-01-16 VITALS — BP 126/82 | HR 86 | Temp 98.4°F | Resp 16 | Ht 64.75 in | Wt 244.8 lb

## 2017-01-16 DIAGNOSIS — E559 Vitamin D deficiency, unspecified: Secondary | ICD-10-CM | POA: Diagnosis not present

## 2017-01-16 DIAGNOSIS — E1165 Type 2 diabetes mellitus with hyperglycemia: Secondary | ICD-10-CM | POA: Diagnosis not present

## 2017-01-16 LAB — POCT GLYCOSYLATED HEMOGLOBIN (HGB A1C): HEMOGLOBIN A1C: 8.3

## 2017-01-16 MED ORDER — EXENATIDE ER 2 MG/0.85ML ~~LOC~~ AUIJ: 2.0000 mg | AUTO-INJECTOR | SUBCUTANEOUS | 5 refills | Status: DC

## 2017-01-16 MED ORDER — METAXALONE 800 MG PO TABS: 800.0000 mg | ORAL_TABLET | Freq: Three times a day (TID) | ORAL | 3 refills | Status: DC

## 2017-01-16 MED ORDER — METFORMIN HCL ER (OSM) 1000 MG PO TB24: ORAL_TABLET | ORAL | 3 refills | Status: DC

## 2017-01-16 MED ORDER — TRAMADOL HCL 50 MG PO TABS: 50.0000 mg | ORAL_TABLET | Freq: Four times a day (QID) | ORAL | 1 refills | Status: DC | PRN

## 2017-01-16 NOTE — Progress Notes (Addendum)
Subjective:    Patient ID: Donna [name], female    DOB: [birth year], 61 y.o.   MRN: [id number] Chief Complaint  Patient presents with  . Follow-up    TIIDM    HPI Muscle Cramps  States cramping is worse at night and in the summer. occasionally Flexeril at night.  Magnesium seem to help some but causes more diarrhea so can't take it all the time. Worse in the bad weather.   Has pain in her above erhright clavilce and swelling.   She reports 2-3 normal BMs/day. She is taking Vitamin D every other day.   Diabetes She reprots that [facility] does not have strips that are compliant for her current glucometer. She takes 2 Metformin tablets at night but states she forgets to take some of her medications; especially her weekend medications. She also states the her diet has been poor while she has been traveling.  Can't take the glipizide 10 - caused severe gas - so went back to 5 and gas is better - was causing severe pain.  Had to walk it out. Failed Invokana- could not tolerate urine side effects, Tradjenta - the ins wouldn't cover. Doesn't remember why she stopped [place] - thinks it was endocrine. She doesn't remember having any prob w/ it.   DMII: Diagnosed .   RecentLabs       Lab Results  Component Value Date   HGBA1C 8.1 07/13/2016   HGBA1C 7.6 01/04/2016   HGBA1C 7.4 08/24/2015     CBGs: fasting a.m. 180-200 ; after meal 130 or higher ; No hypoglycemic episodes.             Meter type:  Diet:  Exercising:  DM Med Regimen:  On 9/20 started Januvia - stomach doing better with less bloating Prior changes:   eGFR:  Baseline Cr:  Last checked . Microalb: Done . Normal. On acei Lipids:  LDL ,  non-HDL .  Last levels done  - were improving from prior. On statin. Taking asa 81 qd.  Optho: Seen annually by - last exam  Feet: Monofilament exam done . Denies any no problems.  Not seen by podiatry prior.  Immunizations:             Influenza:  Pneumovax-23:  Still taking twice weekly vitamin D Is going to get an annual skin check.   singulair as needed for wheezing.  Heartburn is a lot.   Results for orders placed or performed in visit on 01/16/17  Comprehensive metabolic panel  Result Value Ref Range   Glucose 178 (H) 65 - 99 mg/dL   BUN 16 8 - 27 mg/dL   Creatinine, Ser 0.61 0.57 - 1.00 mg/dL   GFR calc non Af Amer 99 >59 mL/min/1.73   GFR calc Af Amer 114 >59 mL/min/1.73   BUN/Creatinine Ratio 26 12 - 28   Sodium 139 134 - 144 mmol/L   Potassium 3.7 3.5 - 5.2 mmol/L   Chloride 96 96 - 106 mmol/L   CO2 25 20 - 29 mmol/L   Calcium 9.3 8.7 - 10.3 mg/dL   Total Protein 7.4 6.0 - 8.5 g/dL   Albumin 4.3 3.6 - 4.8 g/dL   Globulin, Total 3.1 1.5 - 4.5 g/dL   Albumin/Globulin Ratio 1.4 1.2 - 2.2   Bilirubin Total <0.2 0.0 - 1.2 mg/dL   Alkaline Phosphatase 78 39 - 117 IU/L   AST 28 0 - 40 IU/L   ALT 23 0 - 32 IU/L  VITAMIN D 25 Hydroxy (Vit-D Deficiency, Fractures)  Result Value Ref Range   Vit D, 25-Hydroxy 15.8 (L) 30.0 - 100.0 ng/mL  POCT glycosylated hemoglobin (Hb A1C)  Result Value Ref Range   Hemoglobin A1C 8.3    Depression screen [facility] 2/9 10/17/2016 07/13/2016 01/04/2016 12/06/2015 08/24/2015  Decreased Interest 0 0 0 0 0  Down, Depressed, Hopeless 0 0 0 0 0  PHQ - 2 Score 0 0 0 0 0    Review of Systems See hpi    Objective:   Physical Exam  Constitutional: She is oriented to person, place, and time. She appears well-developed and well-nourished. No distress.  HENT:  Head: Normocephalic and atraumatic.  Right Ear: External ear normal.  Left Ear: External ear normal.  Eyes: Conjunctivae are normal. No scleral icterus.  Neck: Normal range of motion. Neck supple. No thyromegaly present.  Cardiovascular: Normal rate, regular rhythm, normal heart sounds and intact distal pulses.  Pulmonary/Chest: Effort normal and breath sounds normal. No respiratory distress.  Musculoskeletal: She exhibits no edema.    Lymphadenopathy:    She has no cervical adenopathy.  Neurological: She is alert and oriented to person, place, and time.  Skin: Skin is warm and dry. She is not diaphoretic. No erythema.  Psychiatric: She has a normal mood and affect. Her behavior is normal.         BP 126/82   Pulse 86   Temp 98.4 F (36.9 C)   Resp 16   Ht 5' 4.75" (1.645 m)   Wt 244 lb 12.8 oz (111 kg)   SpO2 97%   BMI 41.05 kg/m   Results for orders placed or performed in visit on 01/16/17  POCT glycosylated hemoglobin (Hb A1C)  Result Value Ref Range   Hemoglobin A1C 8.3     Assessment & Plan:   1. Uncontrolled type 2 diabetes mellitus with hyperglycemia (HCC) - a1c continues to slowly creep up - start sglt2 inh - demo'd bydureon, watched video, coupon given. Has not been taking glipizide so stay off. Cont metformin XR 2g/d.  2. Vitamin D deficiency - level SUBSTANTIALLY DROPPED 27 ->23 -> 23 ->16 despite increasing high dose rx 50K vit D from once wkly to twice wkly so will increase to 50K qod x 3 mos only - ensure level recheck before continuing - DO NOT REFILL outside of OV. If still to low - maybe need to change to cholecalciferol supp instead?    Orders Placed This Encounter  Procedures  . Comprehensive metabolic panel  . VITAMIN D 25 Hydroxy (Vit-D Deficiency, Fractures)  . VITAMIN D 25 Hydroxy (Vit-D Deficiency, Fractures)  . POCT glycosylated hemoglobin (Hb A1C)    Meds ordered this encounter  Medications  . metformin (FORTAMET) 1000 MG (OSM) 24 hr tablet    Sig: TAKE 2 TABLETS (2,000 MG TOTAL) AT BEDTIME    Dispense:  180 tablet    Refill:  3  . DISCONTD: Exenatide ER (BYDUREON BCISE) 2 MG/0.85ML AUIJ    Sig: Inject 2 mg into the skin once a week.    Dispense:  4 pen    Refill:  5  . Exenatide ER (BYDUREON BCISE) 2 MG/0.85ML AUIJ    Sig: Inject 2 mg into the skin once a week.    Dispense:  4 pen    Refill:  5  . traMADol (ULTRAM) 50 MG tablet    Sig: Take 1 tablet (50 mg total)  by mouth every 6 (six) hours as needed.  Dispense:  360 tablet    Refill:  1    This is 3 month supply with a refill for a total of 6 months of medication  . metaxalone (SKELAXIN) 800 MG tablet    Sig: Take 1 tablet (800 mg total) by mouth 3 (three) times daily.    Dispense:  30 tablet    Refill:  3     [name], M.D.  Primary Care at [facility] [address] [place], [place] [address] [phone] phone [phone] fax  01/19/17 6:49 AM

## 2017-01-16 NOTE — Patient Instructions (Signed)
     IF you received an x-ray today, you will receive an invoice from [facility]. Please contact [facility] at [phone] with questions or concerns regarding your invoice.   IF you received labwork today, you will receive an invoice from [facility]. Please contact [facility] at [phone] with questions or concerns regarding your invoice.   Our billing staff will not be able to assist you with questions regarding bills from these companies.  You will be contacted with the lab results as soon as they are available. The fastest way to get your results is to activate your My Chart account. Instructions are located on the last page of this paperwork. If you have not heard from us regarding the results in 2 weeks, please contact this office.     

## 2017-01-17 LAB — COMPREHENSIVE METABOLIC PANEL
A/G RATIO: 1.4 (ref 1.2–2.2)
ALT: 23 IU/L (ref 0–32)
AST: 28 IU/L (ref 0–40)
Albumin: 4.3 g/dL (ref 3.6–4.8)
Alkaline Phosphatase: 78 IU/L (ref 39–117)
BUN / CREAT RATIO: 26 (ref 12–28)
BUN: 16 mg/dL (ref 8–27)
Bilirubin Total: <0.2 mg/dL (ref 0.0–1.2)
CALCIUM: 9.3 mg/dL (ref 8.7–10.3)
CHLORIDE: 96 mmol/L (ref 96–106)
CO2: 25 mmol/L (ref 20–29)
Creatinine, Ser: 0.61 mg/dL (ref 0.57–1.00)
GFR calc Af Amer: 114 mL/min/{1.73_m2} (ref >59)
GFR calc non Af Amer: 99 mL/min/{1.73_m2} (ref >59)
GLOBULIN, TOTAL: 3.1 g/dL (ref 1.5–4.5)
Glucose: 178 mg/dL — ABNORMAL HIGH (ref 65–99)
Potassium: 3.7 mmol/L (ref 3.5–5.2)
SODIUM: 139 mmol/L (ref 134–144)
TOTAL PROTEIN: 7.4 g/dL (ref 6.0–8.5)

## 2017-01-17 LAB — VITAMIN D 25 HYDROXY (VIT D DEFICIENCY, FRACTURES): VIT D 25 HYDROXY: 15.8 ng/mL — AB (ref 30.0–100.0)

## 2017-01-26 MED ORDER — ERGOCALCIFEROL 1.25 MG (50000 UT) PO CAPS: 50000.0000 [IU] | ORAL_CAPSULE | ORAL | 0 refills | Status: DC

## 2017-01-26 NOTE — Addendum Note (Signed)
Addended by: Shawnee Knapp on: 01/26/2017 07:46 AM   Modules accepted: Orders

## 2017-04-17 ENCOUNTER — Encounter: Payer: Self-pay | Admitting: Family Medicine

## 2017-04-17 ENCOUNTER — Other Ambulatory Visit: Payer: Self-pay

## 2017-04-17 ENCOUNTER — Ambulatory Visit: Payer: Managed Care, Other (non HMO) | Admitting: Family Medicine

## 2017-04-17 VITALS — BP 120/78 | HR 102 | Temp 98.3°F | Resp 18 | Ht 64.75 in | Wt 238.0 lb

## 2017-04-17 DIAGNOSIS — G894 Chronic pain syndrome: Secondary | ICD-10-CM | POA: Diagnosis not present

## 2017-04-17 DIAGNOSIS — Z5181 Encounter for therapeutic drug level monitoring: Secondary | ICD-10-CM

## 2017-04-17 DIAGNOSIS — Z79899 Other long term (current) drug therapy: Secondary | ICD-10-CM | POA: Diagnosis not present

## 2017-04-17 DIAGNOSIS — E559 Vitamin D deficiency, unspecified: Secondary | ICD-10-CM | POA: Diagnosis not present

## 2017-04-17 DIAGNOSIS — K760 Fatty (change of) liver, not elsewhere classified: Secondary | ICD-10-CM

## 2017-04-17 DIAGNOSIS — G8929 Other chronic pain: Secondary | ICD-10-CM | POA: Diagnosis not present

## 2017-04-17 DIAGNOSIS — M545 Low back pain, unspecified: Secondary | ICD-10-CM

## 2017-04-17 DIAGNOSIS — E1165 Type 2 diabetes mellitus with hyperglycemia: Secondary | ICD-10-CM

## 2017-04-17 DIAGNOSIS — E890 Postprocedural hypothyroidism: Secondary | ICD-10-CM

## 2017-04-17 LAB — POCT GLYCOSYLATED HEMOGLOBIN (HGB A1C): Hemoglobin A1C: 7.5

## 2017-04-17 NOTE — Progress Notes (Addendum)
Subjective:  By signing my name below, I, Donna [name], attest that this documentation has been prepared under the direction and in the presence of Donna Cheadle, MD Electronically Signed: [name], ED Scribe 04/17/2017 at 4:10 PM.   Patient ID: Donna [name], female    DOB: April 02, 1955, 62 y.o.   MRN: [id number]  Chief Complaint  Patient presents with  . Diabetes  . Follow-up   HPI Donna [name] is a 62 y.o. female who presents to Primary Care at [facility] for f/u on DM.  DMII: Diagnosed ~2010.   Lab Results  Component Value Date   HGBA1C 7.5 04/17/2017   HGBA1C 8.3 01/16/2017   HGBA1C 7.9 10/17/2016   CBGs: fasting a.m. avg ~140; No hypoglycemic episodes. but has noticed a flushing sensation when her glucose rises which she states is rare.  Meter type:  Diet:  Working on it Exercising: none  DM Med Regimen: Bydureon Bcise & metformin XR 2 g per day.  Prior changes:  :Started bydureon bcise 01/16/2017 (last visit)  Saw endocrinology Dr. Dwyane Dee at [facility] for aid in DM management 01/2014 (this was 3 mos after her hysterectomy for endometrial carcinoma) and Antonieta Iba RD for mult DM ed/nutrition visitsfor 3 visits only to each Jan-March, 2016 - 1 a month in Jan, Feb, March - the pt never went back.  metformin 2g causes diarrhea  eGFR: high 90s Baseline Cr: 0.6 Last checked 04/17/17. Microalb: Normal 04/17/17. On arb Lipids:  LDL ,  non-HDL .  Last levels done  - were improving from prior. On statin. Taking asa 81 qd.  Optho: Seen annually by - last exam  Feet: Monofilament exam done . Denies any no problems.  Not seen by podiatry prior.  Immunizations:  Influenza:  Pneumovax-23:  Arthralgias Pt reports L hip, back and neck pain with rotating head to the R over the past few wks. She describes pain as an aching sensation that is worse at night; reports it will even wake her from her sleep at times. States pain occasionally radiates into her UEs if she does a lot of yard work.  Pt suspects that pain is exacerbated by the weather changing. Also reports popping in her joints with standing. She is currently taking 1 Tramadol tab at a time which makes her pain tolerable without drowsiness. Pt takes 1 tab ~1 hour after waking, another at noon and another tab ~1 hour before bed. She has tried Skelaxin but not with Tramadol as it does cause drowsiness. She has also rubbed a topical cream to her joints with some relief. Denies weakness, numbness.  Vitamin D deficiency: Vit D was substantially dropping even when high dose was increased from once to twice weekly so had pt take every other day x 3 months.   Current Outpatient Medications on File Prior to Visit  Medication Sig Dispense Refill  . albuterol (PROVENTIL HFA;VENTOLIN HFA) 108 (90 Base) MCG/ACT inhaler Inhale 2 puffs into the lungs every 4 (four) hours as needed for wheezing or shortness of breath. 1 Inhaler 11  . blood glucose meter kit and supplies KIT Dispense based on patient and insurance preference. Use up to four times daily as directed. (FOR ICD-9 250.00, 250.01). 1 each 0  . cyclobenzaprine (FLEXERIL) 10 MG tablet Take 1 tablet (10 mg total) by mouth at bedtime. 30 tablet 3  . ergocalciferol (VITAMIN D2) 50000 units capsule Take 1 capsule (50,000 Units total) by mouth every other day. 45 capsule 0  .  Exenatide ER (BYDUREON BCISE) 2 MG/0.85ML AUIJ Inject 2 mg into the skin once a week. 4 pen 5  . Fluocinolone Acetonide 0.01 % OIL Place 5 drops in ear(s) 2 (two) times daily as needed ("eczema in ears"). 20 mL 5  . IBU 800 MG tablet TAKE 1 TABLET THREE TIMES A DAY AS NEEDED 270 tablet 0  . ipratropium (ATROVENT) 0.06 % nasal spray Place 2 sprays into both nostrils 3 (three) times daily as needed for rhinitis. Reported on 03/23/2015    . loratadine (CLARITIN) 10 MG tablet Take 1 tablet (10 mg total) by mouth daily. 30 tablet 11  . losartan-hydrochlorothiazide (HYZAAR) 100-12.5 MG tablet Take 1 tablet by mouth daily. 90  tablet 3  . metaxalone (SKELAXIN) 800 MG tablet Take 1 tablet (800 mg total) by mouth 3 (three) times daily. 30 tablet 3  . metformin (FORTAMET) 1000 MG (OSM) 24 hr tablet TAKE 2 TABLETS (2,000 MG TOTAL) AT BEDTIME 180 tablet 3  . metoprolol succinate (TOPROL-XL) 100 MG 24 hr tablet Take 1 tablet (100 mg total) by mouth daily. Take with or immediately following a meal. 90 tablet 3  . montelukast (SINGULAIR) 10 MG tablet Take 1 tablet (10 mg total) by mouth at bedtime. 90 tablet 3  . pantoprazole (PROTONIX) 40 MG tablet TAKE 1 TABLET EVERY EVENING 90 tablet 3  . ranitidine (ZANTAC) 150 MG tablet TAKE 1 TABLET TWICE A DAY 180 tablet 1  . sodium chloride (OCEAN) 0.65 % SOLN nasal spray Place 1 spray into both nostrils as needed for congestion. 1 Bottle 0  . traMADol (ULTRAM) 50 MG tablet Take 1 tablet (50 mg total) by mouth every 6 (six) hours as needed. 360 tablet 1   No current facility-administered medications on file prior to visit.    Past Medical History:  Diagnosis Date  . Allergy   . Arthritis   . Diabetes mellitus without complication ([place])   . Difficulty sleeping   . Diverticulitis   . Endometrial cancer ([place])   . GERD (gastroesophageal reflux disease)   . History of bronchitis   . History of gout   . Hypertension   . Hypothyroid   . Skin cancer (melanoma) ([place])    to be removed 12/09/13   Past Surgical History:  Procedure Laterality Date  . CHOLECYSTECTOMY    . ROBOTIC ASSISTED TOTAL HYSTERECTOMY WITH BILATERAL SALPINGO OOPHERECTOMY Bilateral 11/23/2013   Procedure: ROBOTIC ASSISTED TOTAL HYSTERECTOMY WITH BILATERAL SALPINGO OOPHORECTOMY, LYMPH NODE DISSECTION ,LYSIS OF ADHESIONS,PELVIC WASHINGS ;  Surgeon: Everitt Amber, MD;  Location: [facility];  Service: Gynecology;  Laterality: Bilateral;  . skin cancer removal     melanoma removed from back   . SUTURE REMOVAL N/A 11/23/2013   Procedure: SUTURE REMOVAL;  Surgeon: Everitt Amber, MD;  Location: [facility];  Service: Gynecology;   Laterality: N/A;  . THYROIDECTOMY     Current Outpatient Medications on File Prior to Visit  Medication Sig Dispense Refill  . albuterol (PROVENTIL HFA;VENTOLIN HFA) 108 (90 Base) MCG/ACT inhaler Inhale 2 puffs into the lungs every 4 (four) hours as needed for wheezing or shortness of breath. 1 Inhaler 11  . blood glucose meter kit and supplies KIT Dispense based on patient and insurance preference. Use up to four times daily as directed. (FOR ICD-9 250.00, 250.01). 1 each 0  . cyclobenzaprine (FLEXERIL) 10 MG tablet Take 1 tablet (10 mg total) by mouth at bedtime. 30 tablet 3  . ergocalciferol (VITAMIN D2) 50000 units capsule Take 1 capsule (  50,000 Units total) by mouth every other day. 45 capsule 0  . Exenatide ER (BYDUREON BCISE) 2 MG/0.85ML AUIJ Inject 2 mg into the skin once a week. 4 pen 5  . Fluocinolone Acetonide 0.01 % OIL Place 5 drops in ear(s) 2 (two) times daily as needed ("eczema in ears"). 20 mL 5  . IBU 800 MG tablet TAKE 1 TABLET THREE TIMES A DAY AS NEEDED 270 tablet 0  . ipratropium (ATROVENT) 0.06 % nasal spray Place 2 sprays into both nostrils 3 (three) times daily as needed for rhinitis. Reported on 03/23/2015    . loratadine (CLARITIN) 10 MG tablet Take 1 tablet (10 mg total) by mouth daily. 30 tablet 11  . losartan-hydrochlorothiazide (HYZAAR) 100-12.5 MG tablet Take 1 tablet by mouth daily. 90 tablet 3  . metaxalone (SKELAXIN) 800 MG tablet Take 1 tablet (800 mg total) by mouth 3 (three) times daily. 30 tablet 3  . metformin (FORTAMET) 1000 MG (OSM) 24 hr tablet TAKE 2 TABLETS (2,000 MG TOTAL) AT BEDTIME 180 tablet 3  . metoprolol succinate (TOPROL-XL) 100 MG 24 hr tablet Take 1 tablet (100 mg total) by mouth daily. Take with or immediately following a meal. 90 tablet 3  . montelukast (SINGULAIR) 10 MG tablet Take 1 tablet (10 mg total) by mouth at bedtime. 90 tablet 3  . pantoprazole (PROTONIX) 40 MG tablet TAKE 1 TABLET EVERY EVENING 90 tablet 3  . ranitidine (ZANTAC)  150 MG tablet TAKE 1 TABLET TWICE A DAY 180 tablet 1  . sodium chloride (OCEAN) 0.65 % SOLN nasal spray Place 1 spray into both nostrils as needed for congestion. 1 Bottle 0  . traMADol (ULTRAM) 50 MG tablet Take 1 tablet (50 mg total) by mouth every 6 (six) hours as needed. 360 tablet 1   No current facility-administered medications on file prior to visit.    Allergies  Allergen Reactions  . Glimepiride Other (See Comments)    Per patient causes severe gas pain  . Morphine And Related Nausea And Vomiting  . Colcrys [Colchicine] Rash  . Lisinopril Rash   Family History  Problem Relation Age of Onset  . Stroke Mother   . Heart disease Mother   . Cancer Father 12       pancreatic  . Diabetes Father   . Cancer Brother 52       leukemia  . Diabetes Paternal Uncle    Social History   Socioeconomic History  . Marital status: Single    Spouse name: Not on file  . Number of children: Not on file  . Years of education: Not on file  . Highest education level: Not on file  Occupational History  . Not on file  Social Needs  . Financial resource strain: Not on file  . Food insecurity:    Worry: Not on file    Inability: Not on file  . Transportation needs:    Medical: Not on file    Non-medical: Not on file  Tobacco Use  . Smoking status: Former Smoker    Last attempt to quit: 11/20/2007    Years since quitting: 9.4  . Smokeless tobacco: Never Used  Substance and Sexual Activity  . Alcohol use: No    Comment: occasional  . Drug use: No  . Sexual activity: Never  Lifestyle  . Physical activity:    Days per week: Not on file    Minutes per session: Not on file  . Stress: Not on file  Relationships  .  Social connections:    Talks on phone: Not on file    Gets together: Not on file    Attends religious service: Not on file    Active member of club or organization: Not on file    Attends meetings of clubs or organizations: Not on file    Relationship status: Not on file   Other Topics Concern  . Not on file  Social History Narrative  . Not on file   Depression screen [facility] 2/9 04/17/2017 10/17/2016 07/13/2016 01/04/2016 12/06/2015  Decreased Interest 0 0 0 0 0  Down, Depressed, Hopeless 0 0 0 0 0  PHQ - 2 Score 0 0 0 0 0     Review of Systems  Musculoskeletal: Positive for arthralgias, back pain and neck pain.  Neurological: Negative for weakness and numbness.      Objective:   Physical Exam  Constitutional: She is oriented to person, place, and time. She appears well-developed and well-nourished. No distress.  HENT:  Head: Normocephalic and atraumatic.  Eyes: Conjunctivae and EOM are normal.  Neck: Neck supple. No tracheal deviation present.  Cardiovascular: Normal rate, regular rhythm and normal heart sounds.  Pulmonary/Chest: Effort normal and breath sounds normal. No respiratory distress.  Musculoskeletal: Normal range of motion.  Over the halfway of R clavicle towards the medial aspect is 4 cm width x 5 cm length mass.  Neurological: She is alert and oriented to person, place, and time.  Skin: Skin is warm and dry.  Psychiatric: She has a normal mood and affect. Her behavior is normal.  Nursing note and vitals reviewed.  BP 120/78 (BP Location: Left Arm, Patient Position: Sitting, Cuff Size: Large)   Pulse (!) 102   Temp 98.3 F (36.8 C) (Oral)   Resp 18   Ht 5' 4.75" (1.645 m)   Wt 238 lb (108 kg)   SpO2 95%   BMI 39.91 kg/m     Results for orders placed or performed in visit on 04/17/17  POCT glycosylated hemoglobin (Hb A1C)  Result Value Ref Range   Hemoglobin A1C 7.5    Assessment & Plan:   1. Uncontrolled type 2 diabetes mellitus with hyperglycemia ([place])   2. Vitamin D deficiency   3. Fatty infiltration of liver   4. Postoperative hypothyroidism   5. Chronic bilateral low back pain without sciatica   6. Chronic pain syndrome   7. Medication monitoring encounter   8. High risk medications (not anticoagulants) long-term use     I saw a note, that in 2015, a pre-op RN had screened pt for OSA using the STOP-BANG tool - >=4 is elevated risk for OSA and pt was at 5.  It doesn't look like pt was ever sent for additional evaluation as she had so much going on then with endometrial cancer and several other acute health issues - but now that she is doing great, need to discuss referral for sleep study at her next visit.  Orders Placed This Encounter  Procedures  . VITAMIN D 25 Hydroxy (Vit-D Deficiency, Fractures)  . Comprehensive metabolic panel  . TSH+T4F+T3Free  . Microalbumin/Creatinine Ratio, Urine  . POCT glycosylated hemoglobin (Hb A1C)    Meds ordered this encounter  Medications  . traMADol (ULTRAM) 50 MG tablet    Sig: Take 2 tablets (100 mg total) by mouth every 8 (eight) hours as needed.    Dispense:  540 tablet    Refill:  1    This is 3 month supply with  a refill for a total of 6 months of medication  . ergocalciferol (VITAMIN D2) 50000 units capsule    Sig: Take 1 capsule (50,000 Units total) by mouth 3 (three) times a week.    Dispense:  40 capsule    Refill:  1    New sig - d/c prior rx for every other day  . cyclobenzaprine (FLEXERIL) 10 MG tablet    Sig: Take 1 tablet (10 mg total) by mouth at bedtime.    Dispense:  90 tablet    Refill:  1    I personally performed the services described in this documentation, which was scribed in my presence. The recorded information has been reviewed and considered, and addended by me as needed.   Donna [name], M.D.  Primary Care at [facility] [address] [place], [place] [address] [phone] phone [phone] fax  05/07/17 2:30 PM

## 2017-04-17 NOTE — Patient Instructions (Signed)
     IF you received an x-ray today, you will receive an invoice from [facility]. Please contact [facility] at [phone] with questions or concerns regarding your invoice.   IF you received labwork today, you will receive an invoice from [facility]. Please contact [facility] at [phone] with questions or concerns regarding your invoice.   Our billing staff will not be able to assist you with questions regarding bills from these companies.  You will be contacted with the lab results as soon as they are available. The fastest way to get your results is to activate your My Chart account. Instructions are located on the last page of this paperwork. If you have not heard from us regarding the results in 2 weeks, please contact this office.     

## 2017-04-18 LAB — COMPREHENSIVE METABOLIC PANEL
A/G RATIO: 1.6 (ref 1.2–2.2)
ALK PHOS: 78 IU/L (ref 39–117)
ALT: 23 IU/L (ref 0–32)
AST: 28 IU/L (ref 0–40)
Albumin: 4.5 g/dL (ref 3.6–4.8)
BUN/Creatinine Ratio: 20 (ref 12–28)
BUN: 13 mg/dL (ref 8–27)
Bilirubin Total: 0.3 mg/dL (ref 0.0–1.2)
CHLORIDE: 100 mmol/L (ref 96–106)
CO2: 21 mmol/L (ref 20–29)
Calcium: 9.5 mg/dL (ref 8.7–10.3)
Creatinine, Ser: 0.66 mg/dL (ref 0.57–1.00)
GFR calc Af Amer: 111 mL/min/{1.73_m2} (ref >59)
GFR calc non Af Amer: 96 mL/min/{1.73_m2} (ref >59)
GLOBULIN, TOTAL: 2.8 g/dL (ref 1.5–4.5)
Glucose: 180 mg/dL — ABNORMAL HIGH (ref 65–99)
POTASSIUM: 3.8 mmol/L (ref 3.5–5.2)
Sodium: 143 mmol/L (ref 134–144)
Total Protein: 7.3 g/dL (ref 6.0–8.5)

## 2017-04-18 LAB — MICROALBUMIN / CREATININE URINE RATIO
Creatinine, Urine: 59 mg/dL
Microalb/Creat Ratio: <5.1 mg/g creat (ref 0.0–30.0)
Microalbumin, Urine: <3 ug/mL

## 2017-04-18 LAB — TSH+T4F+T3FREE
Free T4: 1.24 ng/dL (ref 0.82–1.77)
T3 FREE: 3.1 pg/mL (ref 2.0–4.4)
TSH: 0.72 u[IU]/mL (ref 0.450–4.500)

## 2017-04-18 LAB — VITAMIN D 25 HYDROXY (VIT D DEFICIENCY, FRACTURES): Vit D, 25-Hydroxy: 46.3 ng/mL (ref 30.0–100.0)

## 2017-05-06 ENCOUNTER — Telehealth: Payer: Self-pay

## 2017-05-06 NOTE — Telephone Encounter (Signed)
Copied from [place] [id number]. Topic: General - Other >> May 06, 2017  8:41 AM Yvette Rack wrote: Reason for CRM: pt calling to see if she should continue taking her ergocalciferol (VITAMIN D2) 50000 units capsule the way Dr Brigitte Pulse advised her to do because if so she would need another refill

## 2017-05-06 NOTE — Telephone Encounter (Signed)
Please advise 

## 2017-05-07 ENCOUNTER — Encounter: Payer: Self-pay | Admitting: Family Medicine

## 2017-05-07 DIAGNOSIS — G894 Chronic pain syndrome: Secondary | ICD-10-CM | POA: Insufficient documentation

## 2017-05-07 DIAGNOSIS — Z79899 Other long term (current) drug therapy: Secondary | ICD-10-CM | POA: Insufficient documentation

## 2017-05-07 MED ORDER — ERGOCALCIFEROL 1.25 MG (50000 UT) PO CAPS: 50000.0000 [IU] | ORAL_CAPSULE | ORAL | 1 refills | Status: DC

## 2017-05-07 MED ORDER — TRAMADOL HCL 50 MG PO TABS: 100.0000 mg | ORAL_TABLET | Freq: Three times a day (TID) | ORAL | 1 refills | Status: DC | PRN

## 2017-05-07 MED ORDER — CYCLOBENZAPRINE HCL 10 MG PO TABS: 10.0000 mg | ORAL_TABLET | Freq: Every day | ORAL | 1 refills | Status: DC

## 2017-05-07 NOTE — Telephone Encounter (Addendum)
Have sent pt a MyChart response to this question attached to her vit D lab and new rx is at pharmacy so NO NEED TO CALL PT BACK I don't think.  Sending in new rx to pharm but I would like her to decrease her dose just slightly to a scheduled 3x/wk rather than every other day - so e.g. Take the vitamin D every M, W, F.

## 2017-05-10 ENCOUNTER — Telehealth: Payer: Self-pay | Admitting: Family Medicine

## 2017-05-10 NOTE — Telephone Encounter (Signed)
Pt. Is requesting refills be swapped over to 90 day supply increments. She alleges her insurance will only pay for 90 day refill supplies, and she is unable to pay the full   Best number [phone]

## 2017-05-14 ENCOUNTER — Telehealth: Payer: Self-pay | Admitting: Family Medicine

## 2017-05-14 NOTE — Telephone Encounter (Signed)
Copied from [place] [id number]. Topic: Quick Communication - Rx Refill/Question >> May 14, 2017  4:29 PM Oliver Pila B wrote: Medication: Exenatide ER (BYDUREON BCISE) 2 MG/0.85ML AUIJ [[id number]]  82 day supply Has the patient contacted their pharmacy? Yes.   (Agent: If no, request that the patient contact the pharmacy for the refill.) Preferred Pharmacy (with phone number or street name): [facility] Agent: Please be advised that RX refills may take up to 3 business days. We ask that you follow-up with your pharmacy.

## 2017-05-14 NOTE — Telephone Encounter (Signed)
Patient called back requesting status on the new prescription that is suppose to be written for a 90day supply.

## 2017-05-14 NOTE — Telephone Encounter (Signed)
[facility] called and spoke to [place], [employer] officer, who verified receipt and pick up by patient of the Vit D. I asked if it was written for 90 days as this is what the patient called to ask for, she says it was filled for 30, but the next refill can be filled for 90 days and let the patient know to call the pharmacy to request the 90 day refill the next time it is due. I called the patient, left detailed VM of the above and advised to contact the pharmacy with questions about the 90 day supply.

## 2017-05-15 MED ORDER — EXENATIDE ER 2 MG/0.85ML ~~LOC~~ AUIJ: 2.0000 mg | AUTO-INJECTOR | SUBCUTANEOUS | 5 refills | Status: DC

## 2017-07-26 ENCOUNTER — Other Ambulatory Visit: Payer: Self-pay | Admitting: Family Medicine

## 2017-07-29 ENCOUNTER — Telehealth: Payer: Self-pay | Admitting: Family Medicine

## 2017-07-29 NOTE — Telephone Encounter (Signed)
Copied from [place] [id number]. Topic: Quick Communication - See Telephone Encounter >> Jul 29, 2017  5:41 PM Neva Seat wrote: Pt's traMADol (ULTRAM) 50 MG tablet refills were denied.  Pt also doesn't have enough to last until visit. Pt states she spoke w/ Dr. Brigitte Pulse about it not working well. Please call pt to discuss what needs to be done.

## 2017-07-30 MED ORDER — TRAMADOL HCL 50 MG PO TABS: 100.0000 mg | ORAL_TABLET | Freq: Three times a day (TID) | ORAL | 2 refills | Status: DC | PRN

## 2017-07-30 NOTE — Addendum Note (Signed)
Addended by: Shawnee Knapp on: 07/30/2017 07:19 PM   Modules accepted: Orders

## 2017-07-30 NOTE — Telephone Encounter (Signed)
It was denied because at her last visit I increased her tramadol from 4 tabs/d to 6 tabs/d and sent a 3 month supply with a refill for a total of a [facility] [address].  She has NOT filled this new rx at all - it should be on [facility] for [facility] # - she will have to talk to a pharmacist to get them to look for a new rx - can't just call in for a refill on the current bottle or they won't see there is a new active rx. But for now, I will send in a new rx for a 3 mos supply since it is a holiday and no one will get back to her w/ this message for a while.  Please let pt know so in future she can have pharmacy double check that there is not a NEW rx when one of her chronic meds is denied so there are not tons of duplicates.

## 2017-08-21 ENCOUNTER — Encounter: Payer: Self-pay | Admitting: Family Medicine

## 2017-08-21 ENCOUNTER — Ambulatory Visit: Payer: Managed Care, Other (non HMO) | Admitting: Family Medicine

## 2017-08-21 ENCOUNTER — Other Ambulatory Visit: Payer: Self-pay

## 2017-08-21 VITALS — BP 119/77 | HR 80 | Temp 98.5°F | Resp 18 | Ht 64.0 in | Wt 233.8 lb

## 2017-08-21 DIAGNOSIS — G479 Sleep disorder, unspecified: Secondary | ICD-10-CM

## 2017-08-21 DIAGNOSIS — E559 Vitamin D deficiency, unspecified: Secondary | ICD-10-CM | POA: Diagnosis not present

## 2017-08-21 DIAGNOSIS — G8929 Other chronic pain: Secondary | ICD-10-CM

## 2017-08-21 DIAGNOSIS — Z79899 Other long term (current) drug therapy: Secondary | ICD-10-CM | POA: Diagnosis not present

## 2017-08-21 DIAGNOSIS — E1165 Type 2 diabetes mellitus with hyperglycemia: Secondary | ICD-10-CM | POA: Diagnosis not present

## 2017-08-21 DIAGNOSIS — M545 Low back pain: Secondary | ICD-10-CM

## 2017-08-21 DIAGNOSIS — E538 Deficiency of other specified B group vitamins: Secondary | ICD-10-CM

## 2017-08-21 DIAGNOSIS — Z1211 Encounter for screening for malignant neoplasm of colon: Secondary | ICD-10-CM

## 2017-08-21 DIAGNOSIS — G894 Chronic pain syndrome: Secondary | ICD-10-CM

## 2017-08-21 DIAGNOSIS — Z1212 Encounter for screening for malignant neoplasm of rectum: Secondary | ICD-10-CM

## 2017-08-21 DIAGNOSIS — H60543 Acute eczematoid otitis externa, bilateral: Secondary | ICD-10-CM

## 2017-08-21 LAB — POCT GLYCOSYLATED HEMOGLOBIN (HGB A1C): Hemoglobin A1C: 7 % — AB (ref 4.0–5.6)

## 2017-08-21 MED ORDER — HYDROCORTISONE-ACETIC ACID 1-2 % OT SOLN
5.0000 [drp] | Freq: Two times a day (BID) | OTIC | 3 refills | Status: DC
Start: 2017-08-21 — End: 2018-09-13

## 2017-08-21 NOTE — Patient Instructions (Addendum)
IF you received an x-ray today, you will receive an invoice from [facility]. Please contact [facility] at [phone] with questions or concerns regarding your invoice.   IF you received labwork today, you will receive an invoice from [facility]. Please contact [facility] at [phone] with questions or concerns regarding your invoice.   Our billing staff will not be able to assist you with questions regarding bills from these companies.  You will be contacted with the lab results as soon as they are available. The fastest way to get your results is to activate your My Chart account. Instructions are located on the last page of this paperwork. If you have not heard from [place] regarding the results in 2 weeks, please contact this office.      Sleep Apnea Sleep apnea is a condition in which breathing pauses or becomes shallow during sleep. Episodes of sleep apnea usually last 10 seconds or longer, and they may occur as many as 20 times an hour. Sleep apnea disrupts your sleep and keeps your body from getting the rest that it needs. This condition can increase your risk of certain health problems, including:  Heart attack.  Stroke.  Obesity.  Diabetes.  Heart failure.  Irregular heartbeat.  There are three kinds of sleep apnea:  Obstructive sleep apnea. This kind is caused by a blocked or collapsed airway.  Central sleep apnea. This kind happens when the part of the brain that controls breathing does not send the correct signals to the muscles that control breathing.  Mixed sleep apnea. This is a combination of obstructive and central sleep apnea.  What are the causes? The most common cause of this condition is a collapsed or blocked airway. An airway can collapse or become blocked if:  Your throat muscles are abnormally relaxed.  Your tongue and tonsils are larger than normal.  You are overweight.  Your airway is smaller than normal.  What increases  the risk? This condition is more likely to develop in people who:  Are overweight.  Smoke.  Have a smaller than normal airway.  Are elderly.  Are female.  Drink alcohol.  Take sedatives or tranquilizers.  Have a family history of sleep apnea.  What are the signs or symptoms? Symptoms of this condition include:  Trouble staying asleep.  Daytime sleepiness and tiredness.  Irritability.  Loud snoring.  Morning headaches.  Trouble concentrating.  Forgetfulness.  Decreased interest in sex.  Unexplained sleepiness.  Mood swings.  Personality changes.  Feelings of depression.  Waking up often during the night to urinate.  Dry mouth.  Sore throat.  How is this diagnosed? This condition may be diagnosed with:  A medical history.  A physical exam.  A series of tests that are done while you are sleeping (sleep study). These tests are usually done in a sleep lab, but they may also be done at home.  How is this treated? Treatment for this condition aims to restore normal breathing and to ease symptoms during sleep. It may involve managing health issues that can affect breathing, such as high blood pressure or obesity. Treatment may include:  Sleeping on your side.  Using a decongestant if you have nasal congestion.  Avoiding the use of depressants, including alcohol, sedatives, and narcotics.  Losing weight if you are overweight.  Making changes to your diet.  Quitting smoking.  Using a device to open your airway while you sleep, such as: ? An oral appliance. This is a  custom-made mouthpiece that shifts your lower jaw forward. ? A continuous positive airway pressure (CPAP) device. This device delivers oxygen to your airway through a mask. ? A nasal expiratory positive airway pressure (EPAP) device. This device has valves that you put into each nostril. ? A bi-level positive airway pressure (BPAP) device. This device delivers oxygen to your airway  through a mask.  Surgery if other treatments do not work. During surgery, excess tissue is removed to create a wider airway.  It is important to get treatment for sleep apnea. Without treatment, this condition can lead to:  High blood pressure.  Coronary artery disease.  (Men) An inability to achieve or maintain an erection (impotence).  Reduced thinking abilities.  Follow these instructions at home:  Make any lifestyle changes that your health care provider recommends.  Eat a healthy, well-balanced diet.  Take over-the-counter and prescription medicines only as told by your health care provider.  Avoid using depressants, including alcohol, sedatives, and narcotics.  Take steps to lose weight if you are overweight.  If you were given a device to open your airway while you sleep, use it only as told by your health care provider.  Do not use any tobacco products, such as cigarettes, chewing tobacco, and e-cigarettes. If you need help quitting, ask your health care provider.  Keep all follow-up visits as told by your health care provider. This is important. Contact a health care provider if:  The device that you received to open your airway during sleep is uncomfortable or does not seem to be working.  Your symptoms do not improve.  Your symptoms get worse. Get help right away if:  You develop chest pain.  You develop shortness of breath.  You develop discomfort in your back, arms, or stomach.  You have trouble speaking.  You have weakness on one side of your body.  You have drooping in your face. These symptoms may represent a serious problem that is an emergency. Do not wait to see if the symptoms will go away. Get medical help right away. Call your local emergency services (911 in the [place] Do not drive yourself to the hospital. This information is not intended to replace advice given to you by your health care provider. Make sure you discuss any questions you  have with your health care provider. Document Released: 01/04/2002 Document Revised: 09/10/2015 Document Reviewed: 10/24/2014 Elsevier Interactive Patient Education  [employer].
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
## 2017-08-22 LAB — VITAMIN B12

## 2017-08-22 LAB — COMPREHENSIVE METABOLIC PANEL
ALBUMIN: 4.3 g/dL (ref 3.6–4.8)
ALK PHOS: 72 IU/L (ref 39–117)
ALT: 18 IU/L (ref 0–32)
AST: 18 IU/L (ref 0–40)
Albumin/Globulin Ratio: 1.7 (ref 1.2–2.2)
BILIRUBIN TOTAL: 0.2 mg/dL (ref 0.0–1.2)
BUN / CREAT RATIO: 20 (ref 12–28)
BUN: 12 mg/dL (ref 8–27)
CHLORIDE: 97 mmol/L (ref 96–106)
CO2: 24 mmol/L (ref 20–29)
Calcium: 9.6 mg/dL (ref 8.7–10.3)
Creatinine, Ser: 0.61 mg/dL (ref 0.57–1.00)
GFR calc Af Amer: 114 mL/min/{1.73_m2} (ref >59)
GFR calc non Af Amer: 99 mL/min/{1.73_m2} (ref >59)
GLOBULIN, TOTAL: 2.6 g/dL (ref 1.5–4.5)
GLUCOSE: 120 mg/dL — AB (ref 65–99)
Potassium: 4.1 mmol/L (ref 3.5–5.2)
SODIUM: 139 mmol/L (ref 134–144)
Total Protein: 6.9 g/dL (ref 6.0–8.5)

## 2017-08-22 LAB — LIPID PANEL
CHOLESTEROL TOTAL: 161 mg/dL (ref 100–199)
Chol/HDL Ratio: 4.9 ratio — ABNORMAL HIGH (ref 0.0–4.4)
HDL: 33 mg/dL — ABNORMAL LOW (ref >39)
LDL Calculated: 68 mg/dL (ref 0–99)
Triglycerides: 298 mg/dL — ABNORMAL HIGH (ref 0–149)
VLDL Cholesterol Cal: 60 mg/dL — ABNORMAL HIGH (ref 5–40)

## 2017-08-22 LAB — VITAMIN D 25 HYDROXY (VIT D DEFICIENCY, FRACTURES): Vit D, 25-Hydroxy: 35.7 ng/mL (ref 30.0–100.0)

## 2017-08-26 ENCOUNTER — Telehealth: Payer: Self-pay | Admitting: Family Medicine

## 2017-08-26 NOTE — Telephone Encounter (Signed)
Attempted to contact pt regarding refill request for metoprolol and ibuprofen; last physical exam 10/17/16; last office visit 08/21/16 has no discussion of need for medication; next office visit 12/18/17; will route request to office.  metoprolol refill Last Refill: 04/17/17 Last OV: 08/21/17 PCP: Dr [name] [facility] [address] [place], [place]  ibuprofen refill Last Refill: 11/27/16* Last OV:08/21/17 PCP: Dr [name] [facility] [address] [place], [place]

## 2017-08-26 NOTE — Telephone Encounter (Signed)
Copied from [place] [phone]. Topic: General - Other >> Aug 26, 2017 10:06 AM Oneta Rack wrote: Relation to pt: self   Call back number: [phone] [id number] [place] [phone] (Phone) [phone] (Fax)   Reason for call:  Patient requesting metoprolol succinate (TOPROL-XL) 100 MG 24 hr tablet and IBU 800 MG tablet, patient informed please allow 48 to 72 hour turn around time

## 2017-08-28 NOTE — Telephone Encounter (Signed)
pls advise

## 2017-09-01 NOTE — Telephone Encounter (Signed)
Patient is calling for a update in regards to her Metoprolol, she stated she needs this in regards to her blood pressure. She is requesting a nurse to call back.please advise

## 2017-09-03 ENCOUNTER — Encounter: Payer: Self-pay | Admitting: Family Medicine

## 2017-09-03 ENCOUNTER — Other Ambulatory Visit: Payer: Self-pay | Admitting: Family Medicine

## 2017-09-03 NOTE — Telephone Encounter (Signed)
Patient calling to check on the status of her refill. States that she has 2 pills left of her metoprolol. Would like to get this done before the weekend. States she has been waiting on this for 2 weeks. Please advise.

## 2017-09-03 NOTE — Telephone Encounter (Signed)
Contact pt to advise on what is needed for a Rx refill

## 2017-09-05 NOTE — Telephone Encounter (Signed)
Patient called and states that she is at work today and when the nurse calls to leave a voicemail on her number at home.  CB# [phone]

## 2017-09-07 ENCOUNTER — Other Ambulatory Visit: Payer: Self-pay

## 2017-09-07 MED ORDER — IBUPROFEN 800 MG PO TABS: 800.0000 mg | ORAL_TABLET | Freq: Three times a day (TID) | ORAL | 0 refills | Status: DC | PRN

## 2017-09-07 MED ORDER — METOPROLOL SUCCINATE ER 100 MG PO TB24: 100.0000 mg | ORAL_TABLET | Freq: Every day | ORAL | 0 refills | Status: DC

## 2017-09-27 ENCOUNTER — Other Ambulatory Visit: Payer: Self-pay | Admitting: Family Medicine

## 2017-09-30 NOTE — Telephone Encounter (Signed)
Drisdol refill Last Refill:05/07/17 # 40 with 1 refill Last OV: 08/21/17 PCP: Dr. Brigitte Pulse [facility] [address]

## 2017-10-24 LAB — HM DIABETES EYE EXAM

## 2017-10-25 ENCOUNTER — Other Ambulatory Visit: Payer: Self-pay | Admitting: Family Medicine

## 2017-10-25 DIAGNOSIS — H60543 Acute eczematoid otitis externa, bilateral: Secondary | ICD-10-CM | POA: Insufficient documentation

## 2017-10-25 MED ORDER — METOPROLOL SUCCINATE ER 100 MG PO TB24: 100.0000 mg | ORAL_TABLET | Freq: Every day | ORAL | 3 refills | Status: DC

## 2017-10-25 MED ORDER — LOSARTAN POTASSIUM-HCTZ 100-12.5 MG PO TABS: 1.0000 | ORAL_TABLET | Freq: Every day | ORAL | 3 refills | Status: DC

## 2017-10-25 NOTE — Telephone Encounter (Signed)
Refilled - see other notes [facility]

## 2017-10-25 NOTE — Telephone Encounter (Signed)
Yes, correct to cont on current regimen as I prev noted. Went ahead and sent in new rx for metoprolol to extend [facility] a [facility]

## 2017-10-25 NOTE — Telephone Encounter (Signed)
Appreciated nurse who sent in refill on metoprolol and ibuprofen as she noted that in prior OV note I stated to please refill and cont on all current medical regimen.  [facility] in additional [facility]

## 2017-10-26 DIAGNOSIS — E538 Deficiency of other specified B group vitamins: Secondary | ICD-10-CM | POA: Insufficient documentation

## 2017-10-26 MED ORDER — PRAVASTATIN SODIUM 20 MG PO TABS: 20.0000 mg | ORAL_TABLET | Freq: Every day | ORAL | 0 refills | Status: DC

## 2017-10-26 MED ORDER — ERGOCALCIFEROL 1.25 MG (50000 UT) PO CAPS: 50000.0000 [IU] | ORAL_CAPSULE | ORAL | 1 refills | Status: DC

## 2017-10-26 MED ORDER — CYCLOBENZAPRINE HCL 10 MG PO TABS: 10.0000 mg | ORAL_TABLET | Freq: Every day | ORAL | 1 refills | Status: AC

## 2017-10-31 ENCOUNTER — Ambulatory Visit (INDEPENDENT_AMBULATORY_CARE_PROVIDER_SITE_OTHER): Payer: Managed Care, Other (non HMO) | Admitting: Family Medicine

## 2017-10-31 DIAGNOSIS — Z23 Encounter for immunization: Secondary | ICD-10-CM

## 2017-11-14 ENCOUNTER — Telehealth: Payer: Self-pay | Admitting: Family Medicine

## 2017-11-14 NOTE — Telephone Encounter (Signed)
LVM for pt regarding their appt with Dr. Brigitte Pulse on 12/18/17. We are trying to move their appt from 3:40 to 3:30 due to the template changes on [facility] schedule.  I advised pt to call to the office and let us know if that time will work for them.   If pt is agreeable to that time, no further action is needed except for putting it in the appt notes.   Thank you!

## 2017-11-21 NOTE — Telephone Encounter (Signed)
Patient is ok with 3:30 P change with Dr Brigitte Pulse

## 2017-12-07 ENCOUNTER — Other Ambulatory Visit: Payer: Self-pay | Admitting: Family Medicine

## 2017-12-08 NOTE — Telephone Encounter (Signed)
Requested medication (s) are due for refill today: yes  Requested medication (s) are on the active medication list: yes  Last refill:  09/07/17 for 270 tabs  Future visit scheduled: yes  Notes to clinic:  NSAIDS failed. Need lab for hgb.  Requested Prescriptions  Pending Prescriptions Disp Refills   ibuprofen (ADVIL,MOTRIN) 800 MG tablet [Pharmacy Med Name: IBUPROFEN 800 MG TABLET] 270 tablet 0    Sig: Take 1 tablet (800 mg total) by mouth 3 (three) times daily as needed.     Analgesics:  NSAIDS Failed - 12/07/2017 10:11 AM      Failed - HGB in normal range and within 360 days    Hemoglobin  Date Value Ref Range Status  10/17/2016 13.8 11.1 - 15.9 g/dL Final         Passed - Cr in normal range and within 360 days    Creat  Date Value Ref Range Status  08/24/2015 0.65 0.50 - 1.05 mg/dL Final    Comment:      For patients > or = 62 years of age: The upper reference limit for Creatinine is approximately 13% higher for people identified as African-American.      Creatinine, Ser  Date Value Ref Range Status  08/21/2017 0.61 0.57 - 1.00 mg/dL Final         Passed - Patient is not pregnant      Passed - Valid encounter within last 12 months    Recent Outpatient Visits          1 month ago Need for immunization against influenza   [facility], Lilia Argue, MD   3 months ago Uncontrolled type 2 diabetes mellitus with hyperglycemia [facility])   [facility] at [facility], Laurey Arrow, MD   7 months ago Uncontrolled type 2 diabetes mellitus with hyperglycemia [facility])   [facility], Laurey Arrow, MD   10 months ago Uncontrolled type 2 diabetes mellitus with hyperglycemia [facility])   [facility], Laurey Arrow, MD   1 year ago Annual physical exam   Primary Care at [facility], Laurey Arrow, MD      Future Appointments            In 1 week Shawnee Knapp, MD Primary Care at [place], [facility]

## 2017-12-18 ENCOUNTER — Other Ambulatory Visit: Payer: Self-pay

## 2017-12-18 ENCOUNTER — Encounter: Payer: Self-pay | Admitting: Family Medicine

## 2017-12-18 ENCOUNTER — Ambulatory Visit (INDEPENDENT_AMBULATORY_CARE_PROVIDER_SITE_OTHER): Payer: Managed Care, Other (non HMO)

## 2017-12-18 ENCOUNTER — Ambulatory Visit: Payer: Managed Care, Other (non HMO) | Admitting: Family Medicine

## 2017-12-18 VITALS — BP 126/82 | HR 80 | Temp 98.0°F | Resp 16 | Wt 236.0 lb

## 2017-12-18 DIAGNOSIS — E538 Deficiency of other specified B group vitamins: Secondary | ICD-10-CM | POA: Diagnosis not present

## 2017-12-18 DIAGNOSIS — G8929 Other chronic pain: Secondary | ICD-10-CM

## 2017-12-18 DIAGNOSIS — R222 Localized swelling, mass and lump, trunk: Secondary | ICD-10-CM | POA: Diagnosis not present

## 2017-12-18 DIAGNOSIS — E1165 Type 2 diabetes mellitus with hyperglycemia: Secondary | ICD-10-CM | POA: Diagnosis not present

## 2017-12-18 DIAGNOSIS — M25511 Pain in right shoulder: Secondary | ICD-10-CM

## 2017-12-18 DIAGNOSIS — Z1231 Encounter for screening mammogram for malignant neoplasm of breast: Secondary | ICD-10-CM

## 2017-12-18 DIAGNOSIS — M898X1 Other specified disorders of bone, shoulder: Secondary | ICD-10-CM

## 2017-12-18 DIAGNOSIS — R062 Wheezing: Secondary | ICD-10-CM

## 2017-12-18 DIAGNOSIS — Z79899 Other long term (current) drug therapy: Secondary | ICD-10-CM

## 2017-12-18 DIAGNOSIS — E559 Vitamin D deficiency, unspecified: Secondary | ICD-10-CM

## 2017-12-18 LAB — POCT GLYCOSYLATED HEMOGLOBIN (HGB A1C): HEMOGLOBIN A1C: 6.8 % — AB (ref 4.0–5.6)

## 2017-12-18 MED ORDER — ALBUTEROL SULFATE (2.5 MG/3ML) 0.083% IN NEBU
2.5000 mg | INHALATION_SOLUTION | Freq: Once | RESPIRATORY_TRACT | Status: AC
  Administered 2017-12-18: 2.5 mg via RESPIRATORY_TRACT

## 2017-12-18 MED ORDER — CYANOCOBALAMIN 1000 MCG/ML IJ SOLN
1000.0000 ug | Freq: Once | INTRAMUSCULAR | Status: AC
  Administered 2017-12-18: 1000 ug via INTRAMUSCULAR
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
## 2017-12-18 NOTE — Patient Instructions (Signed)
° ° ° °  If you have lab work done today you will be contacted with your lab results within the next 2 weeks.  If you have not heard from us then please contact us. The fastest way to get your results is to register for My Chart. ° ° °IF you received an x-ray today, you will receive an invoice from [facility]. Please contact [facility] at [phone] with questions or concerns regarding your invoice.  ° °IF you received labwork today, you will receive an invoice from [facility]. Please contact [facility] at [phone] with questions or concerns regarding your invoice.  ° °Our billing staff will not be able to assist you with questions regarding bills from these companies. ° °You will be contacted with the lab results as soon as they are available. The fastest way to get your results is to activate your My Chart account. Instructions are located on the last page of this paperwork. If you have not heard from us regarding the results in 2 weeks, please contact this office. °  ° ° ° °

## 2017-12-19 LAB — COMPREHENSIVE METABOLIC PANEL
ALBUMIN: 4.1 g/dL (ref 3.6–4.8)
ALK PHOS: 76 IU/L (ref 39–117)
ALT: 23 IU/L (ref 0–32)
AST: 22 IU/L (ref 0–40)
Albumin/Globulin Ratio: 1.5 (ref 1.2–2.2)
BUN / CREAT RATIO: 22 (ref 12–28)
BUN: 15 mg/dL (ref 8–27)
Bilirubin Total: <0.2 mg/dL (ref 0.0–1.2)
CHLORIDE: 100 mmol/L (ref 96–106)
CO2: 21 mmol/L (ref 20–29)
Calcium: 9.7 mg/dL (ref 8.7–10.3)
Creatinine, Ser: 0.67 mg/dL (ref 0.57–1.00)
GFR calc Af Amer: 110 mL/min/{1.73_m2} (ref >59)
GFR calc non Af Amer: 95 mL/min/{1.73_m2} (ref >59)
GLOBULIN, TOTAL: 2.8 g/dL (ref 1.5–4.5)
Glucose: 162 mg/dL — ABNORMAL HIGH (ref 65–99)
POTASSIUM: 4 mmol/L (ref 3.5–5.2)
SODIUM: 141 mmol/L (ref 134–144)
Total Protein: 6.9 g/dL (ref 6.0–8.5)

## 2017-12-19 LAB — CBC
HEMATOCRIT: 38.9 % (ref 34.0–46.6)
Hemoglobin: 12.8 g/dL (ref 11.1–15.9)
MCH: 29 pg (ref 26.6–33.0)
MCHC: 32.9 g/dL (ref 31.5–35.7)
MCV: 88 fL (ref 79–97)
PLATELETS: 356 10*3/uL (ref 150–450)
RBC: 4.42 x10E6/uL (ref 3.77–5.28)
RDW: 12.8 % (ref 12.3–15.4)
WBC: 10.2 10*3/uL (ref 3.4–10.8)

## 2017-12-19 LAB — VITAMIN B12: Vitamin B-12: >2000 pg/mL — ABNORMAL HIGH (ref 232–1245)

## 2017-12-19 LAB — VITAMIN D 25 HYDROXY (VIT D DEFICIENCY, FRACTURES): Vit D, 25-Hydroxy: 43.4 ng/mL (ref 30.0–100.0)

## 2017-12-20 ENCOUNTER — Telehealth: Payer: Self-pay | Admitting: Family Medicine

## 2017-12-20 DIAGNOSIS — R05 Cough: Secondary | ICD-10-CM

## 2017-12-20 DIAGNOSIS — R062 Wheezing: Secondary | ICD-10-CM

## 2017-12-20 DIAGNOSIS — R059 Cough, unspecified: Secondary | ICD-10-CM

## 2017-12-20 NOTE — Telephone Encounter (Signed)
Pt calling inquiring if the three medications she asserted she was supposed to receive from the doctor would be prescribed. The pt. Could not list the medications but was certain Dr. Brigitte Pulse had said that she would prescribe three after the visit.   Please advise

## 2017-12-29 NOTE — Telephone Encounter (Signed)
Patient is calling to check on the status of her medication.

## 2017-12-30 NOTE — Telephone Encounter (Signed)
Left voicemail, message will be sent to Dr. Brigitte Pulse to determine if she is aware of medications to be prescribed. pls see note.  thanks

## 2018-01-12 ENCOUNTER — Other Ambulatory Visit: Payer: Self-pay | Admitting: Family Medicine

## 2018-01-13 NOTE — Telephone Encounter (Signed)
Pt states the medicine she is needing is ranitidine (ZANTAC) 150 MG tablet  And she is now out of medication. Please advise.

## 2018-01-17 ENCOUNTER — Other Ambulatory Visit: Payer: Self-pay | Admitting: Family Medicine

## 2018-01-19 NOTE — Telephone Encounter (Signed)
pls see med request. Pt last given 01/16/17 # 30. Pt has appointment scheduled for 04/18/18 @ 9:00 am

## 2018-01-19 NOTE — Telephone Encounter (Signed)
Requested medication (s) are due for refill today: Yes  Requested medication (s) are on the active medication list: Yes  Last refill:  01/16/17  Future visit scheduled: Yes  Notes to clinic:  See request    Requested Prescriptions  Pending Prescriptions Disp Refills   metaxalone (SKELAXIN) 800 MG tablet [Pharmacy Med Name: METAXALONE 800 MG TABLET] 30 tablet 3    Sig: TAKE 1 TABLET BY MOUTH THREE TIMES A DAY     Not Delegated - Analgesics:  Muscle Relaxants Failed - 01/17/2018  8:25 AM      Failed - This refill cannot be delegated      Passed - Valid encounter within last 6 months    Recent Outpatient Visits          1 month ago Vitamin B12 deficiency   [facility] at [facility], Laurey Arrow, MD   2 months ago Need for immunization against influenza   [facility] at [facility], Lilia Argue, MD   5 months ago Uncontrolled type 2 diabetes mellitus with hyperglycemia [facility])   [facility] at [facility], Laurey Arrow, MD   9 months ago Uncontrolled type 2 diabetes mellitus with hyperglycemia [facility])   [facility] at [facility], Laurey Arrow, MD   1 year ago Uncontrolled type 2 diabetes mellitus with hyperglycemia [facility])   Primary Care at [facility], Laurey Arrow, MD      Future Appointments            In 2 months Shawnee Knapp, MD Primary Care at [place], [facility]         Signed Prescriptions Disp Refills   pravastatin (PRAVACHOL) 20 MG tablet 90 tablet 0    Sig: TAKE 1 TABLET BY MOUTH EVERY DAY     Cardiovascular:  Antilipid - Statins Failed - 01/17/2018  8:25 AM      Failed - HDL in normal range and within 360 days    HDL  Date Value Ref Range Status  08/21/2017 33 (L) >39 mg/dL Final         Failed - Triglycerides in normal range and within 360 days    Triglycerides  Date Value Ref Range Status  08/21/2017 298 (H) 0 - 149 mg/dL Final         Passed - Total Cholesterol in normal range and within 360 days    Cholesterol, Total  Date Value Ref Range Status  08/21/2017 161 100 - 199 mg/dL  Final         Passed - LDL in normal range and within 360 days    LDL Calculated  Date Value Ref Range Status  08/21/2017 68 0 - 99 mg/dL Final         Passed - Patient is not pregnant      Passed - Valid encounter within last 12 months    Recent Outpatient Visits          1 month ago Vitamin B12 deficiency   [facility] at [facility], Laurey Arrow, MD   2 months ago Need for immunization against influenza   [facility] at [facility], Lilia Argue, MD   5 months ago Uncontrolled type 2 diabetes mellitus with hyperglycemia [facility])   [facility] at [facility], Laurey Arrow, MD   9 months ago Uncontrolled type 2 diabetes mellitus with hyperglycemia [facility])   Primary Care at [facility], Laurey Arrow, MD   1 year ago Uncontrolled type 2 diabetes mellitus with hyperglycemia ([place])  Primary Care at [place], MD      Future Appointments            In 2 months Shawnee Knapp, MD Primary Care at [place], [facility]

## 2018-02-04 NOTE — Telephone Encounter (Signed)
Patient states that when she was in the office on 12/18/2017 that she told Dr Brigitte Pulse that Zantac had effects of cancer symptoms. She advised the patient that she would call her in something else in place of this and as of 02/04/2018 that has not been done per patient. Patient state she is disgusted with the office and how she is being treated. Patient is now out of the medication and is having stomach problems. I called to the office and spoke with Solmon Ice that advised me that Wilfred Curtis had gone home for the day and advised me to let the patient know she would get a phone call tomorrow. Patient states she can not answer her phone tomorrow because she will be working but to please leave a detailed message on what will happen and she will call back. [phone]

## 2018-02-05 NOTE — Telephone Encounter (Signed)
pls see note 

## 2018-02-06 ENCOUNTER — Telehealth: Payer: Self-pay | Admitting: Family Medicine

## 2018-02-06 NOTE — Telephone Encounter (Signed)
Please advise 

## 2018-02-06 NOTE — Telephone Encounter (Signed)
Copied from [place] [id number]. Topic: General - Other >> Feb 05, 2018  5:38 PM Yvette Rack wrote: Reason for CRM: Patient called once again stating that she is out of the medication and having stomach problems. Patient is very upset that she did not receive a call today after being told someone would call her. Patient requests that someone call her back and leave a detailed message on what will happen with the medication. Patient stated she will be at work until 5:30 pm so she will not be able to answer her phone. Cb# [phone]

## 2018-02-11 MED ORDER — ALBUTEROL SULFATE HFA 108 (90 BASE) MCG/ACT IN AERS: 2.0000 | INHALATION_SPRAY | RESPIRATORY_TRACT | 1 refills | Status: DC | PRN

## 2018-02-11 MED ORDER — METFORMIN HCL ER (OSM) 1000 MG PO TB24: ORAL_TABLET | ORAL | 3 refills | Status: DC

## 2018-02-11 MED ORDER — LORATADINE 10 MG PO TABS: 10.0000 mg | ORAL_TABLET | Freq: Every day | ORAL | 11 refills | Status: DC

## 2018-02-11 MED ORDER — MONTELUKAST SODIUM 10 MG PO TABS: 10.0000 mg | ORAL_TABLET | Freq: Every day | ORAL | 3 refills | Status: AC

## 2018-02-11 MED ORDER — PANTOPRAZOLE SODIUM 40 MG PO TBEC: 40.0000 mg | DELAYED_RELEASE_TABLET | Freq: Every evening | ORAL | 1 refills | Status: AC

## 2018-02-11 MED ORDER — TRAMADOL HCL 50 MG PO TABS: 100.0000 mg | ORAL_TABLET | Freq: Three times a day (TID) | ORAL | 2 refills | Status: DC | PRN

## 2018-02-11 MED ORDER — FAMOTIDINE 20 MG PO TABS: 20.0000 mg | ORAL_TABLET | Freq: Two times a day (BID) | ORAL | 3 refills | Status: DC | PRN

## 2018-02-11 NOTE — Addendum Note (Signed)
Addended by: Shawnee Knapp on: 02/11/2018 02:21 AM   Modules accepted: Orders

## 2018-02-11 NOTE — Telephone Encounter (Signed)
Pepcid (famotidine) has been [facility] in place of the ranitidine (zantac). FYI - this is avail otc if she ever needs urgently (just as ranitidine is) in the future but I sent in a year of it so should be covered for now.

## 2018-02-11 NOTE — Telephone Encounter (Signed)
Her refills have been sent to [facility] Famotidine (Pepcid) was sent in to replace the ranitidine (Zantac).  Meds ordered this encounter  Medications  . albuterol (PROVENTIL HFA;VENTOLIN HFA) 108 (90 Base) MCG/ACT inhaler    Sig: Inhale 2 puffs into the lungs every 4 (four) hours as needed for wheezing or shortness of breath.    Dispense:  1 Inhaler    Refill:  1  . loratadine (CLARITIN) 10 MG tablet    Sig: Take 1 tablet (10 mg total) by mouth daily.    Dispense:  30 tablet    Refill:  11  . metformin (FORTAMET) 1000 MG (OSM) 24 hr tablet    Sig: TAKE 2 TABLETS (2,000 MG TOTAL) AT BEDTIME    Dispense:  180 tablet    Refill:  3  . montelukast (SINGULAIR) 10 MG tablet    Sig: Take 1 tablet (10 mg total) by mouth at bedtime.    Dispense:  90 tablet    Refill:  3  . pantoprazole (PROTONIX) 40 MG tablet    Sig: Take 1 tablet (40 mg total) by mouth every evening.    Dispense:  90 tablet    Refill:  1  . traMADol (ULTRAM) 50 MG tablet    Sig: Take 2 tablets (100 mg total) by mouth every 8 (eight) hours as needed.    Dispense:  180 tablet    Refill:  2

## 2018-02-12 NOTE — Telephone Encounter (Signed)
Informed pt of RX sent to pharmacy.

## 2018-02-13 ENCOUNTER — Ambulatory Visit
Admission: RE | Admit: 2018-02-13 | Discharge: 2018-02-13 | Disposition: A | Discharge status: Home / Self Care | Payer: Managed Care, Other (non HMO) | Source: Ambulatory Visit | Attending: Family Medicine | Admitting: Family Medicine

## 2018-02-13 DIAGNOSIS — Z1231 Encounter for screening mammogram for malignant neoplasm of breast: Secondary | ICD-10-CM

## 2018-02-28 ENCOUNTER — Telehealth: Payer: Self-pay | Admitting: Family Medicine

## 2018-02-28 NOTE — Telephone Encounter (Signed)
Pt is wanting test strips  (one touch Vero strips ) doesnt want to pay out of pocket.[facility] currently has some that are outdated wont fit in machine. Please

## 2018-03-02 ENCOUNTER — Other Ambulatory Visit: Payer: Self-pay

## 2018-03-10 ENCOUNTER — Other Ambulatory Visit: Payer: Self-pay

## 2018-03-16 ENCOUNTER — Telehealth: Payer: Self-pay | Admitting: Family Medicine

## 2018-03-16 NOTE — Telephone Encounter (Signed)
Requested medication (s) are due for refill today: Yes  Requested medication (s) are on the active medication list: Yes  Last refill:  07/15/16  Future visit scheduled: Yes  Notes to clinic:  Expired, unable to refill.     Requested Prescriptions  Pending Prescriptions Disp Refills   blood glucose meter kit and supplies KIT 1 each 0    Sig: Dispense based on patient and insurance preference. Use up to four times daily as directed. (FOR ICD-9 250.00, 250.01).     Endocrinology: Diabetes - Testing Supplies Passed - 03/16/2018  4:28 PM      Passed - Valid encounter within last 12 months    Recent Outpatient Visits          2 months ago Vitamin B12 deficiency   [facility], Laurey Arrow, MD   4 months ago Need for immunization against influenza   [facility], Lilia Argue, MD   6 months ago Uncontrolled type 2 diabetes mellitus with hyperglycemia [facility])   [facility], Laurey Arrow, MD   11 months ago Uncontrolled type 2 diabetes mellitus with hyperglycemia [facility])   [facility], Laurey Arrow, MD   1 year ago Uncontrolled type 2 diabetes mellitus with hyperglycemia [facility])   [facility], Laurey Arrow, MD      Future Appointments            In 1 month Sagardia, Ines Bloomer, MD Primary Care at [place], [facility]

## 2018-03-16 NOTE — Telephone Encounter (Signed)
Copied from [place]. Topic: Quick Communication - Rx Refill/Question >> Mar 16, 2018  4:07 PM Jeri Cos wrote: Medication: blood glucose meter kit and supplies KIT     Has the patient contacted their pharmacy? Yes.    (Agent: If yes, when and what did the pharmacy advise?) Pt stated the pharmacy does not have a prescription on file and they will not contact the office.   Preferred Pharmacy (with phone number or street name): [id number] [place] [phone](Phone) [phone](Fax)    Agent: Please be advised that RX refills may take up to 3 business days. We ask that you follow-up with your pharmacy.

## 2018-03-16 NOTE — Telephone Encounter (Signed)
LVM for pt to call office and reschedule. Due to Dr. Brigitte Pulse being out of the office, pt will need to be reschedule. When pt calls back, please reschedule appt from 04/18/18 at their convenience. Thank you!

## 2018-03-17 MED ORDER — BLOOD GLUCOSE MONITOR KIT: PACK | 0 refills | Status: AC

## 2018-03-24 NOTE — Telephone Encounter (Signed)
Pt requesting a callback regarding test strips. Please advise. [phone]

## 2018-03-24 NOTE — Telephone Encounter (Signed)
Per medication list- test strips and lancets not called to pharmacy. Patient is requesting these sent to pharmacy.

## 2018-03-24 NOTE — Telephone Encounter (Signed)
Pt stated that the pharmacy never received rx for glucose meter and supplies on 03/17/18. Pt stated she doesn't need glucose meter but she needs test strips. She has not had any test strips since December 2019. Please advise.  [id number] [address], [place] [phone] (Phone) [phone] (Fax)

## 2018-03-30 ENCOUNTER — Other Ambulatory Visit: Payer: Self-pay

## 2018-03-30 DIAGNOSIS — E1165 Type 2 diabetes mellitus with hyperglycemia: Secondary | ICD-10-CM

## 2018-03-30 MED ORDER — LANCETS MISC. MISC: 1 refills | Status: AC

## 2018-03-30 MED ORDER — GLUCOSE BLOOD VI STRP: ORAL_STRIP | 1 refills | Status: AC

## 2018-03-30 NOTE — Telephone Encounter (Signed)
Spoke with flow, Brandy, who confirmed that a cma, Vaughan Basta, will call in the test strips today.

## 2018-04-08 ENCOUNTER — Telehealth: Payer: Self-pay | Admitting: Family Medicine

## 2018-04-08 NOTE — Telephone Encounter (Signed)
Copied from [place] [id number]. Topic: Quick Communication - Rx Refill/Question >> Apr 08, 2018  5:47 PM Gustavus Messing wrote: Medication: BYDUREON BCISE 2 MG/0.85ML AUIJ   Has the patient contacted their pharmacy? Yes.   (Agent: If yes, when and what did the pharmacy advise?) Patient does not have refills. Needs a new prescription  Preferred Pharmacy (with phone number or street name): [id number] [place], [place] [phone] (Phone) [phone] (Fax)    Agent: Please be advised that RX refills may take up to 3 business days. We ask that you follow-up with your pharmacy.

## 2018-04-08 NOTE — Telephone Encounter (Signed)
Medication: BYDUREON BCISE 2 MG/0.85ML AUIJ   Patient does not have refills. Needs a new prescription  [id number] [address], [place] [phone] (Phone) [phone] (Fax)  Pt has appt 04/21/2018 with Dr. Mitchel Honour

## 2018-04-09 ENCOUNTER — Other Ambulatory Visit: Payer: Self-pay | Admitting: Family Medicine

## 2018-04-09 ENCOUNTER — Telehealth: Payer: Self-pay | Admitting: Family Medicine

## 2018-04-09 MED ORDER — EXENATIDE ER 2 MG/0.85ML ~~LOC~~ AUIJ: 2.0000 mg | AUTO-INJECTOR | SUBCUTANEOUS | 0 refills | Status: DC

## 2018-04-09 NOTE — Telephone Encounter (Signed)
° ° ° °  Pt is following up on her req for her refill she will need   BYDUREON BCISE 2 MG/0.85ML AUIJ

## 2018-04-09 NOTE — Telephone Encounter (Signed)
Copied from [place] [id number]. Topic: General - Other >> Apr 09, 2018 10:39 AM Carolyn Stare wrote:  Pt following up on her refill req will need this med by Sat   Pt is following up on her req for her refill she will need   BYDUREON BCISE 2 MG/0.85ML AUIJ

## 2018-04-09 NOTE — Telephone Encounter (Signed)
Rx sent 

## 2018-04-09 NOTE — Telephone Encounter (Signed)
Message has been sent to Dr. Kaleen Mask for review.

## 2018-04-09 NOTE — Telephone Encounter (Signed)
Pt has been informed of RX sent to pharmacy, she verbalized understanding.

## 2018-04-16 ENCOUNTER — Other Ambulatory Visit: Payer: Self-pay | Admitting: Family Medicine

## 2018-04-16 NOTE — Telephone Encounter (Signed)
Requested Prescriptions  Pending Prescriptions Disp Refills  . pravastatin (PRAVACHOL) 20 MG tablet [Pharmacy Med Name: PRAVASTATIN SODIUM 20 MG TAB] 90 tablet 0    Sig: TAKE 1 TABLET BY MOUTH EVERY DAY     Cardiovascular:  Antilipid - Statins Failed - 04/16/2018  1:56 AM      Failed - HDL in normal range and within 360 days    HDL  Date Value Ref Range Status  08/21/2017 33 (L) >39 mg/dL Final         Failed - Triglycerides in normal range and within 360 days    Triglycerides  Date Value Ref Range Status  08/21/2017 298 (H) 0 - 149 mg/dL Final         Passed - Total Cholesterol in normal range and within 360 days    Cholesterol, Total  Date Value Ref Range Status  08/21/2017 161 100 - 199 mg/dL Final         Passed - LDL in normal range and within 360 days    LDL Calculated  Date Value Ref Range Status  08/21/2017 68 0 - 99 mg/dL Final         Passed - Patient is not pregnant      Passed - Valid encounter within last 12 months    Recent Outpatient Visits          3 months ago Vitamin B12 deficiency   [facility], Laurey Arrow, MD   5 months ago Need for immunization against influenza   [facility], Lilia Argue, MD   7 months ago Uncontrolled type 2 diabetes mellitus with hyperglycemia [facility])   [facility], Laurey Arrow, MD   12 months ago Uncontrolled type 2 diabetes mellitus with hyperglycemia [facility])   [facility], Laurey Arrow, MD   1 year ago Uncontrolled type 2 diabetes mellitus with hyperglycemia [facility])   [facility], Laurey Arrow, MD      Future Appointments            In 5 days Sagardia, Ines Bloomer, MD Primary Care at [place], [facility]

## 2018-04-18 ENCOUNTER — Ambulatory Visit: Payer: Managed Care, Other (non HMO) | Admitting: Family Medicine

## 2018-04-18 ENCOUNTER — Other Ambulatory Visit: Payer: Self-pay

## 2018-04-18 DIAGNOSIS — E1165 Type 2 diabetes mellitus with hyperglycemia: Secondary | ICD-10-CM

## 2018-04-21 ENCOUNTER — Telehealth (INDEPENDENT_AMBULATORY_CARE_PROVIDER_SITE_OTHER): Payer: BC Managed Care – PPO | Admitting: Emergency Medicine

## 2018-04-21 ENCOUNTER — Telehealth: Payer: Self-pay | Admitting: *Deleted

## 2018-04-21 DIAGNOSIS — G894 Chronic pain syndrome: Secondary | ICD-10-CM | POA: Diagnosis not present

## 2018-04-21 DIAGNOSIS — E890 Postprocedural hypothyroidism: Secondary | ICD-10-CM

## 2018-04-21 DIAGNOSIS — I10 Essential (primary) hypertension: Secondary | ICD-10-CM

## 2018-04-21 DIAGNOSIS — E1159 Type 2 diabetes mellitus with other circulatory complications: Secondary | ICD-10-CM

## 2018-04-21 DIAGNOSIS — E1165 Type 2 diabetes mellitus with hyperglycemia: Secondary | ICD-10-CM | POA: Diagnosis not present

## 2018-04-21 MED ORDER — FLUOCINOLONE ACETONIDE 0.01 % OT OIL: 5.0000 [drp] | TOPICAL_OIL | Freq: Two times a day (BID) | OTIC | 5 refills | Status: AC | PRN

## 2018-04-21 MED ORDER — TRAMADOL HCL 50 MG PO TABS: 50.0000 mg | ORAL_TABLET | Freq: Three times a day (TID) | ORAL | 0 refills | Status: AC | PRN

## 2018-04-21 NOTE — Telephone Encounter (Signed)
Spoke to patient after Telemed visit to schedule Nurse visit for lab work only. Appointment is 04/23/2018 at 1400, future labs have been ordered.

## 2018-04-21 NOTE — Addendum Note (Signed)
Addended by: Davina Poke on: 04/21/2018 02:53 PM   Modules accepted: Orders

## 2018-04-21 NOTE — Progress Notes (Addendum)
Telemedicine Encounter- SOAP NOTE Established Patient  This telephone encounter was conducted with the patient's (or proxy's) verbal consent via audio telecommunications: yes/no: Yes Patient was instructed to have this encounter in a suitably private space; and to only have persons present to whom they give permission to participate. In addition, patient identity was confirmed by use of name plus two identifiers (DOB and address).  I discussed the limitations, risks, security and privacy concerns of performing an evaluation and management service by telephone and the availability of in person appointments. I also discussed with the patient that there may be a patient responsible charge related to this service. The patient expressed understanding and agreed to proceed.  I spent a total of TIME; 0 MIN TO 60 MIN: 20 minutes talking with the patient or their proxy.  No chief complaint on file. Chronic medical problems follow-up  Subjective   Donna [name] is a 63 y.o. female established patient of Dr. Brigitte Pulse.  First visit with me.  Telephone visit today for follow-up of chronic medical problems that include the following: 1.  Type 2 diabetes: Average glucose at home 160-170.  Due to recent stress she has neglected her diet a bit.  Thinks generalized stress is making her numbers worse. Lab Results  Component Value Date   HGBA1C 6.8 (A) 12/18/2017    2.  Hypertension: BP Readings from Last 3 Encounters:  12/18/17 126/82  08/21/17 119/77  04/17/17 120/78    3.  Chronic pain syndrome: Takes tramadol on a daily basis for over 5 years.  Has never seen chronic pain management specialist. 4.  Thyroid disease: Hypothyroidism Medications reviewed with patient. Has no complaints or medical concerns today. HPI   Patient Active Problem List   Diagnosis Date Noted   Vitamin B12 deficiency 10/26/2017   High risk medications (not anticoagulants) long-term use 05/07/2017   Chronic pain  syndrome 05/07/2017   Chronic bilateral low back pain without sciatica 08/23/2015   Allergic rhinitis due to pollen 01/10/2015   Type II diabetes mellitus, uncontrolled ([place]) 03/06/2014   Multinodular goiter 03/06/2014   Fatty infiltration of liver 01/26/2014   Hx of total thyroidectomy with radical neck dissection 11/12/2013   Diverticulitis large intestine 10/14/2013   Other and unspecified ovarian cyst 09/29/2013   Postmenopause 09/29/2013   Essential hypertension, benign 09/03/2013   Postoperative hypothyroidism 09/03/2013   Vitamin D deficiency 09/03/2013    Past Medical History:  Diagnosis Date   Allergy    Arthritis    Diabetes mellitus without complication ([place])    Difficulty sleeping    Diverticulitis    Endometrial cancer ([place]) 10/2013   grade 1 endometrioid adenocarcinoma, s/p total hysterectomy w/ BSO, lymph node biopsy, LOA for treatment by Dr. Denman George   GERD (gastroesophageal reflux disease)    History of bronchitis    History of gout    Hypertension    Hypothyroid    Skin cancer (melanoma) ([place])    to be removed 12/09/13    Current Outpatient Medications  Medication Sig Dispense Refill   acetic acid-hydrocortisone (VOSOL-HC) OTIC solution Place 5 drops into both ears 2 (two) times daily. 10 mL 3   albuterol (PROVENTIL HFA;VENTOLIN HFA) 108 (90 Base) MCG/ACT inhaler Inhale 2 puffs into the lungs every 4 (four) hours as needed for wheezing or shortness of breath. 1 Inhaler 1   blood glucose meter kit and supplies KIT Dispense based on patient and insurance preference. Use up to four times daily as directed. (FOR  ICD-9 250.00, 250.01). 1 each 0   cyclobenzaprine (FLEXERIL) 10 MG tablet Take 1 tablet (10 mg total) by mouth at bedtime. 90 tablet 1   Exenatide ER (BYDUREON BCISE) 2 MG/0.85ML AUIJ Inject 2 mg into the skin once a week. 12 pen 0   famotidine (PEPCID) 20 MG tablet Take 1 tablet (20 mg total) by mouth 2 (two) times daily as  needed for heartburn or indigestion. 180 tablet 3   Fluocinolone Acetonide 0.01 % OIL Place 5 drops in ear(s) 2 (two) times daily as needed ("eczema in ears"). 20 mL 5   glucose blood test strip E11.9. Dispense type to match meter. Use bid to check sugars. Use as instructed 100 each 1   ibuprofen (IBU) 800 MG tablet Take 1 tablet (800 mg total) by mouth 3 (three) times daily as needed. 270 tablet 0   ipratropium (ATROVENT) 0.06 % nasal spray Place 2 sprays into both nostrils 3 (three) times daily as needed for rhinitis. Reported on 03/23/2015     Lancets Misc. MISC E11.9 Dispense type to match meter. 100 each 1   loratadine (CLARITIN) 10 MG tablet Take 1 tablet (10 mg total) by mouth daily. 30 tablet 11   losartan-hydrochlorothiazide (HYZAAR) 100-12.5 MG tablet Take 1 tablet by mouth daily. 90 tablet 3   metaxalone (SKELAXIN) 800 MG tablet TAKE 1 TABLET BY MOUTH THREE TIMES A DAY 30 tablet 3   metformin (FORTAMET) 1000 MG (OSM) 24 hr tablet TAKE 2 TABLETS (2,000 MG TOTAL) AT BEDTIME 180 tablet 3   metoprolol succinate (TOPROL-XL) 100 MG 24 hr tablet Take 1 tablet (100 mg total) by mouth daily. Take with or immediately following a meal. 90 tablet 3   montelukast (SINGULAIR) 10 MG tablet Take 1 tablet (10 mg total) by mouth at bedtime. 90 tablet 3   pantoprazole (PROTONIX) 40 MG tablet Take 1 tablet (40 mg total) by mouth every evening. 90 tablet 1   pravastatin (PRAVACHOL) 20 MG tablet TAKE 1 TABLET BY MOUTH EVERY DAY 90 tablet 0   sodium chloride (OCEAN) 0.65 % SOLN nasal spray Place 1 spray into both nostrils as needed for congestion. 1 Bottle 0   traMADol (ULTRAM) 50 MG tablet Take 2 tablets (100 mg total) by mouth every 8 (eight) hours as needed. 180 tablet 2   Vitamin D, Ergocalciferol, (DRISDOL) 1.25 MG (50000 UT) CAPS capsule TAKE 1 CAPSULE (50,000 UNITS TOTAL) BY MOUTH 3 (THREE) TIMES A WEEK. 36 capsule 2   No current facility-administered medications for this visit.      Allergies  Allergen Reactions   Glimepiride Other (See Comments)    Per patient causes severe gas pain   Morphine And Related Nausea And Vomiting   Colcrys [Colchicine] Rash   Lisinopril Rash    Social History   Socioeconomic History   Marital status: Single    Spouse name: Not on file   Number of children: Not on file   Years of education: Not on file   Highest education level: Not on file  Occupational History   Not on file  Social Needs   Financial resource strain: Not on file   Food insecurity:    Worry: Not on file    Inability: Not on file   Transportation needs:    Medical: Not on file    Non-medical: Not on file  Tobacco Use   Smoking status: Former Smoker    Last attempt to quit: 11/20/2007    Years since quitting: 57.4  Smokeless tobacco: Never Used  Substance and Sexual Activity   Alcohol use: No    Comment: occasional   Drug use: No   Sexual activity: Never  Lifestyle   Physical activity:    Days per week: Not on file    Minutes per session: Not on file   Stress: Not on file  Relationships   Social connections:    Talks on phone: Not on file    Gets together: Not on file    Attends religious service: Not on file    Active member of club or organization: Not on file    Attends meetings of clubs or organizations: Not on file    Relationship status: Not on file   Intimate partner violence:    Fear of current or ex partner: Not on file    Emotionally abused: Not on file    Physically abused: Not on file    Forced sexual activity: Not on file  Other Topics Concern   Not on file  Social History Narrative   Not on file    Review of Systems  Constitutional: Negative.  Negative for chills and fever.  HENT: Negative for congestion and sore throat.   Eyes: Negative for discharge and redness.  Respiratory: Negative for cough and shortness of breath.   Cardiovascular: Negative for chest pain and palpitations.   Gastrointestinal: Negative for abdominal pain, diarrhea, nausea and vomiting.  Musculoskeletal: Positive for back pain and joint pain. Negative for myalgias.  Skin: Negative.  Negative for rash.  Neurological: Negative for dizziness.  All other systems reviewed and are negative.   Objective   Vitals as reported by the patient: Does not take blood pressure at home. There were no vitals filed for this visit.  There are no diagnoses linked to this encounter.  Diagnoses and all orders for this visit:  Uncontrolled type 2 diabetes mellitus with hyperglycemia ([place])  Chronic pain syndrome -     Ambulatory referral to Pain Clinic -     traMADol (ULTRAM) 50 MG tablet; Take 1 tablet (50 mg total) by mouth every 8 (eight) hours as needed for severe pain.  Postoperative hypothyroidism  Essential hypertension, benign  Hypertension associated with diabetes ([place])  Other orders -     Fluocinolone Acetonide 0.01 % OIL; Place 5 drops in ear(s) 2 (two) times daily as needed ("eczema in ears").    Follow-up in the office in 3 to 6 months. Continue present medications. Continue monitoring home blood glucose.  Diet and nutrition advice given. Patient informed that her chronic pain will need to be handled by a chronic pain management clinic and not by me or anyone in this office.  She has been on opiates daily for several years.  I discussed the assessment and treatment plan with the patient. The patient was provided an opportunity to ask questions and all were answered. The patient agreed with the plan and demonstrated an understanding of the instructions.   The patient was advised to call back or seek an in-person evaluation if the symptoms worsen or if the condition fails to improve as anticipated.  I provided 20 minutes of non-face-to-face time during this encounter.  Horald Pollen, MD  Primary Care at [facility]

## 2018-04-23 ENCOUNTER — Ambulatory Visit (INDEPENDENT_AMBULATORY_CARE_PROVIDER_SITE_OTHER): Payer: BLUE CROSS/BLUE SHIELD | Admitting: Family Medicine

## 2018-04-23 ENCOUNTER — Other Ambulatory Visit: Payer: Self-pay

## 2018-04-23 DIAGNOSIS — E1165 Type 2 diabetes mellitus with hyperglycemia: Secondary | ICD-10-CM

## 2018-04-23 NOTE — Progress Notes (Signed)
Nurse visit

## 2018-04-24 LAB — LIPID PANEL
Chol/HDL Ratio: 3.8 ratio (ref 0.0–4.4)
Cholesterol, Total: 130 mg/dL (ref 100–199)
HDL: 34 mg/dL — ABNORMAL LOW (ref >39)
LDL Calculated: 40 mg/dL (ref 0–99)
Triglycerides: 280 mg/dL — ABNORMAL HIGH (ref 0–149)
VLDL Cholesterol Cal: 56 mg/dL — ABNORMAL HIGH (ref 5–40)

## 2018-04-24 LAB — COMPREHENSIVE METABOLIC PANEL
ALT: 33 IU/L — ABNORMAL HIGH (ref 0–32)
AST: 32 IU/L (ref 0–40)
Albumin/Globulin Ratio: 1.7 (ref 1.2–2.2)
Albumin: 4.4 g/dL (ref 3.8–4.8)
Alkaline Phosphatase: 73 IU/L (ref 39–117)
BUN/Creatinine Ratio: 21 (ref 12–28)
BUN: 13 mg/dL (ref 8–27)
Bilirubin Total: 0.3 mg/dL (ref 0.0–1.2)
CHLORIDE: 99 mmol/L (ref 96–106)
CO2: 26 mmol/L (ref 20–29)
Calcium: 10 mg/dL (ref 8.7–10.3)
Creatinine, Ser: 0.61 mg/dL (ref 0.57–1.00)
GFR calc Af Amer: 113 mL/min/{1.73_m2} (ref >59)
GFR calc non Af Amer: 98 mL/min/{1.73_m2} (ref >59)
Globulin, Total: 2.6 g/dL (ref 1.5–4.5)
Glucose: 123 mg/dL — ABNORMAL HIGH (ref 65–99)
Potassium: 4.1 mmol/L (ref 3.5–5.2)
Sodium: 141 mmol/L (ref 134–144)
Total Protein: 7 g/dL (ref 6.0–8.5)

## 2018-04-24 LAB — TSH: TSH: 1.63 u[IU]/mL (ref 0.450–4.500)

## 2018-04-24 LAB — MICROALBUMIN, URINE: Microalbumin, Urine: 6.5 ug/mL

## 2018-04-24 LAB — HEMOGLOBIN A1C
Est. average glucose Bld gHb Est-mCnc: 166 mg/dL
Hgb A1c MFr Bld: 7.4 % — ABNORMAL HIGH (ref 4.8–5.6)

## 2018-05-13 ENCOUNTER — Other Ambulatory Visit: Payer: Self-pay | Admitting: Family Medicine

## 2018-05-13 DIAGNOSIS — R05 Cough: Secondary | ICD-10-CM

## 2018-05-13 DIAGNOSIS — R062 Wheezing: Secondary | ICD-10-CM

## 2018-05-13 DIAGNOSIS — R059 Cough, unspecified: Secondary | ICD-10-CM

## 2018-07-03 ENCOUNTER — Other Ambulatory Visit: Payer: Self-pay | Admitting: Family Medicine

## 2018-07-08 ENCOUNTER — Other Ambulatory Visit: Payer: Self-pay | Admitting: Emergency Medicine

## 2018-09-13 ENCOUNTER — Ambulatory Visit (HOSPITAL_COMMUNITY)
Admission: EM | Admit: 2018-09-13 | Discharge: 2018-09-13 | Disposition: A | Discharge status: Home / Self Care | Payer: BLUE CROSS/BLUE SHIELD | Attending: Family Medicine | Admitting: Family Medicine

## 2018-09-13 ENCOUNTER — Other Ambulatory Visit: Payer: Self-pay

## 2018-09-13 ENCOUNTER — Encounter (HOSPITAL_COMMUNITY): Payer: Self-pay

## 2018-09-13 DIAGNOSIS — L237 Allergic contact dermatitis due to plants, except food: Secondary | ICD-10-CM

## 2018-09-13 MED ORDER — PREDNISONE 10 MG (21) PO TBPK: ORAL_TABLET | ORAL | 0 refills | Status: DC

## 2018-09-13 NOTE — ED Provider Notes (Signed)
[place]    CSN: [id number] Arrival date & time: 09/13/18  1033      History   Chief Complaint Chief Complaint  Patient presents with  . Rash    HPI Donna [name] is a 63 y.o. female presenting for 4-day course of pruritic rash on her extremities, right side of face from known exposure to poison ivy.  Patient states she has had this before, last time affected her eye with a lot of periorbital edema: Has not yet gone to this point.  Patient's been using calamine without significant relief.  States she is tolerated prednisone taper well in the past for similar concern.  Patient is a diabetic, last A1c done in March 2020: 7.4 percent.  Patient states she was able to wash all her clothes, detergent, though feels that the facial outbreak is the second to mask use.  Denies fever, chills, chest pain, shortness of breath.   Past Medical History:  Diagnosis Date  . Allergy   . Arthritis   . Diabetes mellitus without complication ([place])   . Difficulty sleeping   . Diverticulitis   . Endometrial cancer ([place]) 10/2013   grade 1 endometrioid adenocarcinoma, s/p total hysterectomy w/ BSO, lymph node biopsy, LOA for treatment by Dr. Denman George  . GERD (gastroesophageal reflux disease)   . History of bronchitis   . History of gout   . Hypertension   . Hypothyroid   . Skin cancer (melanoma) ([place])    to be removed 12/09/13    Patient Active Problem List   Diagnosis Date Noted  . Vitamin B12 deficiency 10/26/2017  . High risk medications (not anticoagulants) long-term use 05/07/2017  . Chronic pain syndrome 05/07/2017  . Chronic bilateral low back pain without sciatica 08/23/2015  . Allergic rhinitis due to pollen 01/10/2015  . Type II diabetes mellitus, uncontrolled ([place]) 03/06/2014  . Multinodular goiter 03/06/2014  . Fatty infiltration of liver 01/26/2014  . Hx of total thyroidectomy with radical neck dissection 11/12/2013  . Diverticulitis large intestine 10/14/2013  .  Other and unspecified ovarian cyst 09/29/2013  . Postmenopause 09/29/2013  . Essential hypertension, benign 09/03/2013  . Postoperative hypothyroidism 09/03/2013  . Vitamin D deficiency 09/03/2013    Past Surgical History:  Procedure Laterality Date  . CHOLECYSTECTOMY    . ROBOTIC ASSISTED TOTAL HYSTERECTOMY WITH BILATERAL SALPINGO OOPHERECTOMY Bilateral 11/23/2013   Procedure: ROBOTIC ASSISTED TOTAL HYSTERECTOMY WITH BILATERAL SALPINGO OOPHORECTOMY, LYMPH NODE DISSECTION ,LYSIS OF ADHESIONS,PELVIC WASHINGS ;  Surgeon: Everitt Amber, MD;  Location: [facility];  Service: Gynecology;  Laterality: Bilateral;  . skin cancer removal     melanoma removed from back   . SUTURE REMOVAL N/A 11/23/2013   Procedure: SUTURE REMOVAL;  Surgeon: Everitt Amber, MD;  Location: [facility];  Service: Gynecology;  Laterality: N/A;  . THYROIDECTOMY      OB History    Gravida  3   Para  1   Term      Preterm      AB  2   Living  1     SAB      TAB      Ectopic      Multiple      Live Births               Home Medications    Prior to Admission medications   Medication Sig Start Date End Date Taking? Authorizing Provider  blood glucose meter kit and supplies KIT Dispense based on patient and  insurance preference. Use up to four times daily as directed. (FOR ICD-9 250.00, 250.01). 03/17/18   Shawnee Knapp, MD  BYDUREON BCISE 2 MG/0.85ML AUIJ INJECT 2 MG INTO THE SKIN ONCE A WEEK. 07/03/18   Delia Chimes A, MD  cyclobenzaprine (FLEXERIL) 10 MG tablet Take 1 tablet (10 mg total) by mouth at bedtime. 10/26/17   Shawnee Knapp, MD  famotidine (PEPCID) 20 MG tablet Take 1 tablet (20 mg total) by mouth 2 (two) times daily as needed for heartburn or indigestion. 02/11/18   Shawnee Knapp, MD  Fluocinolone Acetonide 0.01 % OIL Place 5 drops in ear(s) 2 (two) times daily as needed ("eczema in ears"). 04/21/18   Horald Pollen, MD  glucose blood test strip E11.9. Dispense type to match meter. Use bid to check  sugars. Use as instructed 03/30/18   Shawnee Knapp, MD  ibuprofen (IBU) 800 MG tablet Take 1 tablet (800 mg total) by mouth 3 (three) times daily as needed. 09/07/17   Shawnee Knapp, MD  ipratropium (ATROVENT) 0.06 % nasal spray Place 2 sprays into both nostrils 3 (three) times daily as needed for rhinitis. Reported on 03/23/2015    [provider]  Lancets Misc. MISC E11.9 Dispense type to match meter. 03/30/18   Shawnee Knapp, MD  loratadine (CLARITIN) 10 MG tablet Take 1 tablet (10 mg total) by mouth daily. 02/11/18   Shawnee Knapp, MD  losartan-hydrochlorothiazide (HYZAAR) 100-12.5 MG tablet Take 1 tablet by mouth daily. 10/25/17   Shawnee Knapp, MD  metaxalone (SKELAXIN) 800 MG tablet TAKE 1 TABLET BY MOUTH THREE TIMES A DAY 01/20/18   Shawnee Knapp, MD  metformin (FORTAMET) 1000 MG (OSM) 24 hr tablet TAKE 2 TABLETS (2,000 MG TOTAL) AT BEDTIME 02/11/18   Shawnee Knapp, MD  metoprolol succinate (TOPROL-XL) 100 MG 24 hr tablet Take 1 tablet (100 mg total) by mouth daily. Take with or immediately following a meal. 10/25/17   Shawnee Knapp, MD  montelukast (SINGULAIR) 10 MG tablet Take 1 tablet (10 mg total) by mouth at bedtime. 02/11/18   Shawnee Knapp, MD  MULTIPLE VITAMIN PO Take by mouth daily.    [provider]  pantoprazole (PROTONIX) 40 MG tablet Take 1 tablet (40 mg total) by mouth every evening. 02/11/18   Shawnee Knapp, MD  pravastatin (PRAVACHOL) 20 MG tablet TAKE 1 TABLET BY MOUTH EVERY DAY 07/08/18   Rutherford Guys, MD  predniSONE (STERAPRED UNI-PAK 21 TAB) 10 MG (21) TBPK tablet Take as directed 09/13/18   Hall-Potvin, [place], PA-C  PROAIR HFA 108 (90 Base) MCG/ACT inhaler INHALE 2 PUFFS INTO THE LUNGS EVERY 4 (FOUR) HOURS AS NEEDED FOR WHEEZING OR SHORTNESS OF BREATH. 05/13/18   Shawnee Knapp, MD  sodium chloride (OCEAN) 0.65 % SOLN nasal spray Place 1 spray into both nostrils as needed for congestion. Patient not taking: Reported on 04/21/2018 07/13/16   Shawnee Knapp, MD  traMADol (ULTRAM) 50 MG  tablet Take 1 tablet (50 mg total) by mouth every 8 (eight) hours as needed for severe pain. 04/21/18   Horald Pollen, MD  vitamin B-12 (CYANOCOBALAMIN) 1000 MCG tablet Take 5,000 mcg by mouth daily.    [provider]  Vitamin D, Ergocalciferol, (DRISDOL) 1.25 MG (50000 UT) CAPS capsule TAKE 1 CAPSULE (50,000 UNITS TOTAL) BY MOUTH 3 (THREE) TIMES A WEEK. 01/12/18   Shawnee Knapp, MD    Family History Family History  Problem Relation  Age of Onset  . Stroke Mother   . Heart disease Mother   . Cancer Father 77       pancreatic  . Diabetes Father   . Cancer Brother 70       leukemia  . Diabetes Paternal Uncle     Social History Social History   Tobacco Use  . Smoking status: Former Smoker    Quit date: 11/20/2007    Years since quitting: 10.8  . Smokeless tobacco: Never Used  Substance Use Topics  . Alcohol use: No    Comment: occasional  . Drug use: No     Allergies   Glimepiride, Morphine and related, Colcrys [colchicine], and Lisinopril   Review of Systems Review of Systems  Constitutional: Negative for fatigue and fever.  HENT: Negative for ear pain, sinus pain, sore throat and voice change.   Eyes: Negative for pain, redness and visual disturbance.  Respiratory: Negative for cough and shortness of breath.   Cardiovascular: Negative for chest pain and palpitations.  Gastrointestinal: Negative for abdominal pain, diarrhea and vomiting.  Musculoskeletal: Negative for arthralgias and myalgias.  Skin: Positive for rash. Negative for wound.  Neurological: Negative for syncope and headaches.     Physical Exam Triage Vital Signs ED Triage Vitals [09/13/18 1136]  Enc Vitals Group     BP 123/76     Pulse Rate 81     Resp 16     Temp 97.9 F (36.6 C)     Temp Source Oral     SpO2 95 %     Weight      Height      Head Circumference      Peak Flow      Pain Score 4     Pain Loc      Pain Edu?      Excl. in [place]?    No data found.  Updated  Vital Signs BP 123/76 (BP Location: Left Arm)   Pulse 81   Temp 97.9 F (36.6 C) (Oral)   Resp 16   SpO2 95%   Visual Acuity Right Eye Distance:   Left Eye Distance:   Bilateral Distance:    Right Eye Near:   Left Eye Near:    Bilateral Near:     Physical Exam Constitutional:      General: She is not in acute distress. HENT:     Head: Normocephalic and atraumatic.     Right Ear: Tympanic membrane, ear canal and external ear normal.     Left Ear: Tympanic membrane, ear canal and external ear normal.     Nose: Nose normal.     Mouth/Throat:     Mouth: Mucous membranes are moist.     Pharynx: Oropharynx is clear.  Eyes:     General: No scleral icterus.       Right eye: No discharge.        Left eye: No discharge.     Conjunctiva/sclera: Conjunctivae normal.     Pupils: Pupils are equal, round, and reactive to light.  Neck:     Musculoskeletal: Normal range of motion and neck supple. No muscular tenderness.  Cardiovascular:     Rate and Rhythm: Normal rate.  Pulmonary:     Effort: Pulmonary effort is normal.  Lymphadenopathy:     Cervical: No cervical adenopathy.  Skin:    Coloration: Skin is not jaundiced or pale.     Comments: Scattered, erythematous lesions on distal extremities, left upper arm,  right side of cheek not involving periorbital area with trace vesicles, consistent with contact dermatitis.  Lesions are without warmth, significant tenderness, fluctuance.  No open wounds, bleeding, discharge  Neurological:     Mental Status: She is alert and oriented to person, place, and time.      UC Treatments / Results  Labs (all labs ordered are listed, but only abnormal results are displayed) Labs Reviewed - No data to display  EKG   Radiology No results found.  Procedures Procedures (including critical care time)  Medications Ordered in UC Medications - No data to display  Initial Impression / Assessment and Plan / UC Course  I have reviewed the  triage vital signs and the nursing notes.  Pertinent labs & imaging results that were available during my care of the patient were reviewed by me and considered in my medical decision making (see chart for details).     Very low concern for airway involvement at this time.  No involvement in the periorbital areas bilaterally.  Will treat with Sterapred as listed below.  Discussed this may cause slight increase in blood glucose: Patient has monitor at home, will keep track of her sugar readings while on medication.  Return precautions discussed, patient verbalized understanding and is agreeable to plan. Final Clinical Impressions(s) / UC Diagnoses   Final diagnoses:  Contact dermatitis due to poison ivy     Discharge Instructions     Take steroid pack as prescribed: 6 tabs today, then 5, then 4, 3, 2, 1. Return for worsening pain, swelling, eye involvement, chest pain, difficulty breathing.   ED Prescriptions    Medication Sig Dispense Auth. Provider   predniSONE (STERAPRED UNI-PAK 21 TAB) 10 MG (21) TBPK tablet Take as directed 21 tablet Hall-Potvin, [place], PA-C     Controlled Substance Prescriptions [place] Controlled Substance Registry consulted? Not Applicable   Quincy Sheehan, [place] 09/13/18 1404

## 2018-09-13 NOTE — Discharge Instructions (Signed)
Take steroid pack as prescribed: 6 tabs today, then 5, then 4, 3, 2, 1. Return for worsening pain, swelling, eye involvement, chest pain, difficulty breathing.

## 2018-09-13 NOTE — ED Triage Notes (Signed)
Pt presents with a rash on different areas of her body that she believes to be poison ivy since Thursday afternoon.

## 2018-09-26 ENCOUNTER — Other Ambulatory Visit: Payer: Self-pay | Admitting: Family Medicine

## 2018-09-27 NOTE — Telephone Encounter (Signed)
Requested medication (s) are due for refill today:yes  Requested medication (s) are on the active medication list: yes  Last refill:07/03/2018  Future visit scheduled: No  Notes to clinic:  Needs appointment    Requested Prescriptions  Pending Prescriptions Disp Refills   [place] 2 MG/0.85ML [place] [Pharmacy Med Name: BYDUREON BCISE 2 MG AUTOINJECT]  0    Sig: INJECT 2 MG INTO THE SKIN ONCE A WEEK.     Endocrinology:  Diabetes - GLP-1 Receptor Agonists - exenatide Failed - 09/26/2018  8:34 AM      Failed - Valid encounter within last 6 months    Recent Outpatient Visits          5 months ago Uncontrolled type 2 diabetes mellitus with hyperglycemia [facility])   [facility], Lilia Argue, MD   9 months ago Vitamin B12 deficiency   [facility], Laurey Arrow, MD   11 months ago Need for immunization against influenza   [facility], Lilia Argue, MD   1 year ago Uncontrolled type 2 diabetes mellitus with hyperglycemia [facility])   [facility], Laurey Arrow, MD   1 year ago Uncontrolled type 2 diabetes mellitus with hyperglycemia [facility])   [facility], Laurey Arrow, MD             Passed - HBA1C is between 0 and 7.9 and within 180 days    Hgb A1c MFr Bld  Date Value Ref Range Status  04/23/2018 7.4 (H) 4.8 - 5.6 % Final    Comment:             Prediabetes: 5.7 - 6.4          Diabetes: >6.4          Glycemic control for adults with diabetes: <7.0          Passed - Cr in normal range and within 360 days    Creat  Date Value Ref Range Status  08/24/2015 0.65 0.50 - 1.05 mg/dL Final    Comment:      For patients > or = 63 years of age: The upper reference limit for Creatinine is approximately 13% higher for people identified as African-American.      Creatinine, Ser  Date Value Ref Range Status  04/23/2018 0.61 0.57 - 1.00 mg/dL Final         Passed - eGFR in normal range and within 360 days    GFR, Est African  American  Date Value Ref Range Status  01/07/2014 >89 mL/min Final   GFR calc Af Amer  Date Value Ref Range Status  04/23/2018 113 >59 mL/min/1.73 Final   GFR, Est Non African American  Date Value Ref Range Status  01/07/2014 >89 mL/min Final    Comment:      The estimated GFR is a calculation valid for adults (>=71 years old) that uses the CKD-EPI algorithm to adjust for age and sex. It is   not to be used for children, pregnant women, hospitalized patients,    patients on dialysis, or with rapidly changing kidney function. According to the NKDEP, eGFR >89 is normal, 60-89 shows mild impairment, 30-59 shows moderate impairment, 15-29 shows severe impairment and <15 is ESRD.      GFR calc non Af Amer  Date Value Ref Range Status  04/23/2018 98 >59 mL/min/1.73 Final   GFR  Date Value Ref Range Status  03/01/2014 107.28 >60.00  mL/min Final

## 2018-11-22 LAB — HM DIABETES EYE EXAM

## 2019-03-16 ENCOUNTER — Telehealth: Payer: Self-pay | Admitting: *Deleted

## 2019-03-16 NOTE — Telephone Encounter (Signed)
Please call patient she is due for a follow up for Diabetes.

## 2019-03-16 NOTE — Telephone Encounter (Signed)
Left message for pt. To call for appt.

## 2019-04-22 ENCOUNTER — Ambulatory Visit: Payer: Self-pay | Attending: Internal Medicine

## 2019-04-22 DIAGNOSIS — Z23 Encounter for immunization: Secondary | ICD-10-CM

## 2019-04-22 NOTE — Progress Notes (Signed)
   Covid-19 Vaccination Clinic  Name:  Donna [name]    MRN: [id number] DOB: 27-Apr-1955  04/22/2019  Donna [name] was observed post Covid-19 immunization for 15 minutes without incident. She was provided with Vaccine Information Sheet and instruction to access the V-Safe system.   Donna [name] was instructed to call 911 with any severe reactions post vaccine: Marland Kitchen Difficulty breathing  . Swelling of face and throat  . A fast heartbeat  . A bad rash all over body  . Dizziness and weakness   Immunizations Administered    Name Date Dose VIS Date Route   Pfizer COVID-19 Vaccine 04/22/2019  2:05 PM 0.3 mL 01/08/2019 Intramuscular   Manufacturer: [place]   Lot: [id number]   [place]: [id number]

## 2019-05-17 ENCOUNTER — Ambulatory Visit: Payer: Self-pay | Attending: Internal Medicine

## 2019-05-17 DIAGNOSIS — Z23 Encounter for immunization: Secondary | ICD-10-CM

## 2019-05-17 NOTE — Progress Notes (Signed)
   Covid-19 Vaccination Clinic  Name:  Donna [name]    MRN: [id number] DOB: 11-Jul-1955  05/17/2019  [name] was observed post Covid-19 immunization for 15 minutes without incident. She was provided with Vaccine Information Sheet and instruction to access the V-Safe system.   [name] was instructed to call 911 with any severe reactions post vaccine: Marland Kitchen Difficulty breathing  . Swelling of face and throat  . A fast heartbeat  . A bad rash all over body  . Dizziness and weakness   Immunizations Administered    Name Date Dose VIS Date Route   Pfizer COVID-19 Vaccine 05/17/2019  2:04 PM 0.3 mL 03/24/2018 Intramuscular   Manufacturer: [place]   Lot: [id number]   [place]: [id number]

## 2019-07-14 ENCOUNTER — Other Ambulatory Visit: Payer: Self-pay | Admitting: Family Medicine

## 2019-07-14 DIAGNOSIS — Z1231 Encounter for screening mammogram for malignant neoplasm of breast: Secondary | ICD-10-CM

## 2019-08-05 ENCOUNTER — Ambulatory Visit
Admission: RE | Admit: 2019-08-05 | Discharge: 2019-08-05 | Disposition: A | Discharge status: Home / Self Care | Payer: 59 | Source: Ambulatory Visit | Attending: Family Medicine | Admitting: Family Medicine

## 2019-08-05 ENCOUNTER — Other Ambulatory Visit: Payer: Self-pay

## 2019-08-05 DIAGNOSIS — Z1231 Encounter for screening mammogram for malignant neoplasm of breast: Secondary | ICD-10-CM

## 2019-08-31 ENCOUNTER — Other Ambulatory Visit: Payer: Self-pay | Admitting: Physician Assistant

## 2019-08-31 DIAGNOSIS — R160 Hepatomegaly, not elsewhere classified: Secondary | ICD-10-CM

## 2019-08-31 DIAGNOSIS — K760 Fatty (change of) liver, not elsewhere classified: Secondary | ICD-10-CM

## 2019-09-07 ENCOUNTER — Ambulatory Visit
Admission: RE | Admit: 2019-09-07 | Discharge: 2019-09-07 | Disposition: A | Discharge status: Home / Self Care | Payer: 59 | Source: Ambulatory Visit | Attending: Physician Assistant | Admitting: Physician Assistant

## 2019-09-07 DIAGNOSIS — K760 Fatty (change of) liver, not elsewhere classified: Secondary | ICD-10-CM

## 2019-09-07 DIAGNOSIS — R160 Hepatomegaly, not elsewhere classified: Secondary | ICD-10-CM

## 2019-11-12 ENCOUNTER — Other Ambulatory Visit: Payer: Self-pay

## 2019-11-12 ENCOUNTER — Emergency Department (HOSPITAL_BASED_OUTPATIENT_CLINIC_OR_DEPARTMENT_OTHER): Payer: 59

## 2019-11-12 ENCOUNTER — Emergency Department (HOSPITAL_BASED_OUTPATIENT_CLINIC_OR_DEPARTMENT_OTHER)
Admission: EM | Admit: 2019-11-12 | Discharge: 2019-11-12 | Disposition: A | Discharge status: Home / Self Care | Payer: 59 | Attending: Emergency Medicine | Admitting: Emergency Medicine

## 2019-11-12 ENCOUNTER — Encounter (HOSPITAL_BASED_OUTPATIENT_CLINIC_OR_DEPARTMENT_OTHER): Payer: Self-pay | Admitting: *Deleted

## 2019-11-12 DIAGNOSIS — Z87891 Personal history of nicotine dependence: Secondary | ICD-10-CM | POA: Diagnosis not present

## 2019-11-12 DIAGNOSIS — E119 Type 2 diabetes mellitus without complications: Secondary | ICD-10-CM | POA: Insufficient documentation

## 2019-11-12 DIAGNOSIS — Z8542 Personal history of malignant neoplasm of other parts of uterus: Secondary | ICD-10-CM | POA: Insufficient documentation

## 2019-11-12 DIAGNOSIS — I10 Essential (primary) hypertension: Secondary | ICD-10-CM | POA: Insufficient documentation

## 2019-11-12 DIAGNOSIS — Z7984 Long term (current) use of oral hypoglycemic drugs: Secondary | ICD-10-CM | POA: Diagnosis not present

## 2019-11-12 DIAGNOSIS — R319 Hematuria, unspecified: Secondary | ICD-10-CM | POA: Diagnosis present

## 2019-11-12 DIAGNOSIS — N201 Calculus of ureter: Secondary | ICD-10-CM | POA: Insufficient documentation

## 2019-11-12 DIAGNOSIS — E039 Hypothyroidism, unspecified: Secondary | ICD-10-CM | POA: Insufficient documentation

## 2019-11-12 DIAGNOSIS — K5792 Diverticulitis of intestine, part unspecified, without perforation or abscess without bleeding: Secondary | ICD-10-CM | POA: Diagnosis not present

## 2019-11-12 DIAGNOSIS — Z79899 Other long term (current) drug therapy: Secondary | ICD-10-CM | POA: Insufficient documentation

## 2019-11-12 LAB — CBC WITH DIFFERENTIAL/PLATELET
Abs Immature Granulocytes: 0.07 10*3/uL (ref 0.00–0.07)
Basophils Absolute: 0.1 10*3/uL (ref 0.0–0.1)
Basophils Relative: 1 %
Eosinophils Absolute: 0.2 10*3/uL (ref 0.0–0.5)
Eosinophils Relative: 1 %
HCT: 41.4 % (ref 36.0–46.0)
Hemoglobin: 13.7 g/dL (ref 12.0–15.0)
Immature Granulocytes: 1 %
Lymphocytes Relative: 36 %
Lymphs Abs: 5 10*3/uL — ABNORMAL HIGH (ref 0.7–4.0)
MCH: 28.5 pg (ref 26.0–34.0)
MCHC: 33.1 g/dL (ref 30.0–36.0)
MCV: 86.3 fL (ref 80.0–100.0)
Monocytes Absolute: 0.9 10*3/uL (ref 0.1–1.0)
Monocytes Relative: 6 %
Neutro Abs: 7.6 10*3/uL (ref 1.7–7.7)
Neutrophils Relative %: 55 %
Platelets: 376 10*3/uL (ref 150–400)
RBC: 4.8 MIL/uL (ref 3.87–5.11)
RDW: 12.3 % (ref 11.5–15.5)
WBC: 13.8 10*3/uL — ABNORMAL HIGH (ref 4.0–10.5)
nRBC: 0 % (ref 0.0–0.2)

## 2019-11-12 LAB — COMPREHENSIVE METABOLIC PANEL
ALT: 30 U/L (ref 0–44)
AST: 30 U/L (ref 15–41)
Albumin: 3.9 g/dL (ref 3.5–5.0)
Alkaline Phosphatase: 58 U/L (ref 38–126)
Anion gap: 12 (ref 5–15)
BUN: 17 mg/dL (ref 8–23)
CO2: 28 mmol/L (ref 22–32)
Calcium: 9 mg/dL (ref 8.9–10.3)
Chloride: 100 mmol/L (ref 98–111)
Creatinine, Ser: 0.78 mg/dL (ref 0.44–1.00)
GFR, Estimated: >60 mL/min (ref >60)
Glucose, Bld: 120 mg/dL — ABNORMAL HIGH (ref 70–99)
Potassium: 3.6 mmol/L (ref 3.5–5.1)
Sodium: 140 mmol/L (ref 135–145)
Total Bilirubin: 0.7 mg/dL (ref 0.3–1.2)
Total Protein: 7.3 g/dL (ref 6.5–8.1)

## 2019-11-12 LAB — URINALYSIS, ROUTINE W REFLEX MICROSCOPIC
Bilirubin Urine: NEGATIVE
Glucose, UA: NEGATIVE mg/dL
Ketones, ur: NEGATIVE mg/dL
Nitrite: NEGATIVE
Protein, ur: NEGATIVE mg/dL
Specific Gravity, Urine: 1.02 (ref 1.005–1.030)
pH: 6 (ref 5.0–8.0)

## 2019-11-12 LAB — URINALYSIS, MICROSCOPIC (REFLEX)

## 2019-11-12 LAB — LIPASE, BLOOD: Lipase: 25 U/L (ref 11–51)

## 2019-11-12 MED ORDER — METRONIDAZOLE 500 MG PO TABS
500.0000 mg | ORAL_TABLET | Freq: Three times a day (TID) | ORAL | 0 refills | Status: AC
Start: 2019-11-12 — End: 2019-11-19

## 2019-11-12 MED ORDER — CIPROFLOXACIN HCL 500 MG PO TABS
500.0000 mg | ORAL_TABLET | Freq: Two times a day (BID) | ORAL | 0 refills | Status: DC
Start: 2019-11-12 — End: 2023-05-10

## 2019-11-12 MED ORDER — IOHEXOL 300 MG/ML  SOLN
100.0000 mL | Freq: Once | INTRAMUSCULAR | Status: AC | PRN
  Administered 2019-11-12: 100 mL via INTRAVENOUS

## 2019-11-12 MED ORDER — TAMSULOSIN HCL 0.4 MG PO CAPS: 0.4000 mg | ORAL_CAPSULE | Freq: Every day | ORAL | 0 refills | Status: AC

## 2019-11-12 MED ORDER — HYDROCODONE-ACETAMINOPHEN 5-325 MG PO TABS: 1.0000 | ORAL_TABLET | Freq: Four times a day (QID) | ORAL | 0 refills | Status: AC | PRN

## 2019-11-12 MED ORDER — KETOROLAC TROMETHAMINE 30 MG/ML IJ SOLN
15.0000 mg | Freq: Once | INTRAMUSCULAR | Status: AC
  Administered 2019-11-12: 15 mg via INTRAVENOUS
  Filled 2019-11-12: qty 1

## 2019-11-12 NOTE — ED Triage Notes (Signed)
Left mid abdominal pain x 2 weeks.

## 2019-11-12 NOTE — ED Provider Notes (Signed)
[facility] DEPARTMENT Provider Note   CSN: [id number] Arrival date & time: 11/12/19  1120     History Chief Complaint  Patient presents with  . Abdominal Pain    Donna [name] is a 64 y.o. female with PMHx diabetes, diverticulitis, HTN, GERD who presents to the ED today with complaint of hematuria x 2 weeks. Pt reports that she saw her PCP 2 weeks ago for same and was diagnosed with a UTI. She was treated with Bactrim with mild relief of her symptoms however states that she then began having left flank/left mid abdominal pain earlier this week. She went back to her PCP and had a repeat U/A done which showed continuation of UTI and she was prescribed Cipro. She states she took 1 dose of the Cipro and then began vomiting. She called back and was prescribed zofran however told to stop the cipro and wait for the culture report - she has not heard back regarding culture report however reports continuation of pain today prompting her to come to the ED. Pt states that the pain fluctuates in intensity and when it is very severe she will break out in a sweat - she does not believe she has had a fever at home however. Pt states this feels different to previous diverticulitis. She has had a kidney stone when she was 17 however cannot remember what it felt like. She does mention her brother suffers from kidney stones quite frequently. Pt has no other complaints at this time.   The history is provided by the patient and medical records.       Past Medical History:  Diagnosis Date  . Allergy   . Arthritis   . Diabetes mellitus without complication ([place])   . Difficulty sleeping   . Diverticulitis   . Endometrial cancer (Start) 10/2013   grade 1 endometrioid adenocarcinoma, s/p total hysterectomy w/ BSO, lymph node biopsy, LOA for treatment by Dr. Denman George  . GERD (gastroesophageal reflux disease)   . History of bronchitis   . History of gout   . Hypertension   . Hypothyroid     . Skin cancer (melanoma) ([place])    to be removed 12/09/13    Patient Active Problem List   Diagnosis Date Noted  . Vitamin B12 deficiency 10/26/2017  . High risk medications (not anticoagulants) long-term use 05/07/2017  . Chronic pain syndrome 05/07/2017  . Chronic bilateral low back pain without sciatica 08/23/2015  . Allergic rhinitis due to pollen 01/10/2015  . Type II diabetes mellitus, uncontrolled ([place]) 03/06/2014  . Multinodular goiter 03/06/2014  . Fatty infiltration of liver 01/26/2014  . Hx of total thyroidectomy with radical neck dissection 11/12/2013  . Diverticulitis large intestine 10/14/2013  . Other and unspecified ovarian cyst 09/29/2013  . Postmenopause 09/29/2013  . Essential hypertension, benign 09/03/2013  . Postoperative hypothyroidism 09/03/2013  . Vitamin D deficiency 09/03/2013    Past Surgical History:  Procedure Laterality Date  . BREAST BIOPSY Right   . CHOLECYSTECTOMY    . ROBOTIC ASSISTED TOTAL HYSTERECTOMY WITH BILATERAL SALPINGO OOPHERECTOMY Bilateral 11/23/2013   Procedure: ROBOTIC ASSISTED TOTAL HYSTERECTOMY WITH BILATERAL SALPINGO OOPHORECTOMY, LYMPH NODE DISSECTION ,LYSIS OF ADHESIONS,PELVIC WASHINGS ;  Surgeon: Everitt Amber, MD;  Location: [facility];  Service: Gynecology;  Laterality: Bilateral;  . skin cancer removal     melanoma removed from back   . SUTURE REMOVAL N/A 11/23/2013   Procedure: SUTURE REMOVAL;  Surgeon: Everitt Amber, MD;  Location: [facility];  Service:  Gynecology;  Laterality: N/A;  . THYROIDECTOMY       OB History    Gravida  3   Para  1   Term      Preterm      AB  2   Living  1     SAB      TAB      Ectopic      Multiple      Live Births              Family History  Problem Relation Age of Onset  . Stroke Mother   . Heart disease Mother   . Cancer Father 107       pancreatic  . Diabetes Father   . Cancer Brother 51       leukemia  . Diabetes Paternal Uncle     Social History   Tobacco Use   . Smoking status: Former Smoker    Quit date: 11/20/2007    Years since quitting: 11.9  . Smokeless tobacco: Never Used  Vaping Use  . Vaping Use: Never used  Substance Use Topics  . Alcohol use: No    Comment: occasional  . Drug use: No    Home Medications Prior to Admission medications   Medication Sig Start Date End Date Taking? Authorizing Provider  famotidine (PEPCID) 40 MG tablet Take 40 mg by mouth 2 (two) times daily as needed. 08/03/19  Yes [provider]  levocetirizine (XYZAL) 5 MG tablet Take 5 mg by mouth every morning. 07/18/19  Yes [provider]  metformin (FORTAMET) 1000 MG (OSM) 24 hr tablet TAKE 2 TABLETS (2,000 MG TOTAL) AT BEDTIME 02/11/18  Yes Shawnee Knapp, MD  MULTIPLE VITAMIN PO Take by mouth daily.   Yes [provider]  OZEMPIC, 0.25 OR 0.5 MG/DOSE, 2 MG/1.5ML SOPN Inject into the skin. 05/31/19  Yes [provider]  pantoprazole (PROTONIX) 40 MG tablet Take 1 tablet (40 mg total) by mouth every evening. 02/11/18  Yes Shawnee Knapp, MD  pravastatin (PRAVACHOL) 20 MG tablet TAKE 1 TABLET BY MOUTH EVERY DAY 07/08/18  Yes Rutherford Guys, MD  traMADol (ULTRAM) 50 MG tablet Take 1 tablet (50 mg total) by mouth every 8 (eight) hours as needed for severe pain. 04/21/18  Yes Sagardia, Ines Bloomer, MD  vitamin B-12 (CYANOCOBALAMIN) 1000 MCG tablet Take 5,000 mcg by mouth daily.   Yes [provider]  Vitamin D, Ergocalciferol, (DRISDOL) 1.25 MG (50000 UT) CAPS capsule TAKE 1 CAPSULE (50,000 UNITS TOTAL) BY MOUTH 3 (THREE) TIMES A WEEK. 01/12/18  Yes Shawnee Knapp, MD  blood glucose meter kit and supplies KIT Dispense based on patient and insurance preference. Use up to four times daily as directed. (FOR ICD-9 250.00, 250.01). 03/17/18   Shawnee Knapp, MD  BYDUREON BCISE 2 MG/0.85ML AUIJ INJECT 2 MG INTO THE SKIN ONCE A WEEK. 07/03/18   Forrest Moron, MD  ciprofloxacin (CIPRO) 500 MG tablet Take 1 tablet (500 mg total) by mouth 2 (two)  times daily. 11/12/19   Alroy Bailiff, Eileene Kisling, PA-C  cyclobenzaprine (FLEXERIL) 10 MG tablet Take 1 tablet (10 mg total) by mouth at bedtime. 10/26/17   Shawnee Knapp, MD  famotidine (PEPCID) 20 MG tablet Take 1 tablet (20 mg total) by mouth 2 (two) times daily as needed for heartburn or indigestion. 02/11/18   Shawnee Knapp, MD  Fluocinolone Acetonide 0.01 % OIL Place 5 drops in ear(s) 2 (two) times daily  as needed ("eczema in ears"). 04/21/18   Horald Pollen, MD  glucose blood test strip E11.9. Dispense type to match meter. Use bid to check sugars. Use as instructed 03/30/18   Shawnee Knapp, MD  HYDROcodone-acetaminophen (NORCO/VICODIN) 5-325 MG tablet Take 1 tablet by mouth every 6 (six) hours as needed for severe pain. 11/12/19   Alroy Bailiff, Lynise Porr, PA-C  ibuprofen (IBU) 800 MG tablet Take 1 tablet (800 mg total) by mouth 3 (three) times daily as needed. 09/07/17   Shawnee Knapp, MD  ipratropium (ATROVENT) 0.06 % nasal spray Place 2 sprays into both nostrils 3 (three) times daily as needed for rhinitis. Reported on 03/23/2015    [provider]  Lancets Misc. MISC E11.9 Dispense type to match meter. 03/30/18   Shawnee Knapp, MD  loratadine (CLARITIN) 10 MG tablet Take 1 tablet (10 mg total) by mouth daily. 02/11/18   Shawnee Knapp, MD  losartan-hydrochlorothiazide (HYZAAR) 100-12.5 MG tablet Take 1 tablet by mouth daily. 10/25/17   Shawnee Knapp, MD  metaxalone (SKELAXIN) 800 MG tablet TAKE 1 TABLET BY MOUTH THREE TIMES A DAY 01/20/18   Shawnee Knapp, MD  metFORMIN (GLUCOPHAGE-XR) 500 MG 24 hr tablet Take 2,000 mg by mouth at bedtime. 07/18/19   [provider]  metoprolol succinate (TOPROL-XL) 100 MG 24 hr tablet Take 1 tablet (100 mg total) by mouth daily. Take with or immediately following a meal. 10/25/17   Shawnee Knapp, MD  metroNIDAZOLE (FLAGYL) 500 MG tablet Take 1 tablet (500 mg total) by mouth 3 (three) times daily for 7 days. 11/12/19 11/19/19  Vong Garringer, PA-C  montelukast (SINGULAIR) 10 MG  tablet Take 1 tablet (10 mg total) by mouth at bedtime. 02/11/18   Shawnee Knapp, MD  predniSONE (STERAPRED UNI-PAK 21 TAB) 10 MG (21) TBPK tablet Take as directed 09/13/18   Hall-Potvin, [place], PA-C  PROAIR HFA 108 (90 Base) MCG/ACT inhaler INHALE 2 PUFFS INTO THE LUNGS EVERY 4 (FOUR) HOURS AS NEEDED FOR WHEEZING OR SHORTNESS OF BREATH. 05/13/18   Shawnee Knapp, MD  sodium chloride (OCEAN) 0.65 % SOLN nasal spray Place 1 spray into both nostrils as needed for congestion. Patient not taking: Reported on 04/21/2018 07/13/16   Shawnee Knapp, MD  tamsulosin (FLOMAX) 0.4 MG CAPS capsule Take 1 capsule (0.4 mg total) by mouth daily for 7 days. 11/12/19 11/19/19  Eustaquio Maize, PA-C    Allergies    Glimepiride, Morphine and related, Colcrys [colchicine], and Lisinopril  Review of Systems   Review of Systems  Constitutional: Negative for chills and fever.  Gastrointestinal: Positive for abdominal pain, diarrhea (chronic diarrhea), nausea and vomiting (after Cipro). Negative for constipation.  Genitourinary: Positive for flank pain and hematuria. Negative for difficulty urinating, dysuria and frequency.  All other systems reviewed and are negative.   Physical Exam Updated Vital Signs BP (!) 157/80   Pulse 91   Temp 98.3 F (36.8 C) (Oral)   Resp 18   Ht 5' 4"  (1.626 m)   Wt 98.7 kg   SpO2 98%   BMI 37.35 kg/m   Physical Exam Vitals and nursing note reviewed.  Constitutional:      Appearance: She is not ill-appearing or diaphoretic.  HENT:     Head: Normocephalic and atraumatic.  Eyes:     Conjunctiva/sclera: Conjunctivae normal.  Cardiovascular:     Rate and Rhythm: Normal rate and regular rhythm.     Heart sounds: Normal heart sounds.  Pulmonary:  Effort: Pulmonary effort is normal.     Breath sounds: Normal breath sounds. No wheezing, rhonchi or rales.  Abdominal:     Palpations: Abdomen is soft.     Tenderness: There is abdominal tenderness. There is left CVA tenderness. There  is no guarding or rebound.     Comments: Soft, + left sided mid abdominal TTP as well as left CVA TTP, +BS throughout, no r/g/r, neg murphy's, neg mcburney's  Musculoskeletal:     Cervical back: Neck supple.  Skin:    General: Skin is warm and dry.  Neurological:     Mental Status: She is alert.     ED Results / Procedures / Treatments   Labs (all labs ordered are listed, but only abnormal results are displayed) Labs Reviewed  URINALYSIS, ROUTINE W REFLEX MICROSCOPIC - Abnormal; Notable for the following components:      Result Value   Hgb urine dipstick MODERATE (*)    Leukocytes,Ua TRACE (*)    All other components within normal limits  URINALYSIS, MICROSCOPIC (REFLEX) - Abnormal; Notable for the following components:   Bacteria, UA MANY (*)    All other components within normal limits  CBC WITH DIFFERENTIAL/PLATELET - Abnormal; Notable for the following components:   WBC 13.8 (*)    Lymphs Abs 5.0 (*)    All other components within normal limits  COMPREHENSIVE METABOLIC PANEL - Abnormal; Notable for the following components:   Glucose, Bld 120 (*)    All other components within normal limits  URINE CULTURE  LIPASE, BLOOD    EKG None  Radiology CT Abdomen Pelvis W Contrast  Result Date: 11/12/2019 CLINICAL DATA:  64 year old female with left abdominal pain for 1 week. Gross hematuria. EXAM: CT ABDOMEN AND PELVIS WITH CONTRAST TECHNIQUE: Multidetector CT imaging of the abdomen and pelvis was performed using the standard protocol following bolus administration of intravenous contrast. CONTRAST:  192m OMNIPAQUE IOHEXOL 300 MG/ML  SOLN COMPARISON:  CT Abdomen and Pelvis 08/13/2005. FINDINGS: Lower chest: Negative. Hepatobiliary: Evidence of hepatic steatosis. A tiny circumscribed low-density area in the right lobe on series 2, image 19 is too small to characterize but most likely benign such as cyst. Riedel lobe variant versus hepatomegaly (coronal image 55). Chronically  absent gallbladder.  No bile duct enlargement. Pancreas: Partially atrophied but otherwise negative. Spleen: Negative.  No splenomegaly. Adrenals/Urinary Tract: Normal adrenal glands. Normal right kidney and right ureter. Asymmetric left hydronephrosis and proximal hydroureter to the level of a punctate calculus located about 3 cm from the left UPJ (coronal image 60). Associated asymmetrically delayed nephrogram and contrast excretion on that side. Mild left perinephric stranding. Distal to the punctate calculus the ureter is decompressed and remains normal to the bladder. Incidental pelvic phleboliths. Decompressed bladder. No additional urinary calculus identified. Stomach/Bowel: Negative rectum and distal sigmoid aside from retained stool. Diverticulosis throughout the proximal sigmoid colon with associated generalized sigmoid wall thickening (series 2, image 69 and coronal image 46. This appearance continues throughout the descending colon, and there is faint associated mesenteric inflammatory stranding at the descending colon level on series 2, image 49. The transverse colon has a more normal appearance although diverticulosis persists. Negative right colon. Normal appendix on series 2, image 48. Negative terminal ileum. No dilated small bowel. Decompressed stomach and duodenum. No free air. No free fluid. Vascular/Lymphatic: Major arterial structures are patent. Minimal atherosclerosis visible. Portal venous system is patent. No lymphadenopathy. Reproductive: Surgically absent uterus now. Diminutive or absent ovaries. Other: No pelvic free fluid.  Musculoskeletal: Increased chronic degenerative changes in the spine. No acute or suspicious osseous lesion identified. Benign degenerative sclerosis or less likely bone island in the left L2 body has only mildly increased since 2007. IMPRESSION: 1. Acute obstructive uropathy on the left due to a punctate ureteral calculus located about 3 cm from the left UPJ. No  other urinary calculus identified. 2. Superimposed Acute Diverticulitis of the descending and sigmoid colon. No abscess or other complicating features. Widespread diverticulosis in the large bowel. 3. Hepatic steatosis. Electronically Signed   By: Genevie Jamecia M.D.   On: 11/12/2019 18:12    Procedures Procedures (including critical care time)  Medications Ordered in ED Medications  ketorolac (TORADOL) 30 MG/ML injection 15 mg (15 mg Intravenous Given 11/12/19 1743)  iohexol (OMNIPAQUE) 300 MG/ML solution 100 mL (100 mLs Intravenous Contrast Given 11/12/19 1753)    ED Course  I have reviewed the triage vital signs and the nursing notes.  Pertinent labs & imaging results that were available during my care of the patient were reviewed by me and considered in my medical decision making (see chart for details).    MDM Rules/Calculators/A&P                          64 year old female presents to the ED today complaining of hematuria and abdominal pain for the past 1 to 2 weeks.  Has been treated for UTI with Bactrim 2 weeks ago, repeat UA earlier this week did show continuation of hematuria and she was changed to Cipro however began having vomiting after taking 1 dose and was told to discontinue and wait for culture report which she has not heard from.  Patient presenting to the ED today with continuation of pain.  On arrival to the ED patient is afebrile, nontachycardic nontachypneic.  She appears to be in no acute distress.  Analysis obtained while in the waiting room with moderate hemoglobin, trace leuks, 11-20 red blood cells, many bacteria.  CBC is noted to have a leukocytosis of 13.8.  CMP with glucose 120, no other findings.  LFTs all within normal limits.  Lipase 25.  On exam patient has left-sided middle abdominal tenderness palpation right in between the left lower quadrant and left upper quadrant.  She is also noted to have left CVA tenderness palpation.  I am suspicious for kidney stone versus  Pilo versus diverticulitis.  Patient does have history of diverticulitis in the past however states this feels different.  Given her age and symptoms we will plan for CT scan at this time for further assessment.  Given leukocytosis we will plan to do CT scan with IV contrast to assess for infectious etiology as well as kidney stone as this will not obscure stone. Pain meds provided.   CT scan: IMPRESSION:  1. Acute obstructive uropathy on the left due to a punctate ureteral  calculus located about 3 cm from the left UPJ. No other urinary  calculus identified.    2. Superimposed Acute Diverticulitis of the descending and sigmoid  colon. No abscess or other complicating features. Widespread  diverticulosis in the large bowel.    3. Hepatic steatosis.    UA does not appaer infectious today. Suspect leukocytosis related to diverticulitis. Pt does not appear septic and I doubt infected stone at this time. Have discussed this with attending physician Dr. Billy Fischer who recommends outpatient urology follow up; does not believe urology needs to be consulted at this time.  Will plan to discharge pt home with cipro and flagyl for diverticulitis as well as short course of pain meds (pt regularly on tramadol however states it did not help with pain earlier today). She is instructed to not take these together or close together in time. She is also prescribed flomax given uropathy. Instructed to increase water intake to help flush out stone. Pt has zofran at home PRN. She will follow up with [facility] for stone and her PCP for diverticulitis. Strict return precautions discussed. Pt is in agreement with plan and stable for discharge home.   This note was prepared using Dragon voice recognition software and may include unintentional dictation errors due to the inherent limitations of voice recognition software.  Final Clinical Impression(s) / ED Diagnoses Final diagnoses:  Ureteral stone   Hematuria, unspecified type  Diverticulitis    Rx / DC Orders ED Discharge Orders         Ordered    HYDROcodone-acetaminophen (NORCO/VICODIN) 5-325 MG tablet  Every 6 hours PRN        11/12/19 1856    ciprofloxacin (CIPRO) 500 MG tablet  2 times daily        11/12/19 1857    metroNIDAZOLE (FLAGYL) 500 MG tablet  3 times daily        11/12/19 1857    tamsulosin (FLOMAX) 0.4 MG CAPS capsule  Daily        11/12/19 1857           Discharge Instructions     You were found to have both a 3 mm ureteral stone and diverticulitis today. Please take prescriptions to pharmacy of your choosing to pick up. Take as prescribed.   I have prescribed 2 antibiotics to cover for your diverticulitis. I do not believe your 1 episode of vomiting was related to the Cipro given it was several hours later however I would recommend taking the Zofran 30 minutes prior and then taking the Cipro.   I have also prescribed medication to help you urinate and flush out the stone. Drink plenty of fluids to help with this as well.   I have also prescribed a short course of pain medication to take AS NEEDED. I would recommend taking this instead of your regular Tramadol to see if this will help. DO NOT TAKE THESE TOGETHER OR WITHIN 6 HOURS OF EACH OTHER.   Follow up with [facility] regarding your stone. Call to see if they accept your insurance.   Follow up with your PCP regarding your diverticulitis. They will need to recheck you next week.   Return to the ED IMMEDIATELY for any worsening symptoms including worsening pain, inability to urinate, fevers > 100.4, excessive vomiting, blood in stool, or any other new/worsening symptoms.        Eustaquio Maize, PA-C 11/12/19 1903    Gareth Morgan, MD 11/13/19 2148

## 2019-11-12 NOTE — Discharge Instructions (Addendum)
You were found to have both a 3 mm ureteral stone and diverticulitis today. Please take prescriptions to pharmacy of your choosing to pick up. Take as prescribed.   I have prescribed 2 antibiotics to cover for your diverticulitis. I do not believe your 1 episode of vomiting was related to the Cipro given it was several hours later however I would recommend taking the Zofran 30 minutes prior and then taking the Cipro.   I have also prescribed medication to help you urinate and flush out the stone. Drink plenty of fluids to help with this as well.   I have also prescribed a short course of pain medication to take AS NEEDED. I would recommend taking this instead of your regular Tramadol to see if this will help. DO NOT TAKE THESE TOGETHER OR WITHIN 6 HOURS OF EACH OTHER.   Follow up with [facility] regarding your stone. Call to see if they accept your insurance.   Follow up with your PCP regarding your diverticulitis. They will need to recheck you next week.   Return to the ED IMMEDIATELY for any worsening symptoms including worsening pain, inability to urinate, fevers > 100.4, excessive vomiting, blood in stool, or any other new/worsening symptoms.

## 2019-11-12 NOTE — ED Notes (Signed)
ED Provider at bedside. 

## 2019-11-13 LAB — URINE CULTURE: Culture: <10000 — AB

## 2020-06-10 ENCOUNTER — Other Ambulatory Visit: Payer: Self-pay

## 2020-06-10 ENCOUNTER — Encounter (HOSPITAL_COMMUNITY): Payer: Self-pay | Admitting: Emergency Medicine

## 2020-06-10 ENCOUNTER — Ambulatory Visit (HOSPITAL_COMMUNITY)
Admission: EM | Admit: 2020-06-10 | Discharge: 2020-06-10 | Disposition: A | Discharge status: Home / Self Care | Payer: 59 | Attending: Physician Assistant | Admitting: Physician Assistant

## 2020-06-10 DIAGNOSIS — L247 Irritant contact dermatitis due to plants, except food: Secondary | ICD-10-CM | POA: Diagnosis not present

## 2020-06-10 MED ORDER — PREDNISONE 10 MG (21) PO TBPK
ORAL_TABLET | ORAL | 0 refills | Status: DC
Start: 2020-06-10 — End: 2022-11-05

## 2020-06-10 NOTE — ED Triage Notes (Signed)
Pt presents with poison Ivy xs 4 days. States has spread all over.

## 2020-06-10 NOTE — ED Provider Notes (Signed)
Donna [name]    CSN: [id number] Arrival date & time: 06/10/20  1155      History   Chief Complaint Chief Complaint  Patient presents with  . Poison Ivy    HPI Donna [name] is a 65 y.o. female.   Patient here c/w "poison ivy" on neck, started day after she removed poison ivy from her fence, 4 days ago.  She admits itchiness, rash, erythema.  Denies pain, tenderness, discharge.  She has had poison ivy frequently.  PMH DM.     Past Medical History:  Diagnosis Date  . Allergy   . Arthritis   . Diabetes mellitus without complication ([place])   . Difficulty sleeping   . Diverticulitis   . Endometrial cancer ([place]) 10/2013   grade 1 endometrioid adenocarcinoma, s/p total hysterectomy w/ BSO, lymph node biopsy, LOA for treatment by Dr. Denman George  . GERD (gastroesophageal reflux disease)   . History of bronchitis   . History of gout   . Hypertension   . Hypothyroid   . Skin cancer (melanoma) ([place])    to be removed 12/09/13    Patient Active Problem List   Diagnosis Date Noted  . Vitamin B12 deficiency 10/26/2017  . High risk medications (not anticoagulants) long-term use 05/07/2017  . Chronic pain syndrome 05/07/2017  . Chronic bilateral low back pain without sciatica 08/23/2015  . Allergic rhinitis due to pollen 01/10/2015  . Type II diabetes mellitus, uncontrolled ([place]) 03/06/2014  . Multinodular goiter 03/06/2014  . Fatty infiltration of liver 01/26/2014  . Hx of total thyroidectomy with radical neck dissection 11/12/2013  . Diverticulitis large intestine 10/14/2013  . Other and unspecified ovarian cyst 09/29/2013  . Postmenopause 09/29/2013  . Essential hypertension, benign 09/03/2013  . Postoperative hypothyroidism 09/03/2013  . Vitamin D deficiency 09/03/2013    Past Surgical History:  Procedure Laterality Date  . BREAST BIOPSY Right   . CHOLECYSTECTOMY    . ROBOTIC ASSISTED TOTAL HYSTERECTOMY WITH BILATERAL SALPINGO OOPHERECTOMY Bilateral  11/23/2013   Procedure: ROBOTIC ASSISTED TOTAL HYSTERECTOMY WITH BILATERAL SALPINGO OOPHORECTOMY, LYMPH NODE DISSECTION ,LYSIS OF ADHESIONS,PELVIC WASHINGS ;  Surgeon: Everitt Amber, MD;  Location: [facility];  Service: Gynecology;  Laterality: Bilateral;  . skin cancer removal     melanoma removed from back   . SUTURE REMOVAL N/A 11/23/2013   Procedure: SUTURE REMOVAL;  Surgeon: Everitt Amber, MD;  Location: [facility];  Service: Gynecology;  Laterality: N/A;  . THYROIDECTOMY      OB History    Gravida  3   Para  1   Term      Preterm      AB  2   Living  1     SAB      IAB      Ectopic      Multiple      Live Births               Home Medications    Prior to Admission medications   Medication Sig Start Date End Date Taking? Authorizing Provider  predniSONE (STERAPRED UNI-PAK 21 TAB) 10 MG (21) TBPK tablet Use as directed on packaging 06/10/20  Yes Peri Jefferson, PA-C  blood glucose meter kit and supplies KIT Dispense based on patient and insurance preference. Use up to four times daily as directed. (FOR ICD-9 250.00, 250.01). 03/17/18   Shawnee Knapp, MD  BYDUREON BCISE 2 MG/0.85ML AUIJ INJECT 2 MG INTO THE SKIN ONCE A WEEK. 07/03/18   Nolon Rod,  Zoe A, MD  ciprofloxacin (CIPRO) 500 MG tablet Take 1 tablet (500 mg total) by mouth 2 (two) times daily. 11/12/19   Alroy Bailiff, Margaux, PA-C  cyclobenzaprine (FLEXERIL) 10 MG tablet Take 1 tablet (10 mg total) by mouth at bedtime. 10/26/17   Shawnee Knapp, MD  famotidine (PEPCID) 20 MG tablet Take 1 tablet (20 mg total) by mouth 2 (two) times daily as needed for heartburn or indigestion. 02/11/18   Shawnee Knapp, MD  famotidine (PEPCID) 40 MG tablet Take 40 mg by mouth 2 (two) times daily as needed. 08/03/19   [provider]  Fluocinolone Acetonide 0.01 % OIL Place 5 drops in ear(s) 2 (two) times daily as needed ("eczema in ears"). 04/21/18   Horald Pollen, MD  glucose blood test strip E11.9. Dispense type to match meter. Use bid to  check sugars. Use as instructed 03/30/18   Shawnee Knapp, MD  HYDROcodone-acetaminophen (NORCO/VICODIN) 5-325 MG tablet Take 1 tablet by mouth every 6 (six) hours as needed for severe pain. 11/12/19   Alroy Bailiff, Margaux, PA-C  ibuprofen (IBU) 800 MG tablet Take 1 tablet (800 mg total) by mouth 3 (three) times daily as needed. 09/07/17   Shawnee Knapp, MD  ipratropium (ATROVENT) 0.06 % nasal spray Place 2 sprays into both nostrils 3 (three) times daily as needed for rhinitis. Reported on 03/23/2015    [provider]  Lancets Misc. MISC E11.9 Dispense type to match meter. 03/30/18   Shawnee Knapp, MD  levocetirizine (XYZAL) 5 MG tablet Take 5 mg by mouth every morning. 07/18/19   [provider]  loratadine (CLARITIN) 10 MG tablet Take 1 tablet (10 mg total) by mouth daily. 02/11/18   Shawnee Knapp, MD  losartan-hydrochlorothiazide (HYZAAR) 100-12.5 MG tablet Take 1 tablet by mouth daily. 10/25/17   Shawnee Knapp, MD  metaxalone (SKELAXIN) 800 MG tablet TAKE 1 TABLET BY MOUTH THREE TIMES A DAY 01/20/18   Shawnee Knapp, MD  metformin (FORTAMET) 1000 MG (OSM) 24 hr tablet TAKE 2 TABLETS (2,000 MG TOTAL) AT BEDTIME 02/11/18   Shawnee Knapp, MD  metFORMIN (GLUCOPHAGE-XR) 500 MG 24 hr tablet Take 2,000 mg by mouth at bedtime. 07/18/19   [provider]  metoprolol succinate (TOPROL-XL) 100 MG 24 hr tablet Take 1 tablet (100 mg total) by mouth daily. Take with or immediately following a meal. 10/25/17   Shawnee Knapp, MD  montelukast (SINGULAIR) 10 MG tablet Take 1 tablet (10 mg total) by mouth at bedtime. 02/11/18   Shawnee Knapp, MD  MULTIPLE VITAMIN PO Take by mouth daily.    [provider]  OZEMPIC, 0.25 OR 0.5 MG/DOSE, 2 MG/1.5ML SOPN Inject into the skin. 05/31/19   [provider]  pantoprazole (PROTONIX) 40 MG tablet Take 1 tablet (40 mg total) by mouth every evening. 02/11/18   Shawnee Knapp, MD  pravastatin (PRAVACHOL) 20 MG tablet TAKE 1 TABLET BY MOUTH EVERY DAY 07/08/18   Jacelyn Pi,  Lilia Argue, MD  predniSONE (STERAPRED UNI-PAK 21 TAB) 10 MG (21) TBPK tablet Take as directed 09/13/18   Hall-Potvin, [place], PA-C  PROAIR HFA 108 (90 Base) MCG/ACT inhaler INHALE 2 PUFFS INTO THE LUNGS EVERY 4 (FOUR) HOURS AS NEEDED FOR WHEEZING OR SHORTNESS OF BREATH. 05/13/18   Shawnee Knapp, MD  sodium chloride (OCEAN) 0.65 % SOLN nasal spray Place 1 spray into both nostrils as needed for congestion. Patient not taking: Reported on 04/21/2018 07/13/16   Brigitte Pulse,  Laurey Arrow, MD  traMADol (ULTRAM) 50 MG tablet Take 1 tablet (50 mg total) by mouth every 8 (eight) hours as needed for severe pain. 04/21/18   Horald Pollen, MD  vitamin B-12 (CYANOCOBALAMIN) 1000 MCG tablet Take 5,000 mcg by mouth daily.    [provider]  Vitamin D, Ergocalciferol, (DRISDOL) 1.25 MG (50000 UT) CAPS capsule TAKE 1 CAPSULE (50,000 UNITS TOTAL) BY MOUTH 3 (THREE) TIMES A WEEK. 01/12/18   Shawnee Knapp, MD    Family History Family History  Problem Relation Age of Onset  . Stroke Mother   . Heart disease Mother   . Cancer Father 11       pancreatic  . Diabetes Father   . Cancer Brother 67       leukemia  . Diabetes Paternal Uncle     Social History Social History   Tobacco Use  . Smoking status: Former Smoker    Quit date: 11/20/2007    Years since quitting: 12.5  . Smokeless tobacco: Never Used  Vaping Use  . Vaping Use: Never used  Substance Use Topics  . Alcohol use: No    Comment: occasional  . Drug use: No     Allergies   Glimepiride, Morphine and related, Colcrys [colchicine], and Lisinopril   Review of Systems Review of Systems  Constitutional: Negative for chills, fatigue and fever.  HENT: Negative for congestion, ear pain, nosebleeds, postnasal drip, rhinorrhea, sinus pressure, sinus pain and sore throat.   Eyes: Negative for pain and redness.  Respiratory: Negative for cough, shortness of breath and wheezing.   Gastrointestinal: Negative for abdominal pain, diarrhea, nausea and  vomiting.  Musculoskeletal: Negative for arthralgias and myalgias.  Skin: Positive for rash.  Neurological: Negative for light-headedness and headaches.  Hematological: Negative for adenopathy. Does not bruise/bleed easily.  Psychiatric/Behavioral: Negative for confusion and sleep disturbance.     Physical Exam Triage Vital Signs ED Triage Vitals  Enc Vitals Group     BP 06/10/20 1219 (!) 152/88     Pulse Rate 06/10/20 1219 73     Resp 06/10/20 1219 16     Temp 06/10/20 1219 98.1 F (36.7 C)     Temp Source 06/10/20 1219 Oral     SpO2 06/10/20 1219 97 %     Weight --      Height --      Head Circumference --      Peak Flow --      Pain Score 06/10/20 1218 0     Pain Loc --      Pain Edu? --      Excl. in [place]? --    No data found.  Updated Vital Signs BP (!) 152/88 (BP Location: Right Arm)   Pulse 73   Temp 98.1 F (36.7 C) (Oral)   Resp 16   SpO2 97%   Visual Acuity Right Eye Distance:   Left Eye Distance:   Bilateral Distance:    Right Eye Near:   Left Eye Near:    Bilateral Near:     Physical Exam Vitals and nursing note reviewed.  Constitutional:      General: She is not in acute distress.    Appearance: Normal appearance. She is not ill-appearing.  HENT:     Head: Normocephalic and atraumatic.  Eyes:     General: No scleral icterus.    Extraocular Movements: Extraocular movements intact.     Conjunctiva/sclera: Conjunctivae normal.  Pulmonary:  Effort: Pulmonary effort is normal. No respiratory distress.  Musculoskeletal:     Cervical back: Normal range of motion. No rigidity.  Lymphadenopathy:     Cervical: No cervical adenopathy.  Skin:    Coloration: Skin is not jaundiced.     Findings: Rash present.     Comments: Vesicular rash on anterior chest Linear vesicular rash on b/l forearms  Neurological:     General: No focal deficit present.     Mental Status: She is alert and oriented to person, place, and time.     Motor: No weakness.      Gait: Gait normal.  Psychiatric:        Mood and Affect: Mood normal.        Behavior: Behavior normal.      UC Treatments / Results  Labs (all labs ordered are listed, but only abnormal results are displayed) Labs Reviewed - No data to display  EKG   Radiology No results found.  Procedures Procedures (including critical care time)  Medications Ordered in UC Medications - No data to display  Initial Impression / Assessment and Plan / UC Course  I have reviewed the triage vital signs and the nursing notes.  Pertinent labs & imaging results that were available during my care of the patient were reviewed by me and considered in my medical decision making (see chart for details).     Monitor blood sugar closely, prednisone will cause blood sugars to rise. Avoid sugars and carbs, including sodas and sports drinks, while on prednisone. Final Clinical Impressions(s) / UC Diagnoses   Final diagnoses:  Irritant contact dermatitis due to plants, except food     Discharge Instructions     Monitor blood sugar, prednisone will cause blood sugar to rise. Avoid sweets, sugars, and carbs while on prednisone.    ED Prescriptions    Medication Sig Dispense Auth. Provider   predniSONE (STERAPRED UNI-PAK 21 TAB) 10 MG (21) TBPK tablet Use as directed on packaging 1 each Peri Jefferson, PA-C     PDMP not reviewed this encounter.   Peri Jefferson, PA-C 06/10/20 1257

## 2020-06-10 NOTE — Discharge Instructions (Signed)
Monitor blood sugar, prednisone will cause blood sugar to rise. Avoid sweets, sugars, and carbs while on prednisone.

## 2021-05-15 ENCOUNTER — Encounter: Payer: Self-pay | Admitting: *Deleted

## 2021-05-23 ENCOUNTER — Ambulatory Visit (INDEPENDENT_AMBULATORY_CARE_PROVIDER_SITE_OTHER): Payer: Managed Care, Other (non HMO)

## 2021-05-23 ENCOUNTER — Ambulatory Visit (INDEPENDENT_AMBULATORY_CARE_PROVIDER_SITE_OTHER): Payer: Managed Care, Other (non HMO) | Admitting: Internal Medicine

## 2021-05-23 ENCOUNTER — Encounter: Payer: Self-pay | Admitting: Internal Medicine

## 2021-05-23 VITALS — BP 120/76 | HR 69 | Ht 64.0 in | Wt 220.6 lb

## 2021-05-23 DIAGNOSIS — R002 Palpitations: Secondary | ICD-10-CM | POA: Diagnosis not present

## 2021-05-23 NOTE — Patient Instructions (Signed)
Medication Instructions:  ?No Changes In Medications at this time.  ?*If you need a refill on your cardiac medications before your next appointment, please call your pharmacy* ? ?Lab Work: ?None Ordered At This Time.  ?If you have labs (blood work) drawn today and your tests are completely normal, you will receive your results only by: ?MyChart Message (if you have MyChart) OR ?A paper copy in the mail ?If you have any lab test that is abnormal or we need to change your treatment, we will call you to review the results. ? ?Testing/Procedures: ? ?ZIO XT- Long Term Monitor Instructions  ? ?Your physician has requested you wear your ZIO patch monitor___7____days.  ? ?This is a single patch monitor.  [employer] supplies one patch monitor per enrollment.  Additional stickers are not available. ?  ?Please do not apply patch if you will be having a Nuclear Stress Test, Echocardiogram, Cardiac CT, MRI, or Chest Xray during the time frame you would be wearing the monitor. The patch cannot be worn during these tests.  You cannot remove and re-apply the ZIO XT patch monitor. ?  ?Your ZIO patch monitor will be sent USPS Priority mail from [facility] directly to your home address. The monitor may also be mailed to a PO BOX if home delivery is not available.   It may take 3-5 days to receive your monitor after you have been enrolled. ?  ?Once you have received you monitor, please review enclosed instructions.  Your monitor has already been registered assigning a specific monitor serial # to you. ?  ?Applying the monitor  ? ?Shave hair from upper left chest. ?  ?Hold abrader disc by orange tab.  Rub abrader in 40 strokes over left upper chest as indicated in your monitor instructions. ?  ?Clean area with 4 enclosed alcohol pads .  Use all pads to assure are is cleaned thoroughly.  Let dry.  ? ?Apply patch as indicated in monitor instructions.  Patch will be place under collarbone on left side of chest with arrow pointing  upward. ?  ?Rub patch adhesive wings for 2 minutes.Remove white label marked "1".  Remove white label marked "2".  Rub patch adhesive wings for 2 additional minutes. ?  ?While looking in a mirror, press and release button in center of patch.  A small green light will flash 3-4 times .  This will be your only indicator the monitor has been turned on. ?    ?Do not shower for the first 24 hours.  You may shower after the first 24 hours. ?  ?Press button if you feel a symptom. You will hear a small click.  Record Date, Time and Symptom in the Patient Log Book. ?  ?When you are ready to remove patch, follow instructions on last 2 pages of Patient Log Book.  Stick patch monitor onto last page of Patient Log Book. ?  ?Place Patient Log Book in [facility] box.  Use locking tab on box and tape box closed securely.  The [employer] has [employer] on it.  Please place in mailbox as soon as possible.  Your physician should have your test results approximately 7 days after the monitor has been mailed back to [facility]. ?  ?Call [facility] at [phone] if you have questions regarding your ZIO XT patch monitor.  Call them immediately if you see an orange light blinking on your monitor. ?  ?If your monitor falls off in less  than 4 days contact our Monitor department at [phone].  If your monitor becomes loose or falls off after 4 days call [employer] at [phone] for suggestions on securing your monitor.  ? ? ?Follow-Up: ?At [facility], you and your health needs are our priority.  As part of our continuing mission to provide you with exceptional heart care, we have created designated Provider Care Teams.  These Care Teams include your primary Cardiologist (physician) and Advanced Practice Providers (APPs -  Physician Assistants and Nurse Practitioners) who all work together to provide you with the care you need, when you need it. ? ?Your next appointment:   ?AS NEEDED  ? ?The format for  your next appointment:   ?In Person ? ?Provider:   ?Janina Mayo, MD   ? ? ? ? ? ? ?  ?

## 2021-05-23 NOTE — Progress Notes (Signed)
?Cardiology Office Note:   ? ?Date:  05/23/2021  ? ?ID:  Donna [name], DOB 08/29/1955, MRN [id number] ? ?PCP:  Caren Macadam, MD ?  ?[facility] Providers ?Cardiologist:  None    ? ?Referring MD: Caren Macadam, MD  ? ?No chief complaint on file. ?Palpitations ? ?History of Present Illness:   ? ?Donna [name] is a 66 y.o. female with a hx of HTN, DM2, chronic pain, endometrial cancer s/p total hysterectomy, gout, s/p per patient partial for a goiter (TSH is normal), former smoker, referral for palpitations ? ?She notes palpitations for a few seconds. She notes persistent episodes. It can happen once per month. No syncope.  No chest pressure. Minimal caffeine. No syncope. ? ?Past Medical History:  ?Diagnosis Date  ? Allergy   ? Arthritis   ? Diabetes mellitus without complication ([place])   ? Difficulty sleeping   ? Diverticulitis   ? Endometrial cancer ([place]) 10/2013  ? grade 1 endometrioid adenocarcinoma, s/p total hysterectomy w/ BSO, lymph node biopsy, LOA for treatment by Dr. Denman George  ? GERD (gastroesophageal reflux disease)   ? History of bronchitis   ? History of gout   ? Hypertension   ? Hypothyroid   ? Skin cancer (melanoma) ([place])   ? to be removed 12/09/13  ? ? ?Past Surgical History:  ?Procedure Laterality Date  ? BREAST BIOPSY Right   ? CHOLECYSTECTOMY    ? ROBOTIC ASSISTED TOTAL HYSTERECTOMY WITH BILATERAL SALPINGO OOPHERECTOMY Bilateral 11/23/2013  ? Procedure: ROBOTIC ASSISTED TOTAL HYSTERECTOMY WITH BILATERAL SALPINGO OOPHORECTOMY, LYMPH NODE DISSECTION ,LYSIS OF ADHESIONS,PELVIC WASHINGS ;  Surgeon: Everitt Amber, MD;  Location: [facility];  Service: Gynecology;  Laterality: Bilateral;  ? skin cancer removal    ? melanoma removed from back   ? SUTURE REMOVAL N/A 11/23/2013  ? Procedure: SUTURE REMOVAL;  Surgeon: Everitt Amber, MD;  Location: [facility];  Service: Gynecology;  Laterality: N/A;  ? THYROIDECTOMY    ? ? ?Current Medications: ?Current Outpatient Medications on File Prior to Visit  ?Medication  Sig Dispense Refill  ? blood glucose meter kit and supplies KIT Dispense based on patient and insurance preference. Use up to four times daily as directed. (FOR ICD-9 250.00, 250.01). 1 each 0  ? ciprofloxacin (CIPRO) 500 MG tablet Take 1 tablet (500 mg total) by mouth 2 (two) times daily. 14 tablet 0  ? cyclobenzaprine (FLEXERIL) 10 MG tablet Take 1 tablet (10 mg total) by mouth at bedtime. 90 tablet 1  ? famotidine (PEPCID) 40 MG tablet Take 40 mg by mouth 2 (two) times daily as needed.    ? FARXIGA 5 MG TABS tablet Take 5 mg by mouth daily.    ? Fluocinolone Acetonide 0.01 % OIL Place 5 drops in ear(s) 2 (two) times daily as needed ("eczema in ears"). 20 mL 5  ? glipiZIDE (GLUCOTROL XL) 10 MG 24 hr tablet Take 10 mg by mouth every morning.    ? glucose blood test strip E11.9. Dispense type to match meter. Use bid to check sugars. Use as instructed 100 each 1  ? HYDROcodone-acetaminophen (NORCO/VICODIN) 5-325 MG tablet Take 1 tablet by mouth every 6 (six) hours as needed for severe pain. 15 tablet 0  ? ipratropium (ATROVENT) 0.06 % nasal spray Place 2 sprays into both nostrils 3 (three) times daily as needed for rhinitis. Reported on 03/23/2015    ? Lancets Misc. MISC E11.9 Dispense type to match meter. 100 each 1  ? levocetirizine (XYZAL) 5 MG tablet Take  5 mg by mouth every morning.    ? loratadine (CLARITIN) 10 MG tablet Take 1 tablet (10 mg total) by mouth daily. 30 tablet 11  ? losartan-hydrochlorothiazide (HYZAAR) 100-12.5 MG tablet Take 1 tablet by mouth daily. 90 tablet 3  ? metaxalone (SKELAXIN) 800 MG tablet TAKE 1 TABLET BY MOUTH THREE TIMES A DAY (Patient taking differently: daily as needed.) 30 tablet 3  ? metFORMIN (GLUCOPHAGE) 500 MG tablet Take 500 mg by mouth 2 (two) times daily.    ? metFORMIN (GLUCOPHAGE-XR) 500 MG 24 hr tablet Take 2,000 mg by mouth at bedtime.    ? metoprolol succinate (TOPROL-XL) 100 MG 24 hr tablet Take 1 tablet (100 mg total) by mouth daily. Take with or immediately  following a meal. 90 tablet 3  ? montelukast (SINGULAIR) 10 MG tablet Take 1 tablet (10 mg total) by mouth at bedtime. 90 tablet 3  ? MULTIPLE VITAMIN PO Take by mouth daily.    ? pantoprazole (PROTONIX) 40 MG tablet Take 1 tablet (40 mg total) by mouth every evening. 90 tablet 1  ? pravastatin (PRAVACHOL) 20 MG tablet TAKE 1 TABLET BY MOUTH EVERY DAY 90 tablet 0  ? PROAIR HFA 108 (90 Base) MCG/ACT inhaler INHALE 2 PUFFS INTO THE LUNGS EVERY 4 (FOUR) HOURS AS NEEDED FOR WHEEZING OR SHORTNESS OF BREATH. 8.5 Inhaler 1  ? traMADol (ULTRAM) 50 MG tablet Take 1 tablet (50 mg total) by mouth every 8 (eight) hours as needed for severe pain. 60 tablet 0  ? vitamin B-12 (CYANOCOBALAMIN) 1000 MCG tablet Take 5,000 mcg by mouth daily.    ? Vitamin D, Ergocalciferol, (DRISDOL) 1.25 MG (50000 UT) CAPS capsule TAKE 1 CAPSULE (50,000 UNITS TOTAL) BY MOUTH 3 (THREE) TIMES A WEEK. 36 capsule 2  ? BYDUREON BCISE 2 MG/0.85ML AUIJ INJECT 2 MG INTO THE SKIN ONCE A WEEK. (Patient not taking: Reported on 05/23/2021) 12 pen 0  ? famotidine (PEPCID) 20 MG tablet Take 1 tablet (20 mg total) by mouth 2 (two) times daily as needed for heartburn or indigestion. (Patient not taking: Reported on 05/23/2021) 180 tablet 3  ? ibuprofen (IBU) 800 MG tablet Take 1 tablet (800 mg total) by mouth 3 (three) times daily as needed. (Patient not taking: Reported on 05/23/2021) 270 tablet 0  ? metformin (FORTAMET) 1000 MG (OSM) 24 hr tablet TAKE 2 TABLETS (2,000 MG TOTAL) AT BEDTIME (Patient not taking: Reported on 05/23/2021) 180 tablet 3  ? OZEMPIC, 0.25 OR 0.5 MG/DOSE, 2 MG/1.5ML SOPN Inject into the skin. (Patient not taking: Reported on 05/23/2021)    ? predniSONE (STERAPRED UNI-PAK 21 TAB) 10 MG (21) TBPK tablet Take as directed (Patient not taking: Reported on 05/23/2021) 21 tablet 0  ? predniSONE (STERAPRED UNI-PAK 21 TAB) 10 MG (21) TBPK tablet Use as directed on packaging (Patient not taking: Reported on 05/23/2021) 1 each 0  ? sodium chloride (OCEAN)  0.65 % SOLN nasal spray Place 1 spray into both nostrils as needed for congestion. (Patient not taking: Reported on 04/21/2018) 1 Bottle 0  ? ?No current facility-administered medications on file prior to visit.  ? ? ? ?Allergies:   Glimepiride, Morphine and related, Semaglutide(0.25 or 0.67m-dos), Colcrys [colchicine], and Lisinopril  ? ?Social History  ? ?Socioeconomic History  ? Marital status: Single  ?  Spouse name: Not on file  ? Number of children: Not on file  ? Years of education: Not on file  ? Highest education level: Not on file  ?Occupational History  ?  Not on file  ?Tobacco Use  ? Smoking status: Former  ?  Types: Cigarettes  ?  Quit date: 11/20/2007  ?  Years since quitting: 13.5  ? Smokeless tobacco: Never  ?Vaping Use  ? Vaping Use: Never used  ?Substance and Sexual Activity  ? Alcohol use: No  ?  Comment: occasional  ? Drug use: No  ? Sexual activity: Never  ?Other Topics Concern  ? Not on file  ?Social History Narrative  ? Not on file  ? ?Social Determinants of Health  ? ?Financial Resource Strain: Not on file  ?Food Insecurity: Not on file  ?Transportation Needs: Not on file  ?Physical Activity: Not on file  ?Stress: Not on file  ?Social Connections: Not on file  ?  ? ?Family History: ?The patient's family history includes Cancer (age of onset: 61) in her brother; Cancer (age of onset: 80) in her father; Diabetes in her father and paternal uncle; Heart disease in her mother; Stroke in her mother. Mother had MI late 49s ? ?ROS:   ?Please see the history of present illness.    ? All other systems reviewed and are negative. ? ?EKGs/Labs/Other Studies Reviewed:   ? ?The following studies were reviewed today: ? ? ?EKG:  EKG is  ordered today.  The ekg ordered today demonstrates  ? ?NSR ? ?Recent Labs: ?No results found for requested labs within last 8760 hours.  ?Recent Lipid Panel ?   ?Component Value Date/Time  ? CHOL 130 04/23/2018 1422  ? TRIG 280 (H) 04/23/2018 1422  ? HDL 34 (L) 04/23/2018 1422   ? CHOLHDL 3.8 04/23/2018 1422  ? CHOLHDL 4.4 08/24/2015 0941  ? VLDL 39 (H) 08/24/2015 0941  ? Norco 40 04/23/2018 1422  ? LDLDIRECT 101 04/20/2015 1536  ? ? ? ?Risk Assessment/Calculations:   ?  ? ?    ? ?Physi

## 2021-05-23 NOTE — Progress Notes (Unsigned)
Enrolled for [employer] to mail a ZIO XT long term holter monitor to the patients address on file.  

## 2021-05-27 DIAGNOSIS — R002 Palpitations: Secondary | ICD-10-CM | POA: Diagnosis not present

## 2021-06-13 ENCOUNTER — Telehealth: Payer: Self-pay | Admitting: Internal Medicine

## 2021-06-13 NOTE — Telephone Encounter (Signed)
Pt called returning call in regards to heart monitor results.  ? ?ZQJS:[phone] ?Mobile : [phone] ?

## 2021-06-13 NOTE — Telephone Encounter (Signed)
-  Pt made aware of monitor results along with MD's recommendations. ?-Pt state she is already taking metoprolol XL 100 mg daily ?-Will forward to MD to make aware ? ? Janina Mayo, MD  ?06/12/2021 12:47 PM EDT   ?  ?Hi Jenna, please let Donna [name] know that she had some runs of fast heart rates. Please start metoprolol 25 mg XL daily and arrange a follow up in 2 months  ? ?

## 2021-06-13 NOTE — Telephone Encounter (Signed)
Left message to call back  

## 2021-06-14 MED ORDER — METOPROLOL SUCCINATE ER 25 MG PO TB24: 25.0000 mg | ORAL_TABLET | Freq: Every day | ORAL | 6 refills | Status: DC

## 2021-06-14 MED ORDER — METOPROLOL SUCCINATE ER 100 MG PO TB24: 100.0000 mg | ORAL_TABLET | Freq: Every day | ORAL | 3 refills | Status: DC

## 2021-06-14 NOTE — Telephone Encounter (Signed)
Patient returning call.

## 2021-06-14 NOTE — Telephone Encounter (Signed)
Spoke with pt, she will increase metoprolol to '125mg'$ . Verified pharmacy. New rx sent to requested pharmacy

## 2021-09-06 ENCOUNTER — Ambulatory Visit: Payer: Managed Care, Other (non HMO) | Admitting: Internal Medicine

## 2021-09-19 ENCOUNTER — Ambulatory Visit: Payer: Medicare Other | Admitting: Internal Medicine

## 2021-09-19 ENCOUNTER — Encounter: Payer: Self-pay | Admitting: Internal Medicine

## 2021-09-19 VITALS — BP 134/76 | HR 65 | Ht 64.0 in | Wt 225.0 lb

## 2021-09-19 DIAGNOSIS — R002 Palpitations: Secondary | ICD-10-CM

## 2021-09-19 MED ORDER — METOPROLOL SUCCINATE ER 100 MG PO TB24: 150.0000 mg | ORAL_TABLET | Freq: Every day | ORAL | 5 refills | Status: DC

## 2021-09-19 NOTE — Patient Instructions (Signed)
Medication Instructions:   INCREASE METOPROLOL SUCCINATE TO '150mg'$  ONCE DAILY- PLEASE LET us KNOW IF THIS IS TOO MUCH OR YOUR HAVING PROBLEMS  *If you need a refill on your cardiac medications before your next appointment, please call your pharmacy*  Lab Work: None Ordered At This Time.  If you have labs (blood work) drawn today and your tests are completely normal, you will receive your results only by: [place] (if you have MyChart) OR A paper copy in the mail If you have any lab test that is abnormal or we need to change your treatment, we will call you to review the results.  Testing/Procedures: None Ordered At This Time.   Follow-Up: At [facility], you and your health needs are our priority.  As part of our continuing mission to provide you with exceptional heart care, we have created designated Provider Care Teams.  These Care Teams include your primary Cardiologist (physician) and Advanced Practice Providers (APPs -  Physician Assistants and Nurse Practitioners) who all work together to provide you with the care you need, when you need it.  Your next appointment:   1 year(s)  The format for your next appointment:   In Person  Provider:   Janina Mayo, MD

## 2021-09-19 NOTE — Progress Notes (Signed)
Cardiology Office Note:    Date:  09/19/2021   ID:  Donna [name], DOB 03/16/1955, MRN [id number]  PCP:  Hagler, Rachel, MD   [facility] Providers Cardiologist:  Branch, Mary E, MD     Referring MD: Hagler, Rachel, MD   Chief Complaint  Patient presents with   Follow-up    2 months.  Palpitations  History of Present Illness:    Donna [name] is a 65 y.o. female with a hx of HTN, DM2, chronic pain, endometrial cancer s/p total hysterectomy, gout, s/p per patient partial for a goiter (TSH is normal), former smoker, referral for palpitations  She notes palpitations for a few seconds. She notes persistent episodes. It can happen once per month. No syncope.  No chest pressure. Minimal caffeine. No syncope.  Interim 09/19/2021 Her symptoms have improved. She is active with her grandkids.  Past Medical History:  Diagnosis Date   Allergy    Arthritis    Diabetes mellitus without complication (HCC)    Difficulty sleeping    Diverticulitis    Endometrial cancer (HCC) 10/2013   grade 1 endometrioid adenocarcinoma, s/p total hysterectomy w/ BSO, lymph node biopsy, LOA for treatment by Dr. Rossi   GERD (gastroesophageal reflux disease)    History of bronchitis    History of gout    Hypertension    Hypothyroid    Skin cancer (melanoma) (HCC)    to be removed 12/09/13    Past Surgical History:  Procedure Laterality Date   BREAST BIOPSY Right    CHOLECYSTECTOMY     ROBOTIC ASSISTED TOTAL HYSTERECTOMY WITH BILATERAL SALPINGO OOPHERECTOMY Bilateral 11/23/2013   Procedure: ROBOTIC ASSISTED TOTAL HYSTERECTOMY WITH BILATERAL SALPINGO OOPHORECTOMY, LYMPH NODE DISSECTION ,LYSIS OF ADHESIONS,PELVIC WASHINGS ;  Surgeon: Emma Rossi, MD;  Location: [facility];  Service: Gynecology;  Laterality: Bilateral;   skin cancer removal     melanoma removed from back    SUTURE REMOVAL N/A 11/23/2013   Procedure: SUTURE REMOVAL;  Surgeon: Emma Rossi, MD;  Location: [facility];  Service:  Gynecology;  Laterality: N/A;   THYROIDECTOMY      Current Medications: Current Outpatient Medications on File Prior to Visit  Medication Sig Dispense Refill   blood glucose meter kit and supplies KIT Dispense based on patient and insurance preference. Use up to four times daily as directed. (FOR ICD-9 250.00, 250.01). 1 each 0   BYDUREON BCISE 2 MG/0.85ML AUIJ INJECT 2 MG INTO THE SKIN ONCE A WEEK. 12 pen 0   ciprofloxacin (CIPRO) 500 MG tablet Take 1 tablet (500 mg total) by mouth 2 (two) times daily. 14 tablet 0   cyclobenzaprine (FLEXERIL) 10 MG tablet Take 1 tablet (10 mg total) by mouth at bedtime. 90 tablet 1   famotidine (PEPCID) 20 MG tablet Take 1 tablet (20 mg total) by mouth 2 (two) times daily as needed for heartburn or indigestion. 180 tablet 3   famotidine (PEPCID) 40 MG tablet Take 40 mg by mouth 2 (two) times daily as needed.     FARXIGA 5 MG TABS tablet Take 5 mg by mouth daily.     Fluocinolone Acetonide 0.01 % OIL Place 5 drops in ear(s) 2 (two) times daily as needed ("eczema in ears"). 20 mL 5   glipiZIDE (GLUCOTROL XL) 10 MG 24 hr tablet Take 10 mg by mouth every morning.     glucose blood test strip E11.9. Dispense type to match meter. Use bid to check sugars. Use as instructed 100 each 1     HYDROcodone-acetaminophen (NORCO/VICODIN) 5-325 MG tablet Take 1 tablet by mouth every 6 (six) hours as needed for severe pain. 15 tablet 0   ibuprofen (IBU) 800 MG tablet Take 1 tablet (800 mg total) by mouth 3 (three) times daily as needed. 270 tablet 0   ipratropium (ATROVENT) 0.06 % nasal spray Place 2 sprays into both nostrils 3 (three) times daily as needed for rhinitis. Reported on 03/23/2015     Lancets Misc. MISC E11.9 Dispense type to match meter. 100 each 1   levocetirizine (XYZAL) 5 MG tablet Take 5 mg by mouth every morning.     loratadine (CLARITIN) 10 MG tablet Take 1 tablet (10 mg total) by mouth daily. 30 tablet 11   losartan-hydrochlorothiazide (HYZAAR) 100-12.5 MG  tablet Take 1 tablet by mouth daily. 90 tablet 3   metaxalone (SKELAXIN) 800 MG tablet TAKE 1 TABLET BY MOUTH THREE TIMES A DAY (Patient taking differently: daily as needed.) 30 tablet 3   metformin (FORTAMET) 1000 MG (OSM) 24 hr tablet TAKE 2 TABLETS (2,000 MG TOTAL) AT BEDTIME 180 tablet 3   metFORMIN (GLUCOPHAGE) 500 MG tablet Take 500 mg by mouth 2 (two) times daily.     metFORMIN (GLUCOPHAGE-XR) 500 MG 24 hr tablet Take 2,000 mg by mouth at bedtime.     metoprolol succinate (TOPROL XL) 25 MG 24 hr tablet Take 1 tablet (25 mg total) by mouth daily. Take with 163m to make 1226m30 tablet 6   metoprolol succinate (TOPROL-XL) 100 MG 24 hr tablet Take 1 tablet (100 mg total) by mouth daily. Take with 2532mo make 125m36make with or immediately following a meal. 90 tablet 3   montelukast (SINGULAIR) 10 MG tablet Take 1 tablet (10 mg total) by mouth at bedtime. 90 tablet 3   MULTIPLE VITAMIN PO Take by mouth daily.     OZEMPIC, 0.25 OR 0.5 MG/DOSE, 2 MG/1.5ML SOPN Inject into the skin.     pantoprazole (PROTONIX) 40 MG tablet Take 1 tablet (40 mg total) by mouth every evening. 90 tablet 1   pravastatin (PRAVACHOL) 20 MG tablet TAKE 1 TABLET BY MOUTH EVERY DAY 90 tablet 0   predniSONE (STERAPRED UNI-PAK 21 TAB) 10 MG (21) TBPK tablet Take as directed 21 tablet 0   predniSONE (STERAPRED UNI-PAK 21 TAB) 10 MG (21) TBPK tablet Use as directed on packaging 1 each 0   PROAIR HFA 108 (90 Base) MCG/ACT inhaler INHALE 2 PUFFS INTO THE LUNGS EVERY 4 (FOUR) HOURS AS NEEDED FOR WHEEZING OR SHORTNESS OF BREATH. 8.5 Inhaler 1   sodium chloride (OCEAN) 0.65 % SOLN nasal spray Place 1 spray into both nostrils as needed for congestion. 1 Bottle 0   traMADol (ULTRAM) 50 MG tablet Take 1 tablet (50 mg total) by mouth every 8 (eight) hours as needed for severe pain. 60 tablet 0   vitamin B-12 (CYANOCOBALAMIN) 1000 MCG tablet Take 5,000 mcg by mouth daily.     Vitamin D, Ergocalciferol, (DRISDOL) 1.25 MG (50000 UT)  CAPS capsule TAKE 1 CAPSULE (50,000 UNITS TOTAL) BY MOUTH 3 (THREE) TIMES A WEEK. 36 capsule 2   No current facility-administered medications on file prior to visit.     Allergies:   Glimepiride, Morphine and related, Semaglutide(0.25 or 0.5mg-45m), Colcrys [colchicine], and Lisinopril   Social History   Socioeconomic History   Marital status: Single    Spouse name: Not on file   Number of children: Not on file   Years of education: Not on file  Highest education level: Not on file  Occupational History   Not on file  Tobacco Use   Smoking status: Former    Types: Cigarettes    Quit date: 11/20/2007    Years since quitting: 13.8   Smokeless tobacco: Never  Vaping Use   Vaping Use: Never used  Substance and Sexual Activity   Alcohol use: No    Comment: occasional   Drug use: No   Sexual activity: Never  Other Topics Concern   Not on file  Social History Narrative   Not on file   Social Determinants of Health   Financial Resource Strain: Not on file  Food Insecurity: Not on file  Transportation Needs: Not on file  Physical Activity: Not on file  Stress: Not on file  Social Connections: Not on file     Family History: The patient's family history includes Cancer (age of onset: 50) in her brother; Cancer (age of onset: 70) in her father; Diabetes in her father and paternal uncle; Heart disease in her mother; Stroke in her mother. Mother had MI late 60s  ROS:   Please see the history of present illness.     All other systems reviewed and are negative.  EKGs/Labs/Other Studies Reviewed:    The following studies were reviewed today:   EKG:  EKG is  ordered today.  The ekg ordered today demonstrates   NSR  09/19/2021- NSR   Recent Labs: No results found for requested labs within last 365 days.  Recent Lipid Panel    Component Value Date/Time   CHOL 130 04/23/2018 1422   TRIG 280 (H) 04/23/2018 1422   HDL 34 (L) 04/23/2018 1422   CHOLHDL 3.8 04/23/2018  1422   CHOLHDL 4.4 08/24/2015 0941   VLDL 39 (H) 08/24/2015 0941   LDLCALC 40 04/23/2018 1422   LDLDIRECT 101 04/20/2015 1536     Risk Assessment/Calculations:           Physical Exam:    VS:   Vitals:   09/19/21 0843  BP: 134/76  Pulse: 65      Wt Readings from Last 3 Encounters:  05/23/21 220 lb 9.6 oz (100.1 kg)  11/12/19 217 lb 9.5 oz (98.7 kg)  12/18/17 236 lb (107 kg)     GEN:  Well nourished, well developed in no acute distress HEENT: Normal NECK: No JVD LYMPHATICS: No lymphadenopathy CARDIAC: RRR, no murmurs, rubs, gallops RESPIRATORY:  Clear to auscultation without rales, wheezing or rhonchi  ABDOMEN: Soft, non-tender, non-distended MUSCULOSKELETAL:  No edema; No deformity  SKIN: Warm and dry NEUROLOGIC:  Alert and oriented x 3 PSYCHIATRIC:  Normal affect   ASSESSMENT:    SVT: Zio showed episodes of SVT. She was on metop 100mg daily and this was increased to metop 125 XL. Recommended cut back on caffeine.  There was some confusion with the pharmacy, no easy way to dispense 125. Will do metop XL 150 mg daily  HTN: well controlled. continue losartan/HCTz 100-12.5 mg   PLAN:    In order of problems listed above:  Follow up in on year         Medication Adjustments/Labs and Tests Ordered: Current medicines are reviewed at length with the patient today.  Concerns regarding medicines are outlined above.  No orders of the defined types were placed in this encounter.  No orders of the defined types were placed in this encounter.   There are no Patient Instructions on file for this visit.   Signed,   Janina Mayo, MD  09/19/2021 8:51 AM    [facility]

## 2021-10-25 DIAGNOSIS — Z7984 Long term (current) use of oral hypoglycemic drugs: Secondary | ICD-10-CM | POA: Diagnosis not present

## 2021-10-25 DIAGNOSIS — G894 Chronic pain syndrome: Secondary | ICD-10-CM | POA: Diagnosis not present

## 2021-10-25 DIAGNOSIS — L309 Dermatitis, unspecified: Secondary | ICD-10-CM | POA: Diagnosis not present

## 2021-10-25 DIAGNOSIS — Z23 Encounter for immunization: Secondary | ICD-10-CM | POA: Diagnosis not present

## 2021-10-25 DIAGNOSIS — I10 Essential (primary) hypertension: Secondary | ICD-10-CM | POA: Diagnosis not present

## 2021-10-25 DIAGNOSIS — Z85820 Personal history of malignant melanoma of skin: Secondary | ICD-10-CM | POA: Diagnosis not present

## 2021-10-25 DIAGNOSIS — E7800 Pure hypercholesterolemia, unspecified: Secondary | ICD-10-CM | POA: Diagnosis not present

## 2021-10-25 DIAGNOSIS — E118 Type 2 diabetes mellitus with unspecified complications: Secondary | ICD-10-CM | POA: Diagnosis not present

## 2021-12-13 ENCOUNTER — Other Ambulatory Visit: Payer: Self-pay | Admitting: Internal Medicine

## 2022-02-20 DIAGNOSIS — E118 Type 2 diabetes mellitus with unspecified complications: Secondary | ICD-10-CM | POA: Diagnosis not present

## 2022-02-20 DIAGNOSIS — E119 Type 2 diabetes mellitus without complications: Secondary | ICD-10-CM | POA: Diagnosis not present

## 2022-02-20 DIAGNOSIS — E559 Vitamin D deficiency, unspecified: Secondary | ICD-10-CM | POA: Diagnosis not present

## 2022-02-20 DIAGNOSIS — E890 Postprocedural hypothyroidism: Secondary | ICD-10-CM | POA: Diagnosis not present

## 2022-02-20 DIAGNOSIS — I10 Essential (primary) hypertension: Secondary | ICD-10-CM | POA: Diagnosis not present

## 2022-02-20 DIAGNOSIS — Z0000 Encounter for general adult medical examination without abnormal findings: Secondary | ICD-10-CM | POA: Diagnosis not present

## 2022-02-20 DIAGNOSIS — Z1211 Encounter for screening for malignant neoplasm of colon: Secondary | ICD-10-CM | POA: Diagnosis not present

## 2022-02-20 DIAGNOSIS — Z85820 Personal history of malignant melanoma of skin: Secondary | ICD-10-CM | POA: Diagnosis not present

## 2022-02-20 DIAGNOSIS — Z1159 Encounter for screening for other viral diseases: Secondary | ICD-10-CM | POA: Diagnosis not present

## 2022-02-20 DIAGNOSIS — I471 Supraventricular tachycardia, unspecified: Secondary | ICD-10-CM | POA: Diagnosis not present

## 2022-02-20 DIAGNOSIS — E7800 Pure hypercholesterolemia, unspecified: Secondary | ICD-10-CM | POA: Diagnosis not present

## 2022-02-20 DIAGNOSIS — D75839 Thrombocytosis, unspecified: Secondary | ICD-10-CM | POA: Diagnosis not present

## 2022-02-20 DIAGNOSIS — Z780 Asymptomatic menopausal state: Secondary | ICD-10-CM | POA: Diagnosis not present

## 2022-02-21 ENCOUNTER — Other Ambulatory Visit: Payer: Self-pay | Admitting: Family Medicine

## 2022-02-21 DIAGNOSIS — Z1231 Encounter for screening mammogram for malignant neoplasm of breast: Secondary | ICD-10-CM

## 2022-02-21 DIAGNOSIS — Z780 Asymptomatic menopausal state: Secondary | ICD-10-CM

## 2022-03-06 ENCOUNTER — Ambulatory Visit
Admission: RE | Admit: 2022-03-06 | Discharge: 2022-03-06 | Disposition: A | Discharge status: Home / Self Care | Payer: 59 | Source: Ambulatory Visit | Attending: Family Medicine | Admitting: Family Medicine

## 2022-03-06 DIAGNOSIS — Z1231 Encounter for screening mammogram for malignant neoplasm of breast: Secondary | ICD-10-CM

## 2022-03-26 ENCOUNTER — Ambulatory Visit
Admission: RE | Admit: 2022-03-26 | Discharge: 2022-03-26 | Disposition: A | Discharge status: Home / Self Care | Payer: 59 | Source: Ambulatory Visit | Attending: Family Medicine | Admitting: Family Medicine

## 2022-03-26 DIAGNOSIS — D1721 Benign lipomatous neoplasm of skin and subcutaneous tissue of right arm: Secondary | ICD-10-CM | POA: Diagnosis not present

## 2022-03-26 DIAGNOSIS — Z780 Asymptomatic menopausal state: Secondary | ICD-10-CM | POA: Diagnosis not present

## 2022-03-26 DIAGNOSIS — M85832 Other specified disorders of bone density and structure, left forearm: Secondary | ICD-10-CM | POA: Diagnosis not present

## 2022-03-26 DIAGNOSIS — L720 Epidermal cyst: Secondary | ICD-10-CM | POA: Diagnosis not present

## 2022-03-26 DIAGNOSIS — D171 Benign lipomatous neoplasm of skin and subcutaneous tissue of trunk: Secondary | ICD-10-CM | POA: Diagnosis not present

## 2022-03-26 DIAGNOSIS — Z85820 Personal history of malignant melanoma of skin: Secondary | ICD-10-CM | POA: Diagnosis not present

## 2022-03-26 DIAGNOSIS — D1801 Hemangioma of skin and subcutaneous tissue: Secondary | ICD-10-CM | POA: Diagnosis not present

## 2022-03-26 DIAGNOSIS — D1722 Benign lipomatous neoplasm of skin and subcutaneous tissue of left arm: Secondary | ICD-10-CM | POA: Diagnosis not present

## 2022-05-09 DIAGNOSIS — H538 Other visual disturbances: Secondary | ICD-10-CM | POA: Diagnosis not present

## 2022-05-09 DIAGNOSIS — E1165 Type 2 diabetes mellitus with hyperglycemia: Secondary | ICD-10-CM | POA: Diagnosis not present

## 2022-05-09 DIAGNOSIS — H2511 Age-related nuclear cataract, right eye: Secondary | ICD-10-CM | POA: Diagnosis not present

## 2022-05-22 DIAGNOSIS — E559 Vitamin D deficiency, unspecified: Secondary | ICD-10-CM | POA: Diagnosis not present

## 2022-05-22 DIAGNOSIS — M79641 Pain in right hand: Secondary | ICD-10-CM | POA: Diagnosis not present

## 2022-05-22 DIAGNOSIS — E118 Type 2 diabetes mellitus with unspecified complications: Secondary | ICD-10-CM | POA: Diagnosis not present

## 2022-05-22 DIAGNOSIS — I471 Supraventricular tachycardia, unspecified: Secondary | ICD-10-CM | POA: Diagnosis not present

## 2022-05-22 DIAGNOSIS — E7800 Pure hypercholesterolemia, unspecified: Secondary | ICD-10-CM | POA: Diagnosis not present

## 2022-05-22 DIAGNOSIS — I10 Essential (primary) hypertension: Secondary | ICD-10-CM | POA: Diagnosis not present

## 2022-05-22 DIAGNOSIS — E1165 Type 2 diabetes mellitus with hyperglycemia: Secondary | ICD-10-CM | POA: Diagnosis not present

## 2022-05-22 DIAGNOSIS — K219 Gastro-esophageal reflux disease without esophagitis: Secondary | ICD-10-CM | POA: Diagnosis not present

## 2022-06-11 DIAGNOSIS — M1811 Unilateral primary osteoarthritis of first carpometacarpal joint, right hand: Secondary | ICD-10-CM | POA: Diagnosis not present

## 2022-07-02 DIAGNOSIS — M1811 Unilateral primary osteoarthritis of first carpometacarpal joint, right hand: Secondary | ICD-10-CM | POA: Diagnosis not present

## 2022-07-03 DIAGNOSIS — R062 Wheezing: Secondary | ICD-10-CM | POA: Diagnosis not present

## 2022-07-03 DIAGNOSIS — L239 Allergic contact dermatitis, unspecified cause: Secondary | ICD-10-CM | POA: Diagnosis not present

## 2022-07-03 DIAGNOSIS — J3089 Other allergic rhinitis: Secondary | ICD-10-CM | POA: Diagnosis not present

## 2022-08-28 DIAGNOSIS — J302 Other seasonal allergic rhinitis: Secondary | ICD-10-CM | POA: Diagnosis not present

## 2022-08-28 DIAGNOSIS — Z23 Encounter for immunization: Secondary | ICD-10-CM | POA: Diagnosis not present

## 2022-08-28 DIAGNOSIS — I10 Essential (primary) hypertension: Secondary | ICD-10-CM | POA: Diagnosis not present

## 2022-08-28 DIAGNOSIS — E7800 Pure hypercholesterolemia, unspecified: Secondary | ICD-10-CM | POA: Diagnosis not present

## 2022-08-28 DIAGNOSIS — J4520 Mild intermittent asthma, uncomplicated: Secondary | ICD-10-CM | POA: Diagnosis not present

## 2022-08-28 DIAGNOSIS — E119 Type 2 diabetes mellitus without complications: Secondary | ICD-10-CM | POA: Diagnosis not present

## 2022-08-28 DIAGNOSIS — G894 Chronic pain syndrome: Secondary | ICD-10-CM | POA: Diagnosis not present

## 2022-08-28 DIAGNOSIS — E118 Type 2 diabetes mellitus with unspecified complications: Secondary | ICD-10-CM | POA: Diagnosis not present

## 2022-10-01 ENCOUNTER — Ambulatory Visit: Payer: 59 | Admitting: Internal Medicine

## 2022-11-05 ENCOUNTER — Ambulatory Visit: Payer: 59 | Attending: Internal Medicine | Admitting: Internal Medicine

## 2022-11-05 ENCOUNTER — Encounter: Payer: Self-pay | Admitting: Internal Medicine

## 2022-11-05 VITALS — BP 130/80 | HR 71 | Ht 64.0 in | Wt 235.4 lb

## 2022-11-05 DIAGNOSIS — I10 Essential (primary) hypertension: Secondary | ICD-10-CM

## 2022-11-05 NOTE — Progress Notes (Signed)
Cardiology Office Note:    Date:  11/05/2022   ID:  Donna [name] September 09, 1955, MRN [id number]  PCP:  Aliene Beams, MD   [facility] Providers Cardiologist:  Maisie Fus, MD     Referring MD: Aliene Beams, MD   No chief complaint on file. Palpitations  History of Present Illness:    Donna [name] is a 67 y.o. female with a hx of HTN, DM2, chronic pain, endometrial cancer s/p total hysterectomy, gout, s/p per patient partial for a goiter (TSH is normal), former smoker, referral for palpitations  She notes palpitations for a few seconds. She notes persistent episodes. It can happen once per month. No syncope.  No chest pressure. Minimal caffeine. No syncope.  Interim 09/19/2021 Her symptoms have improved. She is active with her grandkids.  Interval hx 11/05/2022 She lost her brother in September. She notes weight gain with brother's loss. No significant palpitations.  No chest pain.  Past Medical History:  Diagnosis Date   Allergy    Arthritis    Diabetes mellitus without complication (HCC)    Difficulty sleeping    Diverticulitis    Endometrial cancer (HCC) 10/2013   grade 1 endometrioid adenocarcinoma, s/p total hysterectomy w/ BSO, lymph node biopsy, LOA for treatment by Dr. Andrey Farmer   GERD (gastroesophageal reflux disease)    History of bronchitis    History of gout    Hypertension    Hypothyroid    Skin cancer (melanoma) (HCC)    to be removed 12/09/13    Past Surgical History:  Procedure Laterality Date   BREAST BIOPSY Right    CHOLECYSTECTOMY     ROBOTIC ASSISTED TOTAL HYSTERECTOMY WITH BILATERAL SALPINGO OOPHERECTOMY Bilateral 11/23/2013   Procedure: ROBOTIC ASSISTED TOTAL HYSTERECTOMY WITH BILATERAL SALPINGO OOPHORECTOMY, LYMPH NODE DISSECTION ,LYSIS OF ADHESIONS,PELVIC WASHINGS ;  Surgeon: Adolphus Birchwood, MD;  Location: [facility];  Service: Gynecology;  Laterality: Bilateral;   skin cancer removal     melanoma removed from back    SUTURE REMOVAL  N/A 11/23/2013   Procedure: SUTURE REMOVAL;  Surgeon: Adolphus Birchwood, MD;  Location: [facility];  Service: Gynecology;  Laterality: N/A;   THYROIDECTOMY      Current Medications: Current Outpatient Medications on File Prior to Visit  Medication Sig Dispense Refill   blood glucose meter kit and supplies KIT Dispense based on patient and insurance preference. Use up to four times daily as directed. (FOR ICD-9 250.00, 250.01). 1 each 0   BYDUREON BCISE 2 MG/0.85ML AUIJ INJECT 2 MG INTO THE SKIN ONCE A WEEK. 12 pen 0   ciprofloxacin (CIPRO) 500 MG tablet Take 1 tablet (500 mg total) by mouth 2 (two) times daily. 14 tablet 0   cyclobenzaprine (FLEXERIL) 10 MG tablet Take 1 tablet (10 mg total) by mouth at bedtime. 90 tablet 1   famotidine (PEPCID) 20 MG tablet Take 1 tablet (20 mg total) by mouth 2 (two) times daily as needed for heartburn or indigestion. 180 tablet 3   famotidine (PEPCID) 40 MG tablet Take 40 mg by mouth 2 (two) times daily as needed.     FARXIGA 5 MG TABS tablet Take 5 mg by mouth daily.     Fluocinolone Acetonide 0.01 % OIL Place 5 drops in ear(s) 2 (two) times daily as needed ("eczema in ears"). 20 mL 5   glipiZIDE (GLUCOTROL XL) 10 MG 24 hr tablet Take 10 mg by mouth every morning.     glucose blood test strip E11.9. Dispense type  to match meter. Use bid to check sugars. Use as instructed 100 each 1   HYDROcodone-acetaminophen (NORCO/VICODIN) 5-325 MG tablet Take 1 tablet by mouth every 6 (six) hours as needed for severe pain. 15 tablet 0   ibuprofen (IBU) 800 MG tablet Take 1 tablet (800 mg total) by mouth 3 (three) times daily as needed. 270 tablet 0   ipratropium (ATROVENT) 0.06 % nasal spray Place 2 sprays into both nostrils 3 (three) times daily as needed for rhinitis. Reported on 03/23/2015     Lancets Misc. MISC E11.9 Dispense type to match meter. 100 each 1   levocetirizine (XYZAL) 5 MG tablet Take 5 mg by mouth every morning.     loratadine (CLARITIN) 10 MG tablet Take 1  tablet (10 mg total) by mouth daily. 30 tablet 11   losartan-hydrochlorothiazide (HYZAAR) 100-12.5 MG tablet Take 1 tablet by mouth daily. 90 tablet 3   metaxalone (SKELAXIN) 800 MG tablet TAKE 1 TABLET BY MOUTH THREE TIMES A DAY (Patient taking differently: daily as needed.) 30 tablet 3   metformin (FORTAMET) 1000 MG (OSM) 24 hr tablet TAKE 2 TABLETS (2,000 MG TOTAL) AT BEDTIME 180 tablet 3   metFORMIN (GLUCOPHAGE) 500 MG tablet Take 500 mg by mouth 2 (two) times daily.     metFORMIN (GLUCOPHAGE-XR) 500 MG 24 hr tablet Take 2,000 mg by mouth at bedtime.     metoprolol succinate (TOPROL-XL) 100 MG 24 hr tablet Take 1.5 tablets (150 mg total) by mouth daily. Take with or immediately following a meal. 90 tablet 5   montelukast (SINGULAIR) 10 MG tablet Take 1 tablet (10 mg total) by mouth at bedtime. 90 tablet 3   MULTIPLE VITAMIN PO Take by mouth daily.     OZEMPIC, 0.25 OR 0.5 MG/DOSE, 2 MG/1.5ML SOPN Inject into the skin.     pantoprazole (PROTONIX) 40 MG tablet Take 1 tablet (40 mg total) by mouth every evening. 90 tablet 1   pravastatin (PRAVACHOL) 20 MG tablet TAKE 1 TABLET BY MOUTH EVERY DAY 90 tablet 0   predniSONE (STERAPRED UNI-PAK 21 TAB) 10 MG (21) TBPK tablet Take as directed 21 tablet 0   predniSONE (STERAPRED UNI-PAK 21 TAB) 10 MG (21) TBPK tablet Use as directed on packaging 1 each 0   PROAIR HFA 108 (90 Base) MCG/ACT inhaler INHALE 2 PUFFS INTO THE LUNGS EVERY 4 (FOUR) HOURS AS NEEDED FOR WHEEZING OR SHORTNESS OF BREATH. 8.5 Inhaler 1   sodium chloride (OCEAN) 0.65 % SOLN nasal spray Place 1 spray into both nostrils as needed for congestion. 1 Bottle 0   traMADol (ULTRAM) 50 MG tablet Take 1 tablet (50 mg total) by mouth every 8 (eight) hours as needed for severe pain. 60 tablet 0   vitamin B-12 (CYANOCOBALAMIN) 1000 MCG tablet Take 5,000 mcg by mouth daily.     Vitamin D, Ergocalciferol, (DRISDOL) 1.25 MG (50000 UT) CAPS capsule TAKE 1 CAPSULE (50,000 UNITS TOTAL) BY MOUTH 3 (THREE)  TIMES A WEEK. 36 capsule 2   No current facility-administered medications on file prior to visit.     Allergies:   Glimepiride, Morphine and codeine, Semaglutide(0.25 or 0.5mg -dos), Colcrys [colchicine], and Lisinopril   Social History   Socioeconomic History   Marital status: Single    Spouse name: Not on file   Number of children: Not on file   Years of education: Not on file   Highest education level: Not on file  Occupational History   Not on file  Tobacco Use  Smoking status: Former    Current packs/day: 0.00    Types: Cigarettes    Quit date: 11/20/2007    Years since quitting: 14.9   Smokeless tobacco: Never  Vaping Use   Vaping status: Never Used  Substance and Sexual Activity   Alcohol use: No    Comment: occasional   Drug use: No   Sexual activity: Never  Other Topics Concern   Not on file  Social History Narrative   Not on file   Social Determinants of Health   Financial Resource Strain: Not on file  Food Insecurity: Not on file  Transportation Needs: Not on file  Physical Activity: Not on file  Stress: Not on file  Social Connections: Not on file     Family History: The patient's family history includes Cancer (age of onset: 30) in her brother; Cancer (age of onset: 90) in her father; Diabetes in her father and paternal uncle; Heart disease in her mother; Stroke in her mother. Mother had MI late 6s  ROS:   Please see the history of present illness.     All other systems reviewed and are negative.  EKGs/Labs/Other Studies Reviewed:    The following studies were reviewed today:   EKG:  EKG is  ordered today.  The ekg ordered today demonstrates   NSR  09/19/2021- NSR   Recent Labs: No results found for requested labs within last 365 days.   Recent Lipid Panel    Component Value Date/Time   CHOL 130 04/23/2018 1422   TRIG 280 (H) 04/23/2018 1422   HDL 34 (L) 04/23/2018 1422   CHOLHDL 3.8 04/23/2018 1422   CHOLHDL 4.4 08/24/2015 0941    VLDL 39 (H) 08/24/2015 0941   LDLCALC 40 04/23/2018 1422   LDLDIRECT 101 04/20/2015 1536     Risk Assessment/Calculations:           Physical Exam:    VS:   Vitals:   11/05/22 0842  BP: 130/80  Pulse: 71  SpO2: 97%     Wt Readings from Last 3 Encounters:  09/19/21 225 lb (102.1 kg)  05/23/21 220 lb 9.6 oz (100.1 kg)  11/12/19 217 lb 9.5 oz (98.7 kg)     Physical Exam Gen: well appearing Neuro: alert and oriented CV: r,r,r no murmurs.  Pulm: CLAB Abd: non distended Ext: No LE edema Skin: warm and well perfused Psych: normal mood  ASSESSMENT:    SVT: Zio showed episodes of SVT. She was on metop 100mg  daily which was increased to 150 mg XL daily. Recommended cutting back on caffeine. No changes    HTN: well controlled. continue losartan/HCTz 100-12.5 mg   PLAN:    In order of problems listed above:  Follow up in on year         Medication Adjustments/Labs and Tests Ordered: Current medicines are reviewed at length with the patient today.  Concerns regarding medicines are outlined above.  No orders of the defined types were placed in this encounter.  No orders of the defined types were placed in this encounter.   There are no Patient Instructions on file for this visit.   Signed, Maisie Fus, MD  11/05/2022 7:47 AM    [facility]

## 2022-11-05 NOTE — Patient Instructions (Signed)
Follow-Up: At [facility], you and your health needs are our priority.  As part of our continuing mission to provide you with exceptional heart care, we have created designated Provider Care Teams.  These Care Teams include your primary Cardiologist (physician) and Advanced Practice Providers (APPs -  Physician Assistants and Nurse Practitioners) who all work together to provide you with the care you need, when you need it.  We recommend signing up for the patient portal called "MyChart".  Sign up information is provided on this After Visit Summary.  MyChart is used to connect with patients for Virtual Visits (Telemedicine).  Patients are able to view lab/test results, encounter notes, upcoming appointments, etc.  Non-urgent messages can be sent to your provider as well.   To learn more about what you can do with MyChart, go to [email or web address].    Your next appointment:   1 year(s)  The format for your next appointment:   In Person  Provider:   Maisie Fus, MD

## 2022-11-05 NOTE — Addendum Note (Signed)
Addended byCarolan Clines on: 11/05/2022 10:07 AM   Modules accepted: Level of Service

## 2022-11-19 DIAGNOSIS — J302 Other seasonal allergic rhinitis: Secondary | ICD-10-CM | POA: Diagnosis not present

## 2022-11-19 DIAGNOSIS — J4520 Mild intermittent asthma, uncomplicated: Secondary | ICD-10-CM | POA: Diagnosis not present

## 2022-11-19 DIAGNOSIS — E559 Vitamin D deficiency, unspecified: Secondary | ICD-10-CM | POA: Diagnosis not present

## 2022-11-19 DIAGNOSIS — E118 Type 2 diabetes mellitus with unspecified complications: Secondary | ICD-10-CM | POA: Diagnosis not present

## 2022-11-19 DIAGNOSIS — J42 Unspecified chronic bronchitis: Secondary | ICD-10-CM | POA: Diagnosis not present

## 2022-11-19 DIAGNOSIS — E7800 Pure hypercholesterolemia, unspecified: Secondary | ICD-10-CM | POA: Diagnosis not present

## 2022-11-19 DIAGNOSIS — G894 Chronic pain syndrome: Secondary | ICD-10-CM | POA: Diagnosis not present

## 2022-11-19 DIAGNOSIS — I10 Essential (primary) hypertension: Secondary | ICD-10-CM | POA: Diagnosis not present

## 2022-11-19 DIAGNOSIS — M8949 Other hypertrophic osteoarthropathy, multiple sites: Secondary | ICD-10-CM | POA: Diagnosis not present

## 2023-03-21 ENCOUNTER — Other Ambulatory Visit: Payer: Self-pay

## 2023-03-21 MED ORDER — METOPROLOL SUCCINATE ER 100 MG PO TB24: 150.0000 mg | ORAL_TABLET | Freq: Every day | ORAL | 2 refills | Status: AC

## 2023-03-24 DIAGNOSIS — E7800 Pure hypercholesterolemia, unspecified: Secondary | ICD-10-CM | POA: Diagnosis not present

## 2023-03-24 DIAGNOSIS — Z23 Encounter for immunization: Secondary | ICD-10-CM | POA: Diagnosis not present

## 2023-03-24 DIAGNOSIS — J42 Unspecified chronic bronchitis: Secondary | ICD-10-CM | POA: Diagnosis not present

## 2023-03-24 DIAGNOSIS — E118 Type 2 diabetes mellitus with unspecified complications: Secondary | ICD-10-CM | POA: Diagnosis not present

## 2023-03-24 DIAGNOSIS — J4520 Mild intermittent asthma, uncomplicated: Secondary | ICD-10-CM | POA: Diagnosis not present

## 2023-03-24 DIAGNOSIS — E890 Postprocedural hypothyroidism: Secondary | ICD-10-CM | POA: Diagnosis not present

## 2023-03-24 DIAGNOSIS — Z85820 Personal history of malignant melanoma of skin: Secondary | ICD-10-CM | POA: Diagnosis not present

## 2023-03-24 DIAGNOSIS — Z79899 Other long term (current) drug therapy: Secondary | ICD-10-CM | POA: Diagnosis not present

## 2023-03-24 DIAGNOSIS — E559 Vitamin D deficiency, unspecified: Secondary | ICD-10-CM | POA: Diagnosis not present

## 2023-03-24 DIAGNOSIS — G894 Chronic pain syndrome: Secondary | ICD-10-CM | POA: Diagnosis not present

## 2023-03-24 DIAGNOSIS — I10 Essential (primary) hypertension: Secondary | ICD-10-CM | POA: Diagnosis not present

## 2023-03-24 DIAGNOSIS — Z0000 Encounter for general adult medical examination without abnormal findings: Secondary | ICD-10-CM | POA: Diagnosis not present

## 2023-03-27 DIAGNOSIS — L821 Other seborrheic keratosis: Secondary | ICD-10-CM | POA: Diagnosis not present

## 2023-03-27 DIAGNOSIS — Z85820 Personal history of malignant melanoma of skin: Secondary | ICD-10-CM | POA: Diagnosis not present

## 2023-03-27 DIAGNOSIS — L308 Other specified dermatitis: Secondary | ICD-10-CM | POA: Diagnosis not present

## 2023-03-27 DIAGNOSIS — D225 Melanocytic nevi of trunk: Secondary | ICD-10-CM | POA: Diagnosis not present

## 2023-03-27 DIAGNOSIS — L814 Other melanin hyperpigmentation: Secondary | ICD-10-CM | POA: Diagnosis not present

## 2023-03-27 DIAGNOSIS — D485 Neoplasm of uncertain behavior of skin: Secondary | ICD-10-CM | POA: Diagnosis not present

## 2023-03-27 DIAGNOSIS — D1801 Hemangioma of skin and subcutaneous tissue: Secondary | ICD-10-CM | POA: Diagnosis not present

## 2023-03-27 DIAGNOSIS — L570 Actinic keratosis: Secondary | ICD-10-CM | POA: Diagnosis not present

## 2023-03-27 DIAGNOSIS — D2262 Melanocytic nevi of left upper limb, including shoulder: Secondary | ICD-10-CM | POA: Diagnosis not present

## 2023-03-27 DIAGNOSIS — D2272 Melanocytic nevi of left lower limb, including hip: Secondary | ICD-10-CM | POA: Diagnosis not present

## 2023-03-27 DIAGNOSIS — D2261 Melanocytic nevi of right upper limb, including shoulder: Secondary | ICD-10-CM | POA: Diagnosis not present

## 2023-03-27 DIAGNOSIS — D2271 Melanocytic nevi of right lower limb, including hip: Secondary | ICD-10-CM | POA: Diagnosis not present

## 2023-03-28 ENCOUNTER — Other Ambulatory Visit: Payer: Self-pay | Admitting: Family Medicine

## 2023-03-28 DIAGNOSIS — Z1231 Encounter for screening mammogram for malignant neoplasm of breast: Secondary | ICD-10-CM

## 2023-04-07 ENCOUNTER — Ambulatory Visit
Admission: RE | Admit: 2023-04-07 | Discharge: 2023-04-07 | Disposition: A | Discharge status: Home / Self Care | Payer: 59 | Source: Ambulatory Visit | Attending: Family Medicine | Admitting: Family Medicine

## 2023-04-07 DIAGNOSIS — Z1231 Encounter for screening mammogram for malignant neoplasm of breast: Secondary | ICD-10-CM

## 2023-05-07 DIAGNOSIS — N10 Acute tubulo-interstitial nephritis: Secondary | ICD-10-CM | POA: Diagnosis not present

## 2023-05-07 DIAGNOSIS — R319 Hematuria, unspecified: Secondary | ICD-10-CM | POA: Diagnosis not present

## 2023-05-07 DIAGNOSIS — R350 Frequency of micturition: Secondary | ICD-10-CM | POA: Diagnosis not present

## 2023-05-10 ENCOUNTER — Encounter (HOSPITAL_COMMUNITY): Payer: Self-pay | Admitting: Emergency Medicine

## 2023-05-10 ENCOUNTER — Emergency Department (HOSPITAL_COMMUNITY)

## 2023-05-10 ENCOUNTER — Inpatient Hospital Stay (HOSPITAL_COMMUNITY)
Admission: EM | Admit: 2023-05-10 | Discharge: 2023-05-13 | DRG: 690 | Disposition: A | Discharge status: Home / Self Care | Attending: Internal Medicine | Admitting: Internal Medicine

## 2023-05-10 ENCOUNTER — Emergency Department (HOSPITAL_COMMUNITY)
Admission: EM | Admit: 2023-05-10 | Discharge: 2023-05-10 | Disposition: A | Discharge status: Home / Self Care | Source: Home / Self Care

## 2023-05-10 ENCOUNTER — Other Ambulatory Visit: Payer: Self-pay

## 2023-05-10 DIAGNOSIS — I10 Essential (primary) hypertension: Secondary | ICD-10-CM | POA: Diagnosis present

## 2023-05-10 DIAGNOSIS — Z85828 Personal history of other malignant neoplasm of skin: Secondary | ICD-10-CM | POA: Diagnosis not present

## 2023-05-10 DIAGNOSIS — E876 Hypokalemia: Secondary | ICD-10-CM | POA: Diagnosis present

## 2023-05-10 DIAGNOSIS — Z87891 Personal history of nicotine dependence: Secondary | ICD-10-CM

## 2023-05-10 DIAGNOSIS — E785 Hyperlipidemia, unspecified: Secondary | ICD-10-CM | POA: Diagnosis present

## 2023-05-10 DIAGNOSIS — Z90710 Acquired absence of both cervix and uterus: Secondary | ICD-10-CM

## 2023-05-10 DIAGNOSIS — C541 Malignant neoplasm of endometrium: Secondary | ICD-10-CM | POA: Diagnosis present

## 2023-05-10 DIAGNOSIS — M1990 Unspecified osteoarthritis, unspecified site: Secondary | ICD-10-CM | POA: Diagnosis present

## 2023-05-10 DIAGNOSIS — J45909 Unspecified asthma, uncomplicated: Secondary | ICD-10-CM | POA: Diagnosis present

## 2023-05-10 DIAGNOSIS — Z8542 Personal history of malignant neoplasm of other parts of uterus: Secondary | ICD-10-CM | POA: Insufficient documentation

## 2023-05-10 DIAGNOSIS — K7460 Unspecified cirrhosis of liver: Secondary | ICD-10-CM | POA: Diagnosis present

## 2023-05-10 DIAGNOSIS — K760 Fatty (change of) liver, not elsewhere classified: Secondary | ICD-10-CM | POA: Diagnosis present

## 2023-05-10 DIAGNOSIS — R109 Unspecified abdominal pain: Secondary | ICD-10-CM | POA: Diagnosis present

## 2023-05-10 DIAGNOSIS — Z79899 Other long term (current) drug therapy: Secondary | ICD-10-CM | POA: Insufficient documentation

## 2023-05-10 DIAGNOSIS — E119 Type 2 diabetes mellitus without complications: Secondary | ICD-10-CM | POA: Insufficient documentation

## 2023-05-10 DIAGNOSIS — N12 Tubulo-interstitial nephritis, not specified as acute or chronic: Secondary | ICD-10-CM | POA: Insufficient documentation

## 2023-05-10 DIAGNOSIS — E890 Postprocedural hypothyroidism: Secondary | ICD-10-CM | POA: Diagnosis present

## 2023-05-10 DIAGNOSIS — E871 Hypo-osmolality and hyponatremia: Secondary | ICD-10-CM | POA: Diagnosis present

## 2023-05-10 DIAGNOSIS — Z823 Family history of stroke: Secondary | ICD-10-CM

## 2023-05-10 DIAGNOSIS — Z9089 Acquired absence of other organs: Secondary | ICD-10-CM | POA: Diagnosis not present

## 2023-05-10 DIAGNOSIS — Z9889 Other specified postprocedural states: Secondary | ICD-10-CM | POA: Diagnosis not present

## 2023-05-10 DIAGNOSIS — K5730 Diverticulosis of large intestine without perforation or abscess without bleeding: Secondary | ICD-10-CM | POA: Diagnosis not present

## 2023-05-10 DIAGNOSIS — R739 Hyperglycemia, unspecified: Secondary | ICD-10-CM | POA: Diagnosis not present

## 2023-05-10 DIAGNOSIS — K219 Gastro-esophageal reflux disease without esophagitis: Secondary | ICD-10-CM | POA: Diagnosis present

## 2023-05-10 DIAGNOSIS — Z85820 Personal history of malignant melanoma of skin: Secondary | ICD-10-CM | POA: Diagnosis not present

## 2023-05-10 DIAGNOSIS — Z7985 Long-term (current) use of injectable non-insulin antidiabetic drugs: Secondary | ICD-10-CM

## 2023-05-10 DIAGNOSIS — Z888 Allergy status to other drugs, medicaments and biological substances status: Secondary | ICD-10-CM

## 2023-05-10 DIAGNOSIS — Z806 Family history of leukemia: Secondary | ICD-10-CM

## 2023-05-10 DIAGNOSIS — R1111 Vomiting without nausea: Secondary | ICD-10-CM | POA: Diagnosis not present

## 2023-05-10 DIAGNOSIS — N200 Calculus of kidney: Secondary | ICD-10-CM | POA: Diagnosis not present

## 2023-05-10 DIAGNOSIS — Z87442 Personal history of urinary calculi: Secondary | ICD-10-CM

## 2023-05-10 DIAGNOSIS — R319 Hematuria, unspecified: Secondary | ICD-10-CM | POA: Diagnosis present

## 2023-05-10 DIAGNOSIS — Z6839 Body mass index (BMI) 39.0-39.9, adult: Secondary | ICD-10-CM | POA: Diagnosis not present

## 2023-05-10 DIAGNOSIS — Z833 Family history of diabetes mellitus: Secondary | ICD-10-CM | POA: Diagnosis not present

## 2023-05-10 DIAGNOSIS — Z9049 Acquired absence of other specified parts of digestive tract: Secondary | ICD-10-CM | POA: Diagnosis not present

## 2023-05-10 DIAGNOSIS — I471 Supraventricular tachycardia, unspecified: Secondary | ICD-10-CM | POA: Diagnosis present

## 2023-05-10 DIAGNOSIS — Z8249 Family history of ischemic heart disease and other diseases of the circulatory system: Secondary | ICD-10-CM

## 2023-05-10 DIAGNOSIS — E1165 Type 2 diabetes mellitus with hyperglycemia: Secondary | ICD-10-CM | POA: Diagnosis present

## 2023-05-10 DIAGNOSIS — Z7984 Long term (current) use of oral hypoglycemic drugs: Secondary | ICD-10-CM

## 2023-05-10 DIAGNOSIS — G894 Chronic pain syndrome: Secondary | ICD-10-CM | POA: Diagnosis present

## 2023-05-10 DIAGNOSIS — Z885 Allergy status to narcotic agent status: Secondary | ICD-10-CM

## 2023-05-10 DIAGNOSIS — R10817 Generalized abdominal tenderness: Secondary | ICD-10-CM | POA: Diagnosis not present

## 2023-05-10 DIAGNOSIS — N1330 Unspecified hydronephrosis: Secondary | ICD-10-CM | POA: Diagnosis not present

## 2023-05-10 DIAGNOSIS — R110 Nausea: Secondary | ICD-10-CM | POA: Diagnosis not present

## 2023-05-10 DIAGNOSIS — R0600 Dyspnea, unspecified: Secondary | ICD-10-CM | POA: Diagnosis not present

## 2023-05-10 LAB — COMPREHENSIVE METABOLIC PANEL WITH GFR
ALT: 31 U/L (ref 0–44)
AST: 31 U/L (ref 15–41)
Albumin: 4.4 g/dL (ref 3.5–5.0)
Alkaline Phosphatase: 71 U/L (ref 38–126)
Anion gap: 13 (ref 5–15)
BUN: 23 mg/dL (ref 8–23)
CO2: 26 mmol/L (ref 22–32)
Calcium: 9.6 mg/dL (ref 8.9–10.3)
Chloride: 96 mmol/L — ABNORMAL LOW (ref 98–111)
Creatinine, Ser: 0.95 mg/dL (ref 0.44–1.00)
GFR, Estimated: >60 mL/min (ref >60)
Glucose, Bld: 335 mg/dL — ABNORMAL HIGH (ref 70–99)
Potassium: 3.5 mmol/L (ref 3.5–5.1)
Sodium: 135 mmol/L (ref 135–145)
Total Bilirubin: 0.8 mg/dL (ref 0.0–1.2)
Total Protein: 8.6 g/dL — ABNORMAL HIGH (ref 6.5–8.1)

## 2023-05-10 LAB — CBC
HCT: 48 % — ABNORMAL HIGH (ref 36.0–46.0)
Hemoglobin: 15.4 g/dL — ABNORMAL HIGH (ref 12.0–15.0)
MCH: 28.6 pg (ref 26.0–34.0)
MCHC: 32.1 g/dL (ref 30.0–36.0)
MCV: 89.2 fL (ref 80.0–100.0)
Platelets: 375 10*3/uL (ref 150–400)
RBC: 5.38 MIL/uL — ABNORMAL HIGH (ref 3.87–5.11)
RDW: 13.2 % (ref 11.5–15.5)
WBC: 15.6 10*3/uL — ABNORMAL HIGH (ref 4.0–10.5)
nRBC: 0 % (ref 0.0–0.2)

## 2023-05-10 LAB — URINALYSIS, ROUTINE W REFLEX MICROSCOPIC
Bacteria, UA: NONE SEEN
Bacteria, UA: NONE SEEN
Bilirubin Urine: NEGATIVE
Bilirubin Urine: NEGATIVE
Glucose, UA: >=500 mg/dL — AB
Glucose, UA: >=500 mg/dL — AB
Ketones, ur: 20 mg/dL — AB
Ketones, ur: 20 mg/dL — AB
Leukocytes,Ua: NEGATIVE
Leukocytes,Ua: NEGATIVE
Nitrite: POSITIVE — AB
Nitrite: POSITIVE — AB
Protein, ur: 100 mg/dL — AB
Protein, ur: 100 mg/dL — AB
RBC / HPF: >50 RBC/hpf (ref 0–5)
RBC / HPF: >50 RBC/hpf (ref 0–5)
Specific Gravity, Urine: 1.031 — ABNORMAL HIGH (ref 1.005–1.030)
Specific Gravity, Urine: 1.033 — ABNORMAL HIGH (ref 1.005–1.030)
WBC, UA: >50 WBC/hpf (ref 0–5)
pH: 6 (ref 5.0–8.0)
pH: 5 (ref 5.0–8.0)

## 2023-05-10 LAB — CBC WITH DIFFERENTIAL/PLATELET
Abs Immature Granulocytes: 0.17 10*3/uL — ABNORMAL HIGH (ref 0.00–0.07)
Basophils Absolute: 0.1 10*3/uL (ref 0.0–0.1)
Basophils Relative: 0 %
Eosinophils Absolute: 0 10*3/uL (ref 0.0–0.5)
Eosinophils Relative: 0 %
HCT: 43.7 % (ref 36.0–46.0)
Hemoglobin: 14.2 g/dL (ref 12.0–15.0)
Immature Granulocytes: 1 %
Lymphocytes Relative: 9 %
Lymphs Abs: 1.4 10*3/uL (ref 0.7–4.0)
MCH: 28.4 pg (ref 26.0–34.0)
MCHC: 32.5 g/dL (ref 30.0–36.0)
MCV: 87.4 fL (ref 80.0–100.0)
Monocytes Absolute: 0.8 10*3/uL (ref 0.1–1.0)
Monocytes Relative: 5 %
Neutro Abs: 13 10*3/uL — ABNORMAL HIGH (ref 1.7–7.7)
Neutrophils Relative %: 85 %
Platelets: 336 10*3/uL (ref 150–400)
RBC: 5 MIL/uL (ref 3.87–5.11)
RDW: 13 % (ref 11.5–15.5)
WBC: 15.4 10*3/uL — ABNORMAL HIGH (ref 4.0–10.5)
nRBC: 0 % (ref 0.0–0.2)

## 2023-05-10 LAB — BASIC METABOLIC PANEL WITH GFR
Anion gap: 15 (ref 5–15)
BUN: 23 mg/dL (ref 8–23)
CO2: 20 mmol/L — ABNORMAL LOW (ref 22–32)
Calcium: 8.6 mg/dL — ABNORMAL LOW (ref 8.9–10.3)
Chloride: 97 mmol/L — ABNORMAL LOW (ref 98–111)
Creatinine, Ser: 0.84 mg/dL (ref 0.44–1.00)
GFR, Estimated: >60 mL/min (ref >60)
Glucose, Bld: 272 mg/dL — ABNORMAL HIGH (ref 70–99)
Potassium: 3.2 mmol/L — ABNORMAL LOW (ref 3.5–5.1)
Sodium: 132 mmol/L — ABNORMAL LOW (ref 135–145)

## 2023-05-10 MED ORDER — SODIUM CHLORIDE 0.9 % IV SOLN
1.0000 g | INTRAVENOUS | Status: DC
  Administered 2023-05-11 – 2023-05-13 (×3): 1 g via INTRAVENOUS
  Filled 2023-05-10 (×3): qty 10

## 2023-05-10 MED ORDER — POTASSIUM CHLORIDE 10 MEQ/100ML IV SOLN
10.0000 meq | INTRAVENOUS | Status: AC
  Administered 2023-05-11 (×4): 10 meq via INTRAVENOUS
  Filled 2023-05-10 (×4): qty 100

## 2023-05-10 MED ORDER — KETOROLAC TROMETHAMINE 15 MG/ML IJ SOLN
15.0000 mg | Freq: Once | INTRAMUSCULAR | Status: AC
  Administered 2023-05-10: 15 mg via INTRAVENOUS
  Filled 2023-05-10: qty 1

## 2023-05-10 MED ORDER — ONDANSETRON 4 MG PO TBDP: 4.0000 mg | ORAL_TABLET | Freq: Three times a day (TID) | ORAL | 0 refills | Status: AC | PRN

## 2023-05-10 MED ORDER — ONDANSETRON 4 MG PO TBDP: 4.0000 mg | ORAL_TABLET | Freq: Three times a day (TID) | ORAL | 0 refills | Status: DC | PRN

## 2023-05-10 MED ORDER — CEPHALEXIN 500 MG PO CAPS: 500.0000 mg | ORAL_CAPSULE | Freq: Four times a day (QID) | ORAL | 0 refills | Status: AC

## 2023-05-10 MED ORDER — FENTANYL CITRATE PF 50 MCG/ML IJ SOSY: 75.0000 ug | PREFILLED_SYRINGE | Freq: Once | INTRAMUSCULAR | Status: DC

## 2023-05-10 MED ORDER — SODIUM CHLORIDE 0.9 % IV BOLUS
1000.0000 mL | Freq: Once | INTRAVENOUS | Status: AC
  Administered 2023-05-11: 1000 mL via INTRAVENOUS

## 2023-05-10 MED ORDER — ONDANSETRON HCL 4 MG/2ML IJ SOLN
4.0000 mg | Freq: Once | INTRAMUSCULAR | Status: AC
  Administered 2023-05-10: 4 mg via INTRAVENOUS
  Filled 2023-05-10: qty 2

## 2023-05-10 MED ORDER — SODIUM CHLORIDE 0.9 % IV SOLN
2.0000 g | Freq: Once | INTRAVENOUS | Status: AC
  Administered 2023-05-10: 2 g via INTRAVENOUS
  Filled 2023-05-10: qty 20

## 2023-05-10 MED ORDER — INSULIN ASPART 100 UNIT/ML IJ SOLN
0.0000 [IU] | INTRAMUSCULAR | Status: DC
  Administered 2023-05-11: 2 [IU] via SUBCUTANEOUS
  Administered 2023-05-11: 1 [IU] via SUBCUTANEOUS
  Administered 2023-05-11: 5 [IU] via SUBCUTANEOUS
  Administered 2023-05-11 – 2023-05-12 (×4): 2 [IU] via SUBCUTANEOUS
  Administered 2023-05-12: 1 [IU] via SUBCUTANEOUS
  Administered 2023-05-12 – 2023-05-13 (×7): 2 [IU] via SUBCUTANEOUS
  Filled 2023-05-10: qty 0.09

## 2023-05-10 MED ORDER — CEPHALEXIN 500 MG PO CAPS: 500.0000 mg | ORAL_CAPSULE | Freq: Four times a day (QID) | ORAL | 0 refills | Status: DC

## 2023-05-10 MED ORDER — LACTATED RINGERS IV BOLUS
1000.0000 mL | Freq: Once | INTRAVENOUS | Status: AC
  Administered 2023-05-10: 1000 mL via INTRAVENOUS

## 2023-05-10 MED ORDER — HYDROMORPHONE HCL 1 MG/ML IJ SOLN
0.5000 mg | Freq: Once | INTRAMUSCULAR | Status: AC
  Administered 2023-05-10: 0.5 mg via INTRAVENOUS
  Filled 2023-05-10: qty 1

## 2023-05-10 NOTE — Subjective & Objective (Signed)
 Patient was seen earlier today complaining of right-sided flank pain she has known history of kidney stones has significant nausea Imaging done showed evidence pyelonephritis no obstructing stone patient was given a dose of Rocephin and discharged to home unfortunate patient was no longer able to tolerate p.o.'s and presented back to emergency department

## 2023-05-10 NOTE — ED Triage Notes (Signed)
 Pt BIB [facility] from home. Pt c/o right sided flank pain since Tuesday. Pt had future urology appointment for chronic kidney stones. Pt reports having unbearable pain and not being able to keep down oral pain meds due to nausea.  160 palpated 70HR 9% RA 344 CBG Ems administered 4mg  zofran

## 2023-05-10 NOTE — ED Provider Notes (Signed)
 [place] [facility]MENT AT [facility] Provider Note   CSN: [id number] Arrival date & time: 05/10/23  1942     History  Chief Complaint  Patient presents with   Abdominal Pain   Dysuria    Penney Domanski is a 68 y.o. female.  HPI   68 year old female presents emergency department with worsening suprapubic discomfort, difficulty with urination, nausea/vomiting.  Patient was seen earlier today, diagnosed with pyelonephritis and discharged on oral mood/antibiotics.  She returns with her symptoms worsening throughout the day.  She endorses severe chills and vomiting, unable to tolerate her antibiotic.  Denies any new symptoms acute symptoms including chest pain, shortness of breath.  Home Medications Prior to Admission medications   Medication Sig Start Date End Date Taking? Authorizing Provider  blood glucose meter kit and supplies KIT Dispense based on patient and insurance preference. Use up to four times daily as directed. (FOR ICD-9 250.00, 250.01). 03/17/18   Cranston Dk, MD  BYDUREON BCISE 2 MG/0.85ML AUIJ INJECT 2 MG INTO THE SKIN ONCE A WEEK. 07/03/18   Stallings, Zoe A, MD  cephALEXin (KEFLEX) 500 MG capsule Take 1 capsule (500 mg total) by mouth 4 (four) times daily for 10 days. 05/10/23 05/20/23  Keith, Kayla N, PA-C  ciprofloxacin (CIPRO) 500 MG tablet Take 1 tablet (500 mg total) by mouth 2 (two) times daily. 11/12/19   Margarete Sharps, Margaux, PA-C  cyclobenzaprine (FLEXERIL) 10 MG tablet Take 1 tablet (10 mg total) by mouth at bedtime. 10/26/17   Cranston Dk, MD  famotidine (PEPCID) 20 MG tablet Take 1 tablet (20 mg total) by mouth 2 (two) times daily as needed for heartburn or indigestion. 02/11/18   Cranston Dk, MD  FARXIGA 5 MG TABS tablet Take 5 mg by mouth daily. 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 04/21/18   Sagardia, Isidro Margo, MD  glipiZIDE  (GLUCOTROL XL) 10 MG 24 hr tablet Take 10 mg by mouth every morning. 04/25/21   [provider]  glucose blood test strip E11.9. Dispense type to match meter. Use bid to check sugars. Use as instructed 03/30/18   Cranston Dk, MD  HYDROcodone-acetaminophen (NORCO/VICODIN) 5-325 MG tablet Take 1 tablet by mouth every 6 (six) hours as needed for severe pain. 11/12/19   Venter, Margaux, PA-C  ipratropium (ATROVENT) 0.06 % nasal spray Place 2 sprays into both nostrils 3 (three) times daily as needed for rhinitis. Reported on 03/23/2015    [provider]  Lancets Misc. MISC E11.9 Dispense type to match meter. 03/30/18   Cranston Dk, MD  levocetirizine (XYZAL) 5 MG tablet Take 5 mg by mouth every morning. 07/18/19   [provider]  loratadine (CLARITIN) 10 MG tablet Take 1 tablet (10 mg total) by mouth daily. 02/11/18   Cranston Dk, MD  losartan-hydrochlorothiazide (HYZAAR) 100-12.5 MG tablet Take 1 tablet by mouth daily. 10/25/17   Cranston Dk, MD  metformin (FORTAMET) 1000 MG (OSM) 24 hr tablet TAKE 2 TABLETS (2,000 MG TOTAL) AT BEDTIME 02/11/18   Cranston Dk, MD  metFORMIN (GLUCOPHAGE) 500 MG tablet Take 500 mg by mouth 2 (two) times daily.    [provider]  metoprolol succinate (TOPROL-XL) 100 MG 24 hr tablet Take 1.5 tablets (150 mg total) by mouth daily. Take with or immediately following a meal. 03/21/23   Bridgette Campus,  MD  montelukast (SINGULAIR) 10 MG tablet Take 1 tablet (10 mg total) by mouth at bedtime. 02/11/18   Cranston Dk, MD  MULTIPLE VITAMIN PO Take by mouth daily.    [provider]  ondansetron (ZOFRAN-ODT) 4 MG disintegrating tablet Take 1 tablet (4 mg total) by mouth every 8 (eight) hours as needed for nausea or vomiting. 05/10/23   Keith, Kayla N, PA-C  pantoprazole (PROTONIX) 40 MG tablet Take 1 tablet (40 mg total) by mouth every evening. 02/11/18   Cranston Dk, MD  pravastatin (PRAVACHOL) 20 MG tablet TAKE 1 TABLET BY MOUTH EVERY DAY 07/08/18    Elyce Hams, Marguerita Shih, MD  PROAIR HFA 108 [phone] Base) MCG/ACT inhaler INHALE 2 PUFFS INTO THE LUNGS EVERY 4 (FOUR) HOURS AS NEEDED FOR WHEEZING OR SHORTNESS OF BREATH. 05/13/18   Cranston Dk, MD  sodium chloride (OCEAN) 0.65 % SOLN nasal spray Place 1 spray into both nostrils as needed for congestion. 07/13/16   Cranston Dk, MD  traMADol (ULTRAM) 50 MG tablet Take 1 tablet (50 mg total) by mouth every 8 (eight) hours as needed for severe pain. 04/21/18   Elvira Hammersmith, MD  vitamin B-12 (CYANOCOBALAMIN) 1000 MCG tablet Take 5,000 mcg by mouth daily.    [provider]  Vitamin D, Ergocalciferol, (DRISDOL) 1.25 MG (50000 UT) CAPS capsule TAKE 1 CAPSULE (50,000 UNITS TOTAL) BY MOUTH 3 (THREE) TIMES A WEEK. 01/12/18   Cranston Dk, MD      Allergies    Glimepiride, Morphine and codeine, Semaglutide(0.25 or 0.5mg -dos), Colcrys [colchicine], and Lisinopril    Review of Systems   Review of Systems  Constitutional:  Positive for appetite change and chills. Negative for fever.  Respiratory:  Negative for shortness of breath.   Cardiovascular:  Negative for chest pain.  Gastrointestinal:  Positive for nausea and vomiting. Negative for abdominal pain and diarrhea.  Genitourinary:  Positive for difficulty urinating, dysuria, flank pain and frequency.  Skin:  Negative for rash.  Neurological:  Negative for headaches.    Physical Exam Updated Vital Signs BP (!) 147/82 (BP Location: Left Arm)   Pulse 75   Temp 97.9 F (36.6 C) (Oral)   Resp 18   SpO2 94%  Physical Exam Vitals and nursing note reviewed.  Constitutional:      Appearance: Normal appearance. She is ill-appearing.  HENT:     Head: Normocephalic.     Mouth/Throat:     Mouth: Mucous membranes are moist.  Cardiovascular:     Rate and Rhythm: Normal rate.  Pulmonary:     Effort: Pulmonary effort is normal. No respiratory distress.  Abdominal:     General: Bowel sounds are normal. There is no distension.     Palpations:  Abdomen is soft.     Tenderness: There is abdominal tenderness in the suprapubic area. There is no guarding.  Skin:    General: Skin is warm.  Neurological:     Mental Status: She is alert and oriented to person, place, and time. Mental status is at baseline.  Psychiatric:        Mood and Affect: Mood normal.     ED Results / Procedures / Treatments   Labs (all labs ordered are listed, but only abnormal results are displayed) Labs Reviewed  URINE CULTURE  CBC WITH DIFFERENTIAL/PLATELET  BASIC METABOLIC PANEL WITH GFR  URINALYSIS, ROUTINE W REFLEX MICROSCOPIC    EKG None  Radiology CT RENAL STONE STUDY Result Date: 05/10/2023  CLINICAL DATA:  Abdominal pain with cysts stone suspected EXAM: CT ABDOMEN AND PELVIS WITHOUT CONTRAST TECHNIQUE: Multidetector CT imaging of the abdomen and pelvis was performed following the standard protocol without IV contrast. RADIATION DOSE REDUCTION: This exam was performed according to the departmental dose-optimization program which includes automated exposure control, adjustment of the mA and/or kV according to patient size and/or use of iterative reconstruction technique. COMPARISON:  11/12/2019 FINDINGS: Lower chest:  No contributory findings. Hepatobiliary: Hepatic steatosis. The caudate lobe and fissures are prominent in size with borderline surface lobulation.Absent gallbladder. No biliary dilatation. Pancreas: Generalized fatty infiltration. Spleen: Unremarkable. Adrenals/Urinary Tract: Negative adrenals. Mild right hydronephrosis and low-density renal expansion with perinephric stranding. No ureteral calculus or other obstructing process seen currently. Unremarkable bladder for the degree of distension. Stomach/Bowel: No obstruction. No visible bowel inflammation. Numerous colonic diverticula. Vascular/Lymphatic: No acute vascular abnormality. Mild scattered atheromatous calcification of the aorta. No mass or adenopathy. Reproductive:Hysterectomy Other:  No ascites or pneumoperitoneum. Musculoskeletal: No acute abnormalities.  Generalized degeneration. IMPRESSION: Right hydronephrosis and perinephric stranding but no underlying calculus or other visible obstructive process. Hepatic steatosis and potential for cirrhosis. Extensive colonic diverticulosis. Electronically Signed   By: Ronnette Coke M.D.   On: 05/10/2023 06:27    Procedures Procedures    Medications Ordered in ED Medications  ketorolac (TORADOL) 15 MG/ML injection 15 mg (has no administration in time range)    ED Course/ Medical Decision Making/ A&P                                 Medical Decision Making Amount and/or Complexity of Data Reviewed Labs: ordered.  Risk Prescription drug management.   68 year old female presents to the emergency department with ongoing dysuria, suprapubic pain, nausea/vomiting.  Was seen earlier today, diagnosed with pyelonephritis.  CT at that time showed mild right hydro with no obstructive process.  She was feeling better and tried to go home with oral medication.  She returns being unable to tolerate p.o.  Patient complains of the ongoing urinary symptoms.  Repeat blood work here confirms ongoing leukocytosis, she has mild hyponatremia and hypokalemia blood work which is changed from this morning.  Urinalysis shows ongoing UTI.  She already received her first dose of Rocephin earlier this morning but otherwise was not able to tolerate any oral nausea medicine.  After medication patient feels improved, still complaining of ongoing pain and dehydration.  Not able to fully tolerate liquids/oral meds.  For this reason should be admitted for IV treatment of pyelonephritis.  Patients evaluation and results requires admission for further treatment and care.  Spoke with hospitalist, reviewed patient's ED course and they accept admission.  Patient agrees with admission plan, offers no new complaints and is stable/unchanged at time of  admit.        Final Clinical Impression(s) / ED Diagnoses Final diagnoses:  None    Rx / DC Orders ED Discharge Orders     None         Flonnie Humphrey, DO 05/10/23 2305

## 2023-05-10 NOTE — H&P (Signed)
 Donna [name] ZOX:[id number] DOB: [birth year] DOA: 05/10/2023     PCP: Dorena Gander, MD   Outpatient Specialists:   CARDS:   Dr. Bridgette Campus, MD   Urology    Patient arrived to ER on 05/10/23 at 1942 Referred by Attending Horton, Sidra Dredge, DO   Patient coming from:    home Lives alone,       Chief Complaint:   Chief Complaint  Patient presents with   Abdominal Pain   Dysuria    HPI: Donna [name] is a 68 y.o. female with medical history significant of kidney stones, HTN, DM2, chronic pain, endometrial cancer status post total hysterectomy gout palpitations, GERD HTN     Presented with nausea vomiting dysuria Patient was seen earlier today complaining of right-sided flank pain she has known history of kidney stones has significant nausea Imaging done showed evidence pyelonephritis no obstructing stone patient was given a dose of Rocephin and discharged to home unfortunate patient was no longer able to tolerate p.o.'s and presented back to emergency department    Reports severe right flank pain   Says [facility] does not take her medicaid   Denies significant ETOH intake   Does not smoke  Smokes some Marijuana  No results found for: "SARSCOV2NAA"      Regarding pertinent Chronic problems:     Hyperlipidemia  on statins Pravachol (pravastatin)  Lipid Panel     Component Value Date/Time   CHOL 130 04/23/2018 1422   TRIG 280 (H) 04/23/2018 1422   HDL 34 (L) 04/23/2018 1422   CHOLHDL 3.8 04/23/2018 1422   CHOLHDL 4.4 08/24/2015 0941   VLDL 39 (H) 08/24/2015 0941   LDLCALC 40 04/23/2018 1422   LDLDIRECT 101 04/20/2015 1536   LABVLDL 56 (H) 04/23/2018 1422     HTN on losartan/hydrochlorothiazide, Toprol      DM 2 - Farxiga Glucotrol metformin Lab Results  Component Value Date   HGBA1C 7.4 (H) 04/23/2018   on PO meds      Morbid obesity-   BMI Readings from Last 1 Encounters:  11/05/22 40.41 kg/m       Asthma? -well   controlled  on home inhalers/ nebs                      Cancer: Endometrial cancer status post uterine resection    While in ER:   Nausea vomiting significant given Zofran admitted for inability to tolerate p.o. antibiotics     Lab Orders         Urine Culture         CBC with Differential         Basic metabolic panel         Urinalysis, Routine w reflex microscopic -Urine, Clean Catch      CTabd/pelvis - Right hydronephrosis and perinephric stranding but no underlying calculus or other visible obstructive process.   Hepatic steatosis and potential for cirrhosis.   Extensive colonic diverticulosis.    Following Medications were ordered in ER: Medications  sodium chloride 0.9 % bolus 1,000 mL (has no administration in time range)  ketorolac (TORADOL) 15 MG/ML injection 15 mg (15 mg Intravenous Given 05/10/23 2139)  ondansetron (ZOFRAN) injection 4 mg (4 mg Intravenous Given 05/10/23 2202)       ED Triage Vitals  Encounter Vitals Group     BP 05/10/23 1958 (!) 147/82     Systolic BP Percentile --  Diastolic BP Percentile --      Pulse Rate 05/10/23 1958 75     Resp 05/10/23 1958 18     Temp 05/10/23 1958 97.9 F (36.6 C)     Temp Source 05/10/23 1958 Oral     SpO2 05/10/23 1958 94 %     Weight --      Height --      Head Circumference --      Peak Flow --      Pain Score 05/10/23 1952 10     Pain Loc --      Pain Education --      Exclude from Growth Chart --   AOZH(08)@     _________________________________________ Significant initial  Findings: Abnormal Labs Reviewed  CBC WITH DIFFERENTIAL/PLATELET - Abnormal; Notable for the following components:      Result Value   WBC 15.4 (*)    Neutro Abs 13.0 (*)    Abs Immature Granulocytes 0.17 (*)    All other components within normal limits  BASIC METABOLIC PANEL WITH GFR - Abnormal; Notable for the following components:   Sodium 132 (*)    Potassium 3.2 (*)    Chloride 97 (*)    CO2 20 (*)    Glucose, Bld 272 (*)     Calcium 8.6 (*)    All other components within normal limits  URINALYSIS, ROUTINE W REFLEX MICROSCOPIC - Abnormal; Notable for the following components:   Color, Urine AMBER (*)    Specific Gravity, Urine 1.033 (*)    Glucose, UA >=500 (*)    Hgb urine dipstick MODERATE (*)    Ketones, ur 20 (*)    Protein, ur 100 (*)    Nitrite POSITIVE (*)    All other components within normal limits       ECG: Ordered Personally reviewed and interpreted by me showing: HR : 85 RhythmSinus rhythm Anteroseptal infarct, age indeterminate QTC 60     The recent clinical data is shown below. Vitals:   05/10/23 2145 05/10/23 2200 05/10/23 2205 05/10/23 2304  BP:    132/62  Pulse: 73 76 76 89  Resp:    16  Temp:      TempSrc:      SpO2: 92% (!) 89% 91% 92%    WBC     Component Value Date/Time   WBC 15.4 (H) 05/10/2023 2144   LYMPHSABS 1.4 05/10/2023 2144   LYMPHSABS 3.0 10/17/2016 0945   MONOABS 0.8 05/10/2023 2144   EOSABS 0.0 05/10/2023 2144   EOSABS 0.4 10/17/2016 0945   BASOSABS 0.1 05/10/2023 2144   BASOSABS 0.1 10/17/2016 0945     Lactic Acid, Venous ordered   Procalcitonin   Ordered      UA   evidence of UTI      Urine analysis:    Component Value Date/Time   COLORURINE AMBER (A) 05/10/2023 2144   APPEARANCEUR CLEAR 05/10/2023 2144   LABSPEC 1.033 (H) 05/10/2023 2144   PHURINE 5.0 05/10/2023 2144   GLUCOSEU >=500 (A) 05/10/2023 2144   HGBUR MODERATE (A) 05/10/2023 2144   BILIRUBINUR NEGATIVE 05/10/2023 2144   BILIRUBINUR negative 10/17/2016 0946   KETONESUR 20 (A) 05/10/2023 2144   PROTEINUR 100 (A) 05/10/2023 2144   UROBILINOGEN 0.2 10/17/2016 0946   UROBILINOGEN 0.2 11/19/2013 1414   NITRITE POSITIVE (A) 05/10/2023 2144   LEUKOCYTESUR NEGATIVE 05/10/2023 2144    Results for orders placed or performed during the hospital encounter of 11/12/19  Urine culture  Status: Abnormal   Collection Time: 11/12/19 11:37 AM   Specimen: Urine, Random  Result Value  Ref Range Status   Specimen Description   Final    URINE, RANDOM Performed at [facility], [address] [place], [place] [address]    Special Requests   Final    NONE Performed at [facility], [address] [place], [place] [address]    Culture (A)  Final    <10,000 COLONIES/mL INSIGNIFICANT GROWTH Performed at [facility], [address] [place], [place] [address]    Report Status 11/13/2019 FINAL  Final    ABX started rocephin Antibiotics Given (last 72 hours)     None         __________________________________________________________ Recent Labs  Lab 05/10/23 0523 05/10/23 2144  NA 135 132*  K 3.5 3.2*  CO2 26 20*  GLUCOSE 335* 272*  BUN 23 23  CREATININE 0.95 0.84  CALCIUM 9.6 8.6*    Cr   stable,    Lab Results  Component Value Date   CREATININE 0.84 05/10/2023   CREATININE 0.95 05/10/2023   CREATININE 0.78 11/12/2019    Recent Labs  Lab 05/10/23 0523  AST 31  ALT 31  ALKPHOS 71  BILITOT 0.8  PROT 8.6*  ALBUMIN 4.4   Lab Results  Component Value Date   CALCIUM 8.6 (L) 05/10/2023   PHOS 3.1 09/03/2013    Plt: Lab Results  Component Value Date   PLT 336 05/10/2023       Recent Labs  Lab 05/10/23 0523 05/10/23 2144  WBC 15.6* 15.4*  NEUTROABS  --  13.0*  HGB 15.4* 14.2  HCT 48.0* 43.7  MCV 89.2 87.4  PLT 375 336    HG/HCT   stable,      Component Value Date/Time   HGB 14.2 05/10/2023 2144   HGB 12.8 12/18/2017 1551   HCT 43.7 05/10/2023 2144   HCT 38.9 12/18/2017 1551   MCV 87.4 05/10/2023 2144   MCV 88 12/18/2017 1551     _______________________________________________ Hospitalist was called for admission for   Pyelonephritis     The following Work up has been ordered so far:  Orders Placed This Encounter  Procedures   Urine Culture   CBC with Differential   Basic metabolic panel   Urinalysis, Routine w reflex microscopic -Urine, Clean Catch   In and Out Cath   Consult to  hospitalist     DM  labs:  HbA1C: No results for input(s): "HGBA1C" in the last 8760 hours.     CBG (last 3)  No results for input(s): "GLUCAP" in the last 72 hours.        Cultures:    Component Value Date/Time   SDES  11/12/2019 1137    URINE, RANDOM Performed at [facility], [address] [place], [place] [address]    [facility]  11/12/2019 1137    NONE Performed at [facility], [address] [place], [place] [address]    CULT (A) 11/12/2019 1137    <10,000 COLONIES/mL INSIGNIFICANT GROWTH Performed at [facility], [address] [place], [place] [address]    REPTSTATUS 11/13/2019 FINAL 11/12/2019 1137     Radiological Exams on Admission: DG CHEST PORT 1 VIEW Result Date: 05/11/2023 CLINICAL DATA:  [id number] Dyspnea [id number] EXAM: PORTABLE CHEST 1 VIEW COMPARISON:  Chest x-ray 11/19/2013 FINDINGS: The heart and mediastinal contours are within normal  limits. No focal consolidation. No pulmonary edema. No pleural effusion. No pneumothorax. No acute osseous abnormality. Multiple vascular clips overlie bilateral neck. IMPRESSION: No active disease. Electronically Signed   By: Morgane  Naveau M.D.   On: 05/11/2023 00:39   CT RENAL STONE STUDY Result Date: 05/10/2023 CLINICAL DATA:  Abdominal pain with cysts stone suspected EXAM: CT ABDOMEN AND PELVIS WITHOUT CONTRAST TECHNIQUE: Multidetector CT imaging of the abdomen and pelvis was performed following the standard protocol without IV contrast. RADIATION DOSE REDUCTION: This exam was performed according to the departmental dose-optimization program which includes automated exposure control, adjustment of the mA and/or kV according to patient size and/or use of iterative reconstruction technique. COMPARISON:  11/12/2019 FINDINGS: Lower chest:  No contributory findings. Hepatobiliary: Hepatic steatosis. The caudate lobe and fissures are prominent in size with borderline surface lobulation.Absent  gallbladder. No biliary dilatation. Pancreas: Generalized fatty infiltration. Spleen: Unremarkable. Adrenals/Urinary Tract: Negative adrenals. Mild right hydronephrosis and low-density renal expansion with perinephric stranding. No ureteral calculus or other obstructing process seen currently. Unremarkable bladder for the degree of distension. Stomach/Bowel: No obstruction. No visible bowel inflammation. Numerous colonic diverticula. Vascular/Lymphatic: No acute vascular abnormality. Mild scattered atheromatous calcification of the aorta. No mass or adenopathy. Reproductive:Hysterectomy Other: No ascites or pneumoperitoneum. Musculoskeletal: No acute abnormalities.  Generalized degeneration. IMPRESSION: Right hydronephrosis and perinephric stranding but no underlying calculus or other visible obstructive process. Hepatic steatosis and potential for cirrhosis. Extensive colonic diverticulosis. Electronically Signed   By: Ronnette Coke M.D.   On: 05/10/2023 06:27   _______________________________________________________________________________________________________ Latest  Blood pressure 132/62, pulse 89, temperature 97.9 F (36.6 C), temperature source Oral, resp. rate 16, SpO2 92%.   Vitals  labs and radiology finding personally reviewed  Review of Systems:    Pertinent positives include:   chills, fatigue, dysuria, flank pain.  Constitutional:  No weight loss, night sweats, Fevers, weight loss  HEENT:  No headaches, Difficulty swallowing,Tooth/dental problems,Sore throat,  No sneezing, itching, ear ache, nasal congestion, post nasal drip,  Cardio-vascular:  No chest pain, Orthopnea, PND, anasarca, dizziness, palpitations.no Bilateral lower extremity swelling  GI:  No heartburn, indigestion, abdominal pain, nausea, vomiting, diarrhea, change in bowel habits, loss of appetite, melena, blood in stool, hematemesis Resp:  no shortness of breath at rest. No dyspnea on exertion, No excess mucus,  no productive cough, No non-productive cough, No coughing up of blood.No change in color of mucus.No wheezing. Skin:  no rash or lesions. No jaundice GU:  no  change in color of urine, no urgency or frequency. No straining to urinate.  No Musculoskeletal:  No joint pain or no joint swelling. No decreased range of motion. No back pain.  Psych:  No change in mood or affect. No depression or anxiety. No memory loss.  Neuro: no localizing neurological complaints, no tingling, no weakness, no double vision, no gait abnormality, no slurred speech, no confusion  All systems reviewed and apart from HOPI all are negative _______________________________________________________________________________________________ Past Medical History:   Past Medical History:  Diagnosis Date   Allergy    Arthritis    Diabetes mellitus without complication (HCC)    Difficulty sleeping    Diverticulitis    Endometrial cancer (HCC) 10/2013   grade 1 endometrioid adenocarcinoma, s/p total hysterectomy w/ BSO, lymph node biopsy, LOA for treatment by Dr. Pearly Bound   GERD (gastroesophageal reflux disease)    History of bronchitis    History of gout    Hypertension    Hypothyroid    Skin cancer (melanoma) (  HCC)    to be removed 12/09/13      Past Surgical History:  Procedure Laterality Date   BREAST BIOPSY Right    CHOLECYSTECTOMY     ROBOTIC ASSISTED TOTAL HYSTERECTOMY WITH BILATERAL SALPINGO OOPHERECTOMY Bilateral 11/23/2013   Procedure: ROBOTIC ASSISTED TOTAL HYSTERECTOMY WITH BILATERAL SALPINGO OOPHORECTOMY, LYMPH NODE DISSECTION ,LYSIS OF ADHESIONS,PELVIC WASHINGS ;  Surgeon: Alphonso Aschoff, MD;  Location: [facility];  Service: Gynecology;  Laterality: Bilateral;   skin cancer removal     melanoma removed from back    SUTURE REMOVAL N/A 11/23/2013   Procedure: SUTURE REMOVAL;  Surgeon: Alphonso Aschoff, MD;  Location: [facility];  Service: Gynecology;  Laterality: N/A;   THYROIDECTOMY      Social History:  Ambulatory    independently      reports that she quit smoking about 15 years ago. Her smoking use included cigarettes. She has never used smokeless tobacco. She reports that she does not drink alcohol and does not use drugs.     Family History:   Family History  Problem Relation Age of Onset   Stroke Mother    Heart disease Mother    Cancer Father 79       pancreatic   Diabetes Father    Cancer Brother 91       leukemia   Diabetes Paternal Uncle    ______________________________________________________________________________________________ Allergies: Allergies  Allergen Reactions   Glimepiride Other (See Comments)    Per patient causes severe gas pain   Morphine And Codeine Nausea And Vomiting   Semaglutide(0.25 Or 0.5mg -Dos) Other (See Comments)   Colcrys [Colchicine] Rash   Lisinopril Rash     Prior to Admission medications   Medication Sig Start Date End Date Taking? Authorizing Provider  augmented betamethasone dipropionate (DIPROLENE-AF) 0.05 % cream Apply 1 application  topically 2 (two) times daily as needed. 04/02/23  Yes [provider]  cephALEXin (KEFLEX) 500 MG capsule Take 1 capsule (500 mg total) by mouth 4 (four) times daily for 10 days. 05/10/23 05/20/23 Yes Carie Charity, PA-C  FARXIGA 5 MG TABS tablet Take 5 mg by mouth daily. 05/01/21  Yes [provider]  Fluocinolone Acetonide 0.01 % OIL Place 5 drops in ear(s) 2 (two) times daily as needed ("eczema in ears"). 04/21/18  Yes Sagardia, Emelle, MD  glipiZIDE (GLUCOTROL XL) 10 MG 24 hr tablet Take 10 mg by mouth every morning. 04/25/21  Yes [provider]  JANUVIA 100 MG tablet Take 100 mg by mouth daily. 03/26/23  Yes [provider]  levocetirizine (XYZAL) 5 MG tablet Take 5 mg by mouth daily. 07/18/19  Yes [provider]  losartan-hydrochlorothiazide (HYZAAR) 100-25 MG tablet Take 1 tablet by mouth daily.   Yes [provider]  meloxicam (MOBIC) 7.5 MG tablet  Take 7.5 mg by mouth daily as needed for pain. 01/14/23  Yes [provider]  metFORMIN (GLUCOPHAGE) 500 MG tablet Take 1,000 mg by mouth daily with breakfast.   Yes [provider]  metoprolol succinate (TOPROL-XL) 100 MG 24 hr tablet Take 1.5 tablets (150 mg total) by mouth daily. Take with or immediately following a meal. 03/21/23  Yes Branch, Tomas Fountain, MD  montelukast (SINGULAIR) 10 MG tablet Take 1 tablet (10 mg total) by mouth at bedtime. 02/11/18  Yes Cranston Dk, MD  MULTIPLE VITAMIN PO Take 1 tablet by mouth daily.   Yes [provider]  omega-3 acid ethyl esters (LOVAZA) 1 g capsule Take 2 capsules by  mouth daily. 04/23/23  Yes [provider]  ondansetron (ZOFRAN-ODT) 4 MG disintegrating tablet Take 1 tablet (4 mg total) by mouth every 8 (eight) hours as needed for nausea or vomiting. 05/10/23  Yes Carie Charity, PA-C  pantoprazole (PROTONIX) 40 MG tablet Take 1 tablet (40 mg total) by mouth every evening. 02/11/18  Yes Cranston Dk, MD  pravastatin (PRAVACHOL) 40 MG tablet Take 40 mg by mouth daily.   Yes [provider]  PROAIR HFA 108 (90 Base) MCG/ACT inhaler INHALE 2 PUFFS INTO THE LUNGS EVERY 4 (FOUR) HOURS AS NEEDED FOR WHEEZING OR SHORTNESS OF BREATH. 05/13/18  Yes Cranston Dk, MD  tamsulosin (FLOMAX) 0.4 MG CAPS capsule Take 0.4 mg by mouth daily.   Yes [provider]  traMADol (ULTRAM) 50 MG tablet Take 1 tablet (50 mg total) by mouth every 8 (eight) hours as needed for severe pain. Patient taking differently: Take 50 mg by mouth every 12 (twelve) hours as needed for severe pain (pain score 7-10). 04/21/18  Yes Sagardia, Isidro Margo, MD  vitamin B-12 (CYANOCOBALAMIN) 1000 MCG tablet Take 5,000 mcg by mouth daily.   Yes [provider]  Vitamin D, Ergocalciferol, (DRISDOL) 1.25 MG (50000 UT) CAPS capsule TAKE 1 CAPSULE (50,000 UNITS TOTAL) BY MOUTH 3 (THREE) TIMES A WEEK. 01/12/18  Yes Cranston Dk, MD  blood glucose meter kit  and supplies KIT Dispense based on patient and insurance preference. Use up to four times daily as directed. (FOR ICD-9 250.00, 250.01). 03/17/18   Cranston Dk, MD  cyclobenzaprine (FLEXERIL) 10 MG tablet Take 1 tablet (10 mg total) by mouth at bedtime. 10/26/17   Cranston Dk, MD  glucose blood test strip E11.9. Dispense type to match meter. Use bid to check sugars. Use as instructed 03/30/18   Cranston Dk, MD  HYDROcodone-acetaminophen (NORCO/VICODIN) 5-325 MG tablet Take 1 tablet by mouth every 6 (six) hours as needed for severe pain. Patient not taking: Reported on 05/10/2023 11/12/19   Lavinia Pouch, PA-C  Lancets Misc. MISC E11.9 Dispense type to match meter. 03/30/18   Cranston Dk, MD    ___________________________________________________________________________________________________ Physical Exam:    05/10/2023   11:04 PM 05/10/2023   10:05 PM 05/10/2023   10:00 PM  Vitals with BMI  Systolic 132    Diastolic 62    Pulse 89 76 76    1. General:  in No  Acute distress    Chronically ill   -appearing 2. Psychological: Alert and   Oriented 3. Head/ENT:    Dry Mucous Membranes                          Head Non traumatic, neck supple                      Poor Dentition 4. SKIN: decreased Skin turgor,  Skin clean Dry and intact no rash    5. Heart: Regular rate and rhythm no  Murmur, no Rub or gallop 6. Lungs , no wheezes or crackles   7. Abdomen: Soft,  non-tender, Non distended   obese  bowel sounds present 8. Lower extremities: no clubbing, cyanosis, no  edema 9. Neurologically Grossly intact, moving all 4 extremities equally  intact 10. MSK: Normal range of motion    Chart has been reviewed  ______________________________________________________________________________________________  Assessment/Plan 68 y.o. female with medical history significant of kidney stones, HTN, DM2, chronic pain, endometrial  cancer status post total hysterectomy gout palpitations, GERD HTN    Admitted for   Pyelonephritis     Present on Admission:  Pyelonephritis  Chronic pain syndrome  Endometrial cancer (HCC)  Essential hypertension, benign  Fatty infiltration of liver  Hypokalemia  SVT (supraventricular tachycardia) (HCC)  Hyponatremia   Chronic pain syndrome May affect inpatient management as patient has been exposed to narcotics in the past  Endometrial cancer [facility]) Now status post hysterectomy chronic  Essential hypertension, benign Allow permissive hypertension  Fatty infiltration of liver CT scan worrisome now for cirrhosis will obtain right upper quadrant ultrasound tomorrow  Hx of total thyroidectomy with radical neck dissection TSH in the past was within normal limits repeat  Pyelonephritis Continue Rocephin IV obtain urine cultures adjust antibiotics as needed CT scan showed no evidence of obstructing lesion but there is evidence of prior hydro  Type II diabetes mellitus, uncontrolled Order sliding scale]\ Hold p.o. medications  Hypokalemia - will replace electrolytes and repeat  check Mg, phos and Ca level and replace as needed Monitor on telemetry   Lab Results  Component Value Date   K 3.2 (L) 05/10/2023     Lab Results  Component Value Date   CREATININE 0.84 05/10/2023   Lab Results  Component Value Date   MG 1.7 09/03/2013   Lab Results  Component Value Date   CALCIUM 8.6 (L) 05/10/2023   PHOS 3.1 09/03/2013     SVT (supraventricular tachycardia) (HCC) Possible SVT runs restart toprol  Hyponatremia In the setting of dehydration will rehydrate and recheck    Other plan as per orders.  DVT prophylaxis:  SCD     Code Status:    Code Status: Prior FULL CODE  as per patient   I had personally discussed CODE STATUS with patient   ACP   none   Family Communication:   Family not at  Bedside    Diet clear   Disposition Plan:       To home once workup is complete and patient is stable   Following barriers for  discharge:                                                        Electrolytes corrected      Consult Orders  (From admission, onward)           Start     Ordered   05/10/23 2257  Consult to hospitalist  Once       Provider:  (Not yet assigned)  Question Answer Comment  Place call to: [facility]   Reason for Consult Admit      05/10/23 2256                Admission status:  ED Disposition     ED Disposition  Admit   Condition  --   Comment  Hospital [facility] [[id number]]  Level of Care: Progressive [102]  Admit to Progressive based on following criteria: MULTISYSTEM THREATS such as stable sepsis, metabolic/electrolyte imbalance with or without encephalopathy that is responding to early treatment.  May admit patient to [facility] or [facility] if equivalent level of care is available:: No  Covid Evaluation: Asymptomatic - no recent exposure (last 10 days) testing not required  Diagnosis: Pyelonephritis [[id number]]  Admitting Physician: Adal Sereno [[id number]]  Attending Physician: Bryonna Sundby [[id number]]  Certification:: I certify this patient will need inpatient services for at least 2 midnights  Expected Medical Readiness: 05/12/2023            inpatient     I Expect 2 midnight stay secondary to severity of patient's current illness need for inpatient interventions justified by the following:    Severe lab/radiological/exam abnormalities including:    pyelonephritis and extensive comorbidities including: Chronic pain  DM2   That are currently affecting medical management.   I expect  patient to be hospitalized for 2 midnights requiring inpatient medical care.  Patient is at high risk for adverse outcome (such as loss of life or disability) if not treated.  Indication for inpatient stay as follows:    severe pain requiring acute inpatient management,  inability to maintain oral hydration     Need for IV  antibiotics, IV fluids,       Level of care      progressive   tele indefinitely please discontinue once patient no longer qualifies COVID-19 Labs   Aeva Posey 05/11/2023, 12:51 AM     [facility]     after 2 AM please page floor coverage PA If 7AM-7PM, please contact the day team taking care of the patient using [email or web address]

## 2023-05-10 NOTE — Assessment & Plan Note (Addendum)
Order sliding scale Hold p.o. medications 

## 2023-05-10 NOTE — ED Notes (Signed)
 Patient transported to CT

## 2023-05-10 NOTE — Assessment & Plan Note (Signed)
-   will replace electrolytes and repeat  check Mg, phos and Ca level and replace as needed Monitor on telemetry   Lab Results  Component Value Date   K 3.2 (L) 05/10/2023     Lab Results  Component Value Date   CREATININE 0.84 05/10/2023   Lab Results  Component Value Date   MG 1.7 09/03/2013   Lab Results  Component Value Date   CALCIUM 8.6 (L) 05/10/2023   PHOS 3.1 09/03/2013

## 2023-05-10 NOTE — Assessment & Plan Note (Signed)
 Allow permissive hypertension

## 2023-05-10 NOTE — ED Triage Notes (Signed)
 BIBA Per EMS: Pt coming from home w/ c/o not being able to urinate x 2 days. Was seen for same & dx w/ urinary infection. N/V as well.

## 2023-05-10 NOTE — Discharge Instructions (Addendum)
 Today you were seen for pyelonephritis.  Please pick up your antibiotic and Zofran and take as prescribed.  Please follow-up with your primary care in the upcoming week for evaluation of treatment and possible further evaluation.  Thank you for letting us  treat you today. After reviewing your labs and imaging, I feel you are safe to go home. Please follow up with your PCP in the next several days and provide them with your records from this visit. Return to the Emergency Room if pain becomes severe or symptoms worsen.

## 2023-05-10 NOTE — ED Provider Notes (Signed)
 [place] EMERGENCY DEPARTMENT AT [facility] Provider Note   CSN: [id number] Arrival date & time: 05/10/23  0426     History  Chief Complaint  Patient presents with   Flank Pain    Donna [name] is a 68 y.o. female who presents with concern for right-sided flank pain since Tuesday.  Initially left-sided pain now primarily on the right.  Hematuria since that time as well now with chills and pain persisting.  History of multiple kidney stones, has urology appointment pending.  Now with nausea vomiting with NBNB emesis secondary to pain.  I reviewed her medical records.  She of hypertension, type 2 diabetes, GERD, endometrial cancer status post hysterectomy.  She is not anticoagulated.  HPI     Home Medications Prior to Admission medications   Medication Sig Start Date End Date Taking? Authorizing Provider  cephALEXin (KEFLEX) 500 MG capsule Take 1 capsule (500 mg total) by mouth 4 (four) times daily for 10 days. 05/10/23 05/20/23 Yes Carie Charity, PA-C  blood glucose meter kit and supplies KIT Dispense based on patient and insurance preference. Use up to four times daily as directed. (FOR ICD-9 250.00, 250.01). 03/17/18   Cranston Dk, MD  BYDUREON BCISE 2 MG/0.85ML AUIJ INJECT 2 MG INTO THE SKIN ONCE A WEEK. 07/03/18   Stallings, Zoe A, MD  ciprofloxacin (CIPRO) 500 MG tablet Take 1 tablet (500 mg total) by mouth 2 (two) times daily. 11/12/19   Margarete Sharps, Margaux, PA-C  cyclobenzaprine (FLEXERIL) 10 MG tablet Take 1 tablet (10 mg total) by mouth at bedtime. 10/26/17   Cranston Dk, MD  famotidine (PEPCID) 20 MG tablet Take 1 tablet (20 mg total) by mouth 2 (two) times daily as needed for heartburn or indigestion. 02/11/18   Cranston Dk, MD  FARXIGA 5 MG TABS tablet Take 5 mg by mouth daily. 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 04/21/18   Sagardia, Isidro Margo, MD   glipiZIDE (GLUCOTROL XL) 10 MG 24 hr tablet Take 10 mg by mouth every morning. 04/25/21   [provider]  glucose blood test strip E11.9. Dispense type to match meter. Use bid to check sugars. Use as instructed 03/30/18   Cranston Dk, MD  HYDROcodone-acetaminophen (NORCO/VICODIN) 5-325 MG tablet Take 1 tablet by mouth every 6 (six) hours as needed for severe pain. 11/12/19   Venter, Margaux, PA-C  ipratropium (ATROVENT) 0.06 % nasal spray Place 2 sprays into both nostrils 3 (three) times daily as needed for rhinitis. Reported on 03/23/2015    [provider]  Lancets Misc. MISC E11.9 Dispense type to match meter. 03/30/18   Cranston Dk, MD  levocetirizine (XYZAL) 5 MG tablet Take 5 mg by mouth every morning. 07/18/19   [provider]  loratadine (CLARITIN) 10 MG tablet Take 1 tablet (10 mg total) by mouth daily. 02/11/18   Cranston Dk, MD  losartan-hydrochlorothiazide (HYZAAR) 100-12.5 MG tablet Take 1 tablet by mouth daily. 10/25/17   Cranston Dk, MD  metformin (FORTAMET) 1000 MG (OSM) 24 hr tablet TAKE 2 TABLETS (2,000 MG TOTAL) AT BEDTIME 02/11/18   Cranston Dk, MD  metFORMIN (GLUCOPHAGE) 500 MG tablet Take 500 mg by mouth 2 (two) times daily.    [provider]  metoprolol succinate (TOPROL-XL) 100 MG 24 hr tablet Take 1.5 tablets (150 mg total) by mouth daily.  Take with or immediately following a meal. 03/21/23   Bridgette Campus, MD  montelukast (SINGULAIR) 10 MG tablet Take 1 tablet (10 mg total) by mouth at bedtime. 02/11/18   Cranston Dk, MD  MULTIPLE VITAMIN PO Take by mouth daily.    [provider]  pantoprazole (PROTONIX) 40 MG tablet Take 1 tablet (40 mg total) by mouth every evening. 02/11/18   Cranston Dk, MD  pravastatin (PRAVACHOL) 20 MG tablet TAKE 1 TABLET BY MOUTH EVERY DAY 07/08/18   Elyce Hams, Marguerita Shih, MD  PROAIR HFA 108 [phone] Base) MCG/ACT inhaler INHALE 2 PUFFS INTO THE LUNGS EVERY 4 (FOUR) HOURS AS NEEDED FOR WHEEZING OR SHORTNESS OF BREATH.  05/13/18   Cranston Dk, MD  sodium chloride (OCEAN) 0.65 % SOLN nasal spray Place 1 spray into both nostrils as needed for congestion. 07/13/16   Cranston Dk, MD  traMADol (ULTRAM) 50 MG tablet Take 1 tablet (50 mg total) by mouth every 8 (eight) hours as needed for severe pain. 04/21/18   Elvira Hammersmith, MD  vitamin B-12 (CYANOCOBALAMIN) 1000 MCG tablet Take 5,000 mcg by mouth daily.    [provider]  Vitamin D, Ergocalciferol, (DRISDOL) 1.25 MG (50000 UT) CAPS capsule TAKE 1 CAPSULE (50,000 UNITS TOTAL) BY MOUTH 3 (THREE) TIMES A WEEK. 01/12/18   Cranston Dk, MD      Allergies    Glimepiride, Morphine and codeine, Semaglutide(0.25 or 0.5mg -dos), Colcrys [colchicine], and Lisinopril    Review of Systems   Review of Systems  Constitutional:  Positive for chills, fatigue and fever.  Respiratory: Negative.    Cardiovascular: Negative.   Gastrointestinal:  Positive for abdominal pain, nausea and vomiting.  Genitourinary:  Positive for flank pain, hematuria and urgency. Negative for dysuria.    Physical Exam Updated Vital Signs BP (!) 163/95 (BP Location: Right Arm)   Pulse 67   Temp 97.8 F (36.6 C) (Oral)   Resp 17   SpO2 96%  Physical Exam Vitals and nursing note reviewed.  Constitutional:      Appearance: She is obese. She is not ill-appearing or toxic-appearing.  HENT:     Head: Normocephalic and atraumatic.     Mouth/Throat:     Mouth: Mucous membranes are moist.     Pharynx: No oropharyngeal exudate or posterior oropharyngeal erythema.  Eyes:     General:        Right eye: No discharge.        Left eye: No discharge.     Conjunctiva/sclera: Conjunctivae normal.  Cardiovascular:     Rate and Rhythm: Normal rate and regular rhythm.     Pulses: Normal pulses.     Heart sounds: Normal heart sounds. No murmur heard. Pulmonary:     Effort: Pulmonary effort is normal. No respiratory distress.     Breath sounds: Normal breath sounds. No wheezing or rales.   Abdominal:     General: Bowel sounds are normal. There is no distension.     Palpations: Abdomen is soft.     Tenderness: There is generalized abdominal tenderness. There is right CVA tenderness and left CVA tenderness. There is no guarding or rebound.  Musculoskeletal:        General: No deformity.     Cervical back: Neck supple.     Right lower leg: No edema.     Left lower leg: No edema.  Skin:    General: Skin is warm and dry.  Neurological:  General: No focal deficit present.     Mental Status: She is alert and oriented to person, place, and time. Mental status is at baseline.  Psychiatric:        Mood and Affect: Mood normal.     ED Results / Procedures / Treatments   Labs (all labs ordered are listed, but only abnormal results are displayed) Labs Reviewed  CBC - Abnormal; Notable for the following components:      Result Value   WBC 15.6 (*)    RBC 5.38 (*)    Hemoglobin 15.4 (*)    HCT 48.0 (*)    All other components within normal limits  COMPREHENSIVE METABOLIC PANEL WITH GFR - Abnormal; Notable for the following components:   Chloride 96 (*)    Glucose, Bld 335 (*)    Total Protein 8.6 (*)    All other components within normal limits  URINALYSIS, ROUTINE W REFLEX MICROSCOPIC    EKG None  Radiology CT RENAL STONE STUDY Result Date: 05/10/2023 CLINICAL DATA:  Abdominal pain with cysts stone suspected EXAM: CT ABDOMEN AND PELVIS WITHOUT CONTRAST TECHNIQUE: Multidetector CT imaging of the abdomen and pelvis was performed following the standard protocol without IV contrast. RADIATION DOSE REDUCTION: This exam was performed according to the departmental dose-optimization program which includes automated exposure control, adjustment of the mA and/or kV according to patient size and/or use of iterative reconstruction technique. COMPARISON:  11/12/2019 FINDINGS: Lower chest:  No contributory findings. Hepatobiliary: Hepatic steatosis. The caudate lobe and fissures  are prominent in size with borderline surface lobulation.Absent gallbladder. No biliary dilatation. Pancreas: Generalized fatty infiltration. Spleen: Unremarkable. Adrenals/Urinary Tract: Negative adrenals. Mild right hydronephrosis and low-density renal expansion with perinephric stranding. No ureteral calculus or other obstructing process seen currently. Unremarkable bladder for the degree of distension. Stomach/Bowel: No obstruction. No visible bowel inflammation. Numerous colonic diverticula. Vascular/Lymphatic: No acute vascular abnormality. Mild scattered atheromatous calcification of the aorta. No mass or adenopathy. Reproductive:Hysterectomy Other: No ascites or pneumoperitoneum. Musculoskeletal: No acute abnormalities.  Generalized degeneration. IMPRESSION: Right hydronephrosis and perinephric stranding but no underlying calculus or other visible obstructive process. Hepatic steatosis and potential for cirrhosis. Extensive colonic diverticulosis. Electronically Signed   By: Ronnette Coke M.D.   On: 05/10/2023 06:27    Procedures Procedures    Medications Ordered in ED Medications  cefTRIAXone (ROCEPHIN) 2 g in sodium chloride 0.9 % 100 mL IVPB (2 g Intravenous New Bag/Given 05/10/23 0647)  ondansetron (ZOFRAN) injection 4 mg (4 mg Intravenous Given 05/10/23 1478)  HYDROmorphone (DILAUDID) injection 0.5 mg (0.5 mg Intravenous Given 05/10/23 0614)  lactated ringers bolus 1,000 mL (1,000 mLs Intravenous New Bag/Given 05/10/23 0626)    ED Course/ Medical Decision Making/ A&P                                 Medical Decision Making 68 year old female with history of kidney stones who presents with concern for flank pain, chills, nausea vomiting and hematuria. Hypertensive on intake, vital signs otherwise normal.  Cardiopulmonary exam is unremarkable, abdominal exam with bilateral CVAT and generalized tenderness palpation without rebound or guarding.  The differential diagnosis of emergent  flank pain includes, but is not limited to: Nephrolithiasis/ Renal Colic, Pyelonephritis, Abdominal aortic aneurysm, Aortic dissection, Renal artery embolism, Renal vein thrombosis, Renal infarction, Renal hemorrhage, Mesenteric ischemia, Bladder tumor, Cystitis, Biliary colic, Pancreatitis, Perforated peptic ulcer,  Appendicitis, Inguinal Hernia, Diverticulitis, Bowel obstruction. Shingles, Lower lobe  pneumonia, Retroperitoneal hematoma/abscess/tumor, Epidural abscess, Epidural hematoma.  Particularly in females it is important to consider (Ectopic Pregnancy,PID/TOA,Ovarian cyst, Ovarian torsion, STD).   Amount and/or Complexity of Data Reviewed Labs: ordered.    Details: CBC with leukocytosis of 15, mildly elevated hemoglobin of 15.  CMP with hyperglycemia of 335.  Radiology: ordered.    Details: CT with pyelo, no visible stone.   Risk Prescription drug management.   Abx initiated for pyelo. Pending urine studies and PO challenge. Nontoxic appearing, improved after meds in the ED.  Care of this patient signed out to oncoming ED provider Judie Noun, PA-C at time of shift change. All pertinent HPI, physical exam, and laboratory findings were discussed with them prior to my departure. Disposition of patient pending completion of workup, reevaluation, and clinical judgement of oncoming ED provider.  This chart was dictated using voice recognition software, Dragon. Despite the best efforts of this provider to proofread and correct errors, errors may still occur which can change documentation meaning.        Final Clinical Impression(s) / ED Diagnoses Final diagnoses:  Pyelonephritis    Rx / DC Orders ED Discharge Orders          Ordered    cephALEXin (KEFLEX) 500 MG capsule  4 times daily        05/10/23 0646              Hilda Rynders, Adelle Agent, PA-C 05/10/23 0652    Palumbo, April, MD 05/10/23 [phone]

## 2023-05-10 NOTE — Assessment & Plan Note (Signed)
 May affect inpatient management as patient has been exposed to narcotics in the past

## 2023-05-10 NOTE — Assessment & Plan Note (Signed)
 Now status post hysterectomy chronic

## 2023-05-10 NOTE — Assessment & Plan Note (Signed)
 TSH in the past was within normal limits repeat

## 2023-05-10 NOTE — Assessment & Plan Note (Signed)
 CT scan worrisome now for cirrhosis will obtain right upper quadrant ultrasound tomorrow

## 2023-05-10 NOTE — ED Provider Notes (Signed)
 Patient signed out to myself by Jarvis Mesa, PA-C pending UA and likely discharge on p.o. meds for pyelonephritis.  Please refer to their note for full HPI, ROS, PE, and MDM.  Patient with a past medical history significant for kidney stones presents today for right flank pain since Tuesday.  Patient also endorses nausea, vomiting, and chills.  Patient denies fever, chest pain, shortness of breath, or abdominal pain.   Physical Exam  BP 138/69 (BP Location: Right Arm)   Pulse 67   Temp 98.2 F (36.8 C)   Resp 16   SpO2 98%   Physical Exam Vitals and nursing note reviewed.  Constitutional:      General: She is not in acute distress.    Appearance: Normal appearance. She is not toxic-appearing.  HENT:     Head: Normocephalic and atraumatic.     Right Ear: External ear normal.     Left Ear: External ear normal.     Nose: Nose normal.  Eyes:     Extraocular Movements: Extraocular movements intact.  Cardiovascular:     Rate and Rhythm: Normal rate.  Pulmonary:     Effort: Pulmonary effort is normal. No respiratory distress.  Musculoskeletal:     Cervical back: Neck supple.  Skin:    General: Skin is warm and dry.     Capillary Refill: Capillary refill takes less than 2 seconds.  Neurological:     General: No focal deficit present.     Mental Status: She is alert.  Psychiatric:        Mood and Affect: Mood normal.     Procedures  Procedures  ED Course / MDM    Medical Decision Making Amount and/or Complexity of Data Reviewed Labs: ordered. Radiology: ordered.  Risk Prescription drug management.    Patient has leukocytosis of 15.6 and CT neck showed right hydronephrosis and perinephric stranding without obstructive process noted. UA: Brown, greater than 500 glucose, moderate hemoglobin, 20 ketones, positive nitrites, 100 protein, elevated specific gravity greater than 50 WBCs, greater than 50 RBCs.   Consider for admission or further workup however patient  vital signs, physical exam, labs, and imaging are reassuring.  Patient symptoms likely due to pyelonephritis.  Patient given Rocephin while in ED.  Patient given outpatient course of Keflex for pyelonephritis.  Patient should follow-up with her primary care in the upcoming week for evaluation of treatment and possible further treatment.  Patient also given Zofran for nausea.  I feel patient is safer discharge at this time.    Carie Charity, PA-C 05/10/23 0912    Tegeler, Marine Sia, MD 05/10/23 1550

## 2023-05-10 NOTE — Assessment & Plan Note (Addendum)
 Continue Rocephin IV obtain urine cultures adjust antibiotics as needed CT scan showed no evidence of obstructing lesion but there is evidence of prior hydro

## 2023-05-11 ENCOUNTER — Other Ambulatory Visit: Payer: Self-pay

## 2023-05-11 ENCOUNTER — Inpatient Hospital Stay (HOSPITAL_COMMUNITY)

## 2023-05-11 DIAGNOSIS — N12 Tubulo-interstitial nephritis, not specified as acute or chronic: Secondary | ICD-10-CM | POA: Diagnosis not present

## 2023-05-11 DIAGNOSIS — E871 Hypo-osmolality and hyponatremia: Secondary | ICD-10-CM | POA: Diagnosis present

## 2023-05-11 DIAGNOSIS — I471 Supraventricular tachycardia, unspecified: Secondary | ICD-10-CM | POA: Diagnosis present

## 2023-05-11 LAB — MAGNESIUM
Magnesium: 1.9 mg/dL (ref 1.7–2.4)
Magnesium: 2 mg/dL (ref 1.7–2.4)

## 2023-05-11 LAB — COMPREHENSIVE METABOLIC PANEL WITH GFR
ALT: 25 U/L (ref 0–44)
AST: 32 U/L (ref 15–41)
Albumin: 3.4 g/dL — ABNORMAL LOW (ref 3.5–5.0)
Alkaline Phosphatase: 52 U/L (ref 38–126)
Anion gap: 7 (ref 5–15)
BUN: 23 mg/dL (ref 8–23)
CO2: 25 mmol/L (ref 22–32)
Calcium: 7.9 mg/dL — ABNORMAL LOW (ref 8.9–10.3)
Chloride: 101 mmol/L (ref 98–111)
Creatinine, Ser: 0.91 mg/dL (ref 0.44–1.00)
GFR, Estimated: >60 mL/min (ref >60)
Glucose, Bld: 216 mg/dL — ABNORMAL HIGH (ref 70–99)
Potassium: 3.4 mmol/L — ABNORMAL LOW (ref 3.5–5.1)
Sodium: 133 mmol/L — ABNORMAL LOW (ref 135–145)
Total Bilirubin: 0.6 mg/dL (ref 0.0–1.2)
Total Protein: 6.7 g/dL (ref 6.5–8.1)

## 2023-05-11 LAB — CBC
HCT: 38.6 % (ref 36.0–46.0)
Hemoglobin: 12.6 g/dL (ref 12.0–15.0)
MCH: 28.7 pg (ref 26.0–34.0)
MCHC: 32.6 g/dL (ref 30.0–36.0)
MCV: 87.9 fL (ref 80.0–100.0)
Platelets: 300 10*3/uL (ref 150–400)
RBC: 4.39 MIL/uL (ref 3.87–5.11)
RDW: 13.1 % (ref 11.5–15.5)
WBC: 14.2 10*3/uL — ABNORMAL HIGH (ref 4.0–10.5)
nRBC: 0 % (ref 0.0–0.2)

## 2023-05-11 LAB — GLUCOSE, CAPILLARY
Glucose-Capillary: 173 mg/dL — ABNORMAL HIGH (ref 70–99)
Glucose-Capillary: 157 mg/dL — ABNORMAL HIGH (ref 70–99)
Glucose-Capillary: 180 mg/dL — ABNORMAL HIGH (ref 70–99)
Glucose-Capillary: 209 mg/dL — ABNORMAL HIGH (ref 70–99)
Glucose-Capillary: 137 mg/dL — ABNORMAL HIGH (ref 70–99)

## 2023-05-11 LAB — BETA-HYDROXYBUTYRIC ACID: Beta-Hydroxybutyric Acid: 2.8 mmol/L — ABNORMAL HIGH (ref 0.05–0.27)

## 2023-05-11 LAB — HEPATITIS PANEL, ACUTE
HCV Ab: NONREACTIVE
Hep A IgM: NONREACTIVE
Hep B C IgM: NONREACTIVE
Hepatitis B Surface Ag: NONREACTIVE

## 2023-05-11 LAB — PROTIME-INR
INR: 1.1 (ref 0.8–1.2)
Prothrombin Time: 14.3 s (ref 11.4–15.2)

## 2023-05-11 LAB — PROCALCITONIN: Procalcitonin: <0.1 ng/mL

## 2023-05-11 LAB — HIV ANTIBODY (ROUTINE TESTING W REFLEX): HIV Screen 4th Generation wRfx: NONREACTIVE

## 2023-05-11 LAB — LACTIC ACID, PLASMA
Lactic Acid, Venous: 1.5 mmol/L (ref 0.5–1.9)
Lactic Acid, Venous: 1.5 mmol/L (ref 0.5–1.9)

## 2023-05-11 LAB — TSH: TSH: 1.156 u[IU]/mL (ref 0.350–4.500)

## 2023-05-11 LAB — PHOSPHORUS
Phosphorus: 3.8 mg/dL (ref 2.5–4.6)
Phosphorus: 4.1 mg/dL (ref 2.5–4.6)

## 2023-05-11 LAB — SODIUM, URINE, RANDOM: Sodium, Ur: 27 mmol/L

## 2023-05-11 LAB — CK: Total CK: 128 U/L (ref 38–234)

## 2023-05-11 LAB — LIPID PANEL
Cholesterol: 101 mg/dL (ref 0–200)
HDL: 36 mg/dL — ABNORMAL LOW (ref >40)
LDL Cholesterol: 44 mg/dL (ref 0–99)
Total CHOL/HDL Ratio: 2.8 ratio
Triglycerides: 106 mg/dL (ref <150)
VLDL: 21 mg/dL (ref 0–40)

## 2023-05-11 LAB — CREATININE, URINE, RANDOM: Creatinine, Urine: 65 mg/dL

## 2023-05-11 LAB — OSMOLALITY: Osmolality: 295 mosm/kg (ref 275–295)

## 2023-05-11 LAB — CBG MONITORING, ED: Glucose-Capillary: 285 mg/dL — ABNORMAL HIGH (ref 70–99)

## 2023-05-11 LAB — OSMOLALITY, URINE: Osmolality, Ur: 791 mosm/kg (ref 300–900)

## 2023-05-11 MED ORDER — IPRATROPIUM-ALBUTEROL 0.5-2.5 (3) MG/3ML IN SOLN: 3.0000 mL | RESPIRATORY_TRACT | Status: DC | PRN

## 2023-05-11 MED ORDER — LEVOCETIRIZINE DIHYDROCHLORIDE 5 MG PO TABS: 5.0000 mg | ORAL_TABLET | Freq: Every day | ORAL | Status: DC

## 2023-05-11 MED ORDER — SENNOSIDES-DOCUSATE SODIUM 8.6-50 MG PO TABS: 1.0000 | ORAL_TABLET | Freq: Every evening | ORAL | Status: DC | PRN

## 2023-05-11 MED ORDER — SODIUM CHLORIDE 0.9 % IV SOLN: INTRAVENOUS | Status: AC

## 2023-05-11 MED ORDER — LORATADINE 10 MG PO TABS
10.0000 mg | ORAL_TABLET | Freq: Every day | ORAL | Status: DC
  Administered 2023-05-11 – 2023-05-13 (×3): 10 mg via ORAL
  Filled 2023-05-11 (×3): qty 1

## 2023-05-11 MED ORDER — ONDANSETRON HCL 4 MG PO TABS: 4.0000 mg | ORAL_TABLET | Freq: Four times a day (QID) | ORAL | Status: DC | PRN

## 2023-05-11 MED ORDER — MONTELUKAST SODIUM 10 MG PO TABS
10.0000 mg | ORAL_TABLET | Freq: Every day | ORAL | Status: DC
  Administered 2023-05-11 – 2023-05-12 (×3): 10 mg via ORAL
  Filled 2023-05-11 (×3): qty 1

## 2023-05-11 MED ORDER — METOPROLOL TARTRATE 5 MG/5ML IV SOLN: 5.0000 mg | INTRAVENOUS | Status: DC | PRN

## 2023-05-11 MED ORDER — METOPROLOL SUCCINATE ER 50 MG PO TB24
50.0000 mg | ORAL_TABLET | Freq: Every day | ORAL | Status: DC
  Administered 2023-05-11 – 2023-05-12 (×2): 50 mg via ORAL
  Filled 2023-05-11 (×2): qty 1

## 2023-05-11 MED ORDER — POTASSIUM CHLORIDE CRYS ER 20 MEQ PO TBCR
40.0000 meq | EXTENDED_RELEASE_TABLET | Freq: Once | ORAL | Status: AC
  Administered 2023-05-11: 40 meq via ORAL
  Filled 2023-05-11: qty 2

## 2023-05-11 MED ORDER — ONDANSETRON HCL 4 MG/2ML IJ SOLN: 4.0000 mg | Freq: Four times a day (QID) | INTRAMUSCULAR | Status: DC | PRN

## 2023-05-11 MED ORDER — HYDROMORPHONE HCL 1 MG/ML IJ SOLN
0.5000 mg | INTRAMUSCULAR | Status: DC | PRN
  Administered 2023-05-11: 1 mg via INTRAVENOUS
  Filled 2023-05-11: qty 1

## 2023-05-11 MED ORDER — TRAMADOL HCL 50 MG PO TABS
50.0000 mg | ORAL_TABLET | Freq: Two times a day (BID) | ORAL | Status: DC | PRN
  Administered 2023-05-11 (×2): 50 mg via ORAL
  Filled 2023-05-11 (×2): qty 1

## 2023-05-11 MED ORDER — ACETAMINOPHEN 325 MG PO TABS: 650.0000 mg | ORAL_TABLET | Freq: Four times a day (QID) | ORAL | Status: DC | PRN

## 2023-05-11 MED ORDER — METOPROLOL SUCCINATE ER 100 MG PO TB24
100.0000 mg | ORAL_TABLET | Freq: Every day | ORAL | Status: DC
  Administered 2023-05-11 – 2023-05-13 (×3): 100 mg via ORAL
  Filled 2023-05-11 (×3): qty 1

## 2023-05-11 MED ORDER — HYDRALAZINE HCL 20 MG/ML IJ SOLN: 10.0000 mg | INTRAMUSCULAR | Status: DC | PRN

## 2023-05-11 MED ORDER — METOPROLOL TARTRATE 25 MG PO TABS
25.0000 mg | ORAL_TABLET | Freq: Once | ORAL | Status: AC
  Administered 2023-05-11: 25 mg via ORAL
  Filled 2023-05-11: qty 1

## 2023-05-11 MED ORDER — PANTOPRAZOLE SODIUM 40 MG PO TBEC
40.0000 mg | DELAYED_RELEASE_TABLET | Freq: Every evening | ORAL | Status: DC
  Administered 2023-05-11 – 2023-05-12 (×2): 40 mg via ORAL
  Filled 2023-05-11 (×2): qty 1

## 2023-05-11 MED ORDER — PRAVASTATIN SODIUM 20 MG PO TABS
40.0000 mg | ORAL_TABLET | Freq: Every day | ORAL | Status: DC
  Administered 2023-05-11 – 2023-05-13 (×3): 40 mg via ORAL
  Filled 2023-05-11 (×3): qty 2

## 2023-05-11 MED ORDER — ACETAMINOPHEN 650 MG RE SUPP: 650.0000 mg | Freq: Four times a day (QID) | RECTAL | Status: DC | PRN

## 2023-05-11 NOTE — Progress Notes (Signed)
 PROGRESS NOTE    Donna [name]  IHK:[id number] DOB: 1955-06-20 DOA: 05/10/2023 PCP: Dorena Gander, MD    Brief Narrative:  68 year old with history of DM2, HTN, renal stone, chronic pain, endometrial cancer status post total hysterectomy, gout, palpitations, GERD, HTN admitted for nausea, vomiting and dysuria.  She was found to have pyelonephritis therefore started on Rocephin.   Assessment & Plan:  Principal Problem:   Pyelonephritis Active Problems:   Essential hypertension, benign   Hx of total thyroidectomy with radical neck dissection   Endometrial cancer (HCC)   Fatty infiltration of liver   Chronic pain syndrome   Hypokalemia   SVT (supraventricular tachycardia) (HCC)   Hyponatremia   Right-sided uncomplicated pyelonephritis - Right-sided pyelonephritis without any evidence of obstruction.  Continue IV Rocephin.  Follow culture data.  IV fluids  Hepatic steatosis Right lower lobe Liver lesion - Noted to have fatty liver on the ultrasound.  Also has liver lesion.  Patient will need outpatient MRI. Will check Acute Hep panel.   Hypokalemia - As needed repletion  Hyperlipidemia - Statin  Essential hypertension - On Toprol-XL - IV as needed  Diabetes mellitus type 2 - Will place on sliding scale and Accu-Cheks  Asthma - as needed bronchodilators  Endometrial cancer status post uterine resection - Follow outpatient GYN   DVT prophylaxis: SCDs Start: 05/11/23 0047    Code Status: Full Code Family Communication:   Status is: Inpatient Remains inpatient appropriate because: Continue hospital stay for IV antibiotics    Subjective: No complaints at this time.  Still having some nausea and dysuria.   Examination:  General exam: Appears calm and comfortable  Respiratory system: Clear to auscultation. Respiratory effort normal. Cardiovascular system: S1 & S2 heard, RRR. No JVD, murmurs, rubs, gallops or clicks. No pedal edema. Gastrointestinal  system: Abdomen is nondistended, soft and nontender. No organomegaly or masses felt. Normal bowel sounds heard. Central nervous system: Alert and oriented. No focal neurological deficits. Extremities: Symmetric 5 x 5 power. Skin: No rashes, lesions or ulcers Psychiatry: Judgement and insight appear normal. Mood & affect appropriate.                Diet Orders (From admission, onward)     Start     Ordered   05/11/23 1056  Diet full liquid Room service appropriate? Yes; Fluid consistency: Thin  Diet effective now       Question Answer Comment  Room service appropriate? Yes   Fluid consistency: Thin      05/11/23 1055            Objective: Vitals:   05/11/23 0105 05/11/23 0109 05/11/23 0457 05/11/23 0909  BP: (!) 145/82  (!) 126/58 (!) 113/58  Pulse: 85  78 81  Resp:    16  Temp: 98.2 F (36.8 C)  98.3 F (36.8 C) 97.8 F (36.6 C)  TempSrc: Oral  Oral   SpO2: 97%  91% 90%  Weight:  104.4 kg    Height:  5\' 4"  (1.626 m)      Intake/Output Summary (Last 24 hours) at 05/11/2023 1152 Last data filed at 05/11/2023 0600 Gross per 24 hour  Intake 617.59 ml  Output --  Net 617.59 ml   Filed Weights   05/11/23 0109  Weight: 104.4 kg    Scheduled Meds:  insulin aspart  0-9 Units Subcutaneous Q4H   loratadine  10 mg Oral Daily   metoprolol succinate  100 mg Oral Daily   metoprolol succinate  50 mg Oral QHS   montelukast  10 mg Oral QHS   pantoprazole  40 mg Oral QPM   pravastatin  40 mg Oral Daily   Continuous Infusions:  cefTRIAXone (ROCEPHIN)  IV 1 g (05/11/23 0526)    Nutritional status     Body mass index is 39.51 kg/m.  Data Reviewed:   CBC: Recent Labs  Lab 05/10/23 0523 05/10/23 2144 05/11/23 0232  WBC 15.6* 15.4* 14.2*  NEUTROABS  --  13.0*  --   HGB 15.4* 14.2 12.6  HCT 48.0* 43.7 38.6  MCV 89.2 87.4 87.9  PLT 375 336 300   Basic Metabolic Panel: Recent Labs  Lab 05/10/23 0523 05/10/23 2144 05/11/23 0000 05/11/23 0232  NA  135 132*  --  133*  K 3.5 3.2*  --  3.4*  CL 96* 97*  --  101  CO2 26 20*  --  25  GLUCOSE 335* 272*  --  216*  BUN 23 23  --  23  CREATININE 0.95 0.84  --  0.91  CALCIUM 9.6 8.6*  --  7.9*  MG  --   --  1.9 2.0  PHOS  --   --  4.1 3.8   GFR: Estimated Creatinine Clearance: 71.6 mL/min (by C-G formula based on SCr of 0.91 mg/dL). Liver Function Tests: Recent Labs  Lab 05/10/23 0523 05/11/23 0232  AST 31 32  ALT 31 25  ALKPHOS 71 52  BILITOT 0.8 0.6  PROT 8.6* 6.7  ALBUMIN 4.4 3.4*   No results for input(s): "LIPASE", "AMYLASE" in the last 168 hours. No results for input(s): "AMMONIA" in the last 168 hours. Coagulation Profile: Recent Labs  Lab 05/11/23 0000  INR 1.1   Cardiac Enzymes: Recent Labs  Lab 05/11/23 0000  CKTOTAL 128   BNP (last 3 results) No results for input(s): "PROBNP" in the last 8760 hours. HbA1C: No results for input(s): "HGBA1C" in the last 72 hours. CBG: Recent Labs  Lab 05/11/23 0001 05/11/23 0433 05/11/23 0834 05/11/23 1145  GLUCAP 285* 173* 157* 180*   Lipid Profile: Recent Labs    05/11/23 0232  CHOL 101  HDL 36*  LDLCALC 44  TRIG 811  CHOLHDL 2.8   Thyroid Function Tests: Recent Labs    05/11/23 0232  TSH 1.156   Anemia Panel: No results for input(s): "VITAMINB12", "FOLATE", "FERRITIN", "TIBC", "IRON", "RETICCTPCT" in the last 72 hours. Sepsis Labs: Recent Labs  Lab 05/11/23 0000 05/11/23 0232  PROCALCITON <0.10  --   LATICACIDVEN 1.5 1.5    No results found for this or any previous visit (from the past 240 hours).       Radiology Studies: US  Abdomen Limited RUQ (LIVER/GB) Result Date: 05/11/2023 CLINICAL DATA:  Cirrhosis.  Cholecystectomy. EXAM: ULTRASOUND ABDOMEN LIMITED RIGHT UPPER QUADRANT COMPARISON:  CT stone study 05/10/2023 FINDINGS: Gallbladder: Surgically absent. Common bile duct: Diameter: 4 mm Liver: Markedly echogenic liver parenchyma with poor acoustic through transmission. 5.8 x 2.7 x 4.5 cm  ill-defined hypoechoic region identified in the right hepatic lobe. Portal vein is patent on color Doppler imaging with normal direction of blood flow towards the liver. Other: None. IMPRESSION: 1. Markedly echogenic liver parenchyma with poor acoustic through transmission. Imaging features compatible with hepatic steatosis. 2. 5.8 x 2.7 x 4.5 cm ill-defined hypoechoic region in the right hepatic lobe. This is nonspecific and could be related to focal fatty sparing. MRI abdomen with and without contrast recommended to further evaluate. Electronically Signed   By: Lyell Samuel  Garon Kaiser M.D.   On: 05/11/2023 08:03   DG CHEST PORT 1 VIEW Result Date: 05/11/2023 CLINICAL DATA:  [id number] Dyspnea [id number] EXAM: PORTABLE CHEST 1 VIEW COMPARISON:  Chest x-ray 11/19/2013 FINDINGS: The heart and mediastinal contours are within normal limits. No focal consolidation. No pulmonary edema. No pleural effusion. No pneumothorax. No acute osseous abnormality. Multiple vascular clips overlie bilateral neck. IMPRESSION: No active disease. Electronically Signed   By: Morgane  Naveau M.D.   On: 05/11/2023 00:39   CT RENAL STONE STUDY Result Date: 05/10/2023 CLINICAL DATA:  Abdominal pain with cysts stone suspected EXAM: CT ABDOMEN AND PELVIS WITHOUT CONTRAST TECHNIQUE: Multidetector CT imaging of the abdomen and pelvis was performed following the standard protocol without IV contrast. RADIATION DOSE REDUCTION: This exam was performed according to the departmental dose-optimization program which includes automated exposure control, adjustment of the mA and/or kV according to patient size and/or use of iterative reconstruction technique. COMPARISON:  11/12/2019 FINDINGS: Lower chest:  No contributory findings. Hepatobiliary: Hepatic steatosis. The caudate lobe and fissures are prominent in size with borderline surface lobulation.Absent gallbladder. No biliary dilatation. Pancreas: Generalized fatty infiltration. Spleen: Unremarkable.  Adrenals/Urinary Tract: Negative adrenals. Mild right hydronephrosis and low-density renal expansion with perinephric stranding. No ureteral calculus or other obstructing process seen currently. Unremarkable bladder for the degree of distension. Stomach/Bowel: No obstruction. No visible bowel inflammation. Numerous colonic diverticula. Vascular/Lymphatic: No acute vascular abnormality. Mild scattered atheromatous calcification of the aorta. No mass or adenopathy. Reproductive:Hysterectomy Other: No ascites or pneumoperitoneum. Musculoskeletal: No acute abnormalities.  Generalized degeneration. IMPRESSION: Right hydronephrosis and perinephric stranding but no underlying calculus or other visible obstructive process. Hepatic steatosis and potential for cirrhosis. Extensive colonic diverticulosis. Electronically Signed   By: Ronnette Coke M.D.   On: 05/10/2023 06:27           LOS: 1 day   Time spent= 35 mins    Maggie Schooner, MD [facility]  If 7PM-7AM, please contact night-coverage  05/11/2023, 11:52 AM

## 2023-05-11 NOTE — Hospital Course (Addendum)
 Brief Narrative:  68 year old with history of DM2, HTN, renal stone, chronic pain, endometrial cancer status post total hysterectomy, gout, palpitations, GERD, HTN admitted for nausea, vomiting and dysuria.  She was found to have pyelonephritis therefore started on Rocephin.   Assessment & Plan:  Principal Problem:   Pyelonephritis Active Problems:   Essential hypertension, benign   Hx of total thyroidectomy with radical neck dissection   Endometrial cancer (HCC)   Fatty infiltration of liver   Chronic pain syndrome   Hypokalemia   SVT (supraventricular tachycardia) (HCC)   Hyponatremia   Right-sided uncomplicated pyelonephritis - Right-sided pyelonephritis without any evidence of obstruction.  Continue IV Rocephin.  Follow culture data.  IV fluids  Hepatic steatosis Right lower lobe Liver lesion - Noted to have fatty liver on the ultrasound.  Also has liver lesion.  Patient will need outpatient MRI. Will check Acute Hep panel.   Hypokalemia - As needed repletion  Hyperlipidemia - Statin  Essential hypertension - On Toprol-XL - IV as needed  Diabetes mellitus type 2 - Will place on sliding scale and Accu-Cheks  Asthma - as needed bronchodilators  Endometrial cancer status post uterine resection - Follow outpatient GYN   DVT prophylaxis: SCDs Start: 05/11/23 0047    Code Status: Full Code Family Communication:   Status is: Inpatient Remains inpatient appropriate because: Continue hospital stay for IV antibiotics    Subjective: No complaints at this time.  Still having some nausea and dysuria.   Examination:  General exam: Appears calm and comfortable  Respiratory system: Clear to auscultation. Respiratory effort normal. Cardiovascular system: S1 & S2 heard, RRR. No JVD, murmurs, rubs, gallops or clicks. No pedal edema. Gastrointestinal system: Abdomen is nondistended, soft and nontender. No organomegaly or masses felt. Normal bowel sounds heard. Central  nervous system: Alert and oriented. No focal neurological deficits. Extremities: Symmetric 5 x 5 power. Skin: No rashes, lesions or ulcers Psychiatry: Judgement and insight appear normal. Mood & affect appropriate.

## 2023-05-11 NOTE — Assessment & Plan Note (Signed)
 Possible SVT runs restart toprol

## 2023-05-11 NOTE — Assessment & Plan Note (Signed)
 In the setting of dehydration will rehydrate and recheck

## 2023-05-12 DIAGNOSIS — N12 Tubulo-interstitial nephritis, not specified as acute or chronic: Secondary | ICD-10-CM | POA: Diagnosis not present

## 2023-05-12 LAB — BASIC METABOLIC PANEL WITH GFR
Anion gap: 7 (ref 5–15)
BUN: 19 mg/dL (ref 8–23)
CO2: 26 mmol/L (ref 22–32)
Calcium: 8 mg/dL — ABNORMAL LOW (ref 8.9–10.3)
Chloride: 106 mmol/L (ref 98–111)
Creatinine, Ser: 0.71 mg/dL (ref 0.44–1.00)
GFR, Estimated: >60 mL/min (ref >60)
Glucose, Bld: 148 mg/dL — ABNORMAL HIGH (ref 70–99)
Potassium: 3.3 mmol/L — ABNORMAL LOW (ref 3.5–5.1)
Sodium: 139 mmol/L (ref 135–145)

## 2023-05-12 LAB — GLUCOSE, CAPILLARY
Glucose-Capillary: 149 mg/dL — ABNORMAL HIGH (ref 70–99)
Glucose-Capillary: 168 mg/dL — ABNORMAL HIGH (ref 70–99)
Glucose-Capillary: 169 mg/dL — ABNORMAL HIGH (ref 70–99)
Glucose-Capillary: 171 mg/dL — ABNORMAL HIGH (ref 70–99)
Glucose-Capillary: 173 mg/dL — ABNORMAL HIGH (ref 70–99)
Glucose-Capillary: 188 mg/dL — ABNORMAL HIGH (ref 70–99)
Glucose-Capillary: 190 mg/dL — ABNORMAL HIGH (ref 70–99)

## 2023-05-12 LAB — PHOSPHORUS: Phosphorus: 2.2 mg/dL — ABNORMAL LOW (ref 2.5–4.6)

## 2023-05-12 LAB — CBC
HCT: 39.5 % (ref 36.0–46.0)
Hemoglobin: 12 g/dL (ref 12.0–15.0)
MCH: 28.4 pg (ref 26.0–34.0)
MCHC: 30.4 g/dL (ref 30.0–36.0)
MCV: 93.6 fL (ref 80.0–100.0)
Platelets: 277 10*3/uL (ref 150–400)
RBC: 4.22 MIL/uL (ref 3.87–5.11)
RDW: 13.4 % (ref 11.5–15.5)
WBC: 9.5 10*3/uL (ref 4.0–10.5)
nRBC: 0 % (ref 0.0–0.2)

## 2023-05-12 LAB — URINE CULTURE: Culture: NO GROWTH

## 2023-05-12 LAB — HEMOGLOBIN A1C
Hgb A1c MFr Bld: 7.9 % — ABNORMAL HIGH (ref 4.8–5.6)
Mean Plasma Glucose: 180 mg/dL

## 2023-05-12 LAB — MAGNESIUM: Magnesium: 2.1 mg/dL (ref 1.7–2.4)

## 2023-05-12 MED ORDER — K PHOS MONO-SOD PHOS DI & MONO 155-852-130 MG PO TABS
500.0000 mg | ORAL_TABLET | Freq: Once | ORAL | Status: AC
  Administered 2023-05-12: 500 mg via ORAL
  Filled 2023-05-12: qty 2

## 2023-05-12 MED ORDER — POTASSIUM CHLORIDE CRYS ER 20 MEQ PO TBCR
40.0000 meq | EXTENDED_RELEASE_TABLET | Freq: Once | ORAL | Status: AC
  Administered 2023-05-12: 40 meq via ORAL
  Filled 2023-05-12: qty 2

## 2023-05-12 NOTE — Plan of Care (Signed)
  Problem: Metabolic: Goal: Ability to maintain appropriate glucose levels will improve Outcome: Progressing   Problem: Clinical Measurements: Goal: Ability to maintain clinical measurements within normal limits will improve Outcome: Progressing Goal: Diagnostic test results will improve Outcome: Progressing   Problem: Pain Managment: Goal: General experience of comfort will improve and/or be controlled Outcome: Progressing

## 2023-05-12 NOTE — TOC CM/SW Note (Signed)
 Transition of Care [facility]) - Inpatient Brief Assessment   Patient Details  Name: Donna [name] MRN: [id number] Date of Birth: 04/03/1955  Transition of Care [facility]) CM/SW Contact:    Gertha Ku, LCSW Phone Number: 05/12/2023, 3:53 PM  Has PCP and no SDOH concerns.     Transition of Care Asessment: Insurance and Status: Insurance coverage has been reviewed Patient has primary care physician: Yes Home environment has been reviewed: home with self Prior level of function:: independent Prior/Current Home Services: No current home services Social Drivers of Health Review: SDOH reviewed no interventions necessary Readmission risk has been reviewed: Yes Transition of care needs: no transition of care needs at this time

## 2023-05-12 NOTE — Progress Notes (Signed)
 PROGRESS NOTE    Donna [name]  WUJ:[id number] DOB: 01-28-1956 DOA: 05/10/2023 PCP: Aliene Beams, MD    Brief Narrative:  68 year old with history of DM2, HTN, renal stone, chronic pain, endometrial cancer status post total hysterectomy, gout, palpitations, GERD, HTN admitted for nausea, vomiting and dysuria.  She was found to have pyelonephritis therefore started on Rocephin.   Assessment & Plan:  Principal Problem:   Pyelonephritis Active Problems:   Essential hypertension, benign   Hx of total thyroidectomy with radical neck dissection   Endometrial cancer (HCC)   Fatty infiltration of liver   Chronic pain syndrome   Hypokalemia   SVT (supraventricular tachycardia) (HCC)   Hyponatremia   Right-sided uncomplicated pyelonephritis - Right-sided pyelonephritis without any evidence of obstruction.  Continue IV Rocephin.  Follow culture data.  IV fluids  Hepatic steatosis Right lower lobe Liver lesion - Noted to have fatty liver on the ultrasound.  Also has liver lesion.  Patient will need outpatient MRI.  Acute hepatitis panel is negative  Hypokalemia/hypophosphatemia - As needed repletion  Hyperlipidemia - Statin  Essential hypertension - On Toprol-XL - IV as needed  Diabetes mellitus type 2 - Will place on sliding scale and Accu-Cheks  Asthma - as needed bronchodilators  Endometrial cancer status post uterine resection - Follow outpatient GYN   DVT prophylaxis: SCDs Start: 05/11/23 0047    Code Status: Full Code Family Communication:   Status is: Inpatient Remains inpatient appropriate because: Continue hospital stay for IV antibiotics    Subjective: Overall still feeling weak.  Examination:  General exam: Appears calm and comfortable  Respiratory system: Clear to auscultation. Respiratory effort normal. Cardiovascular system: S1 & S2 heard, RRR. No JVD, murmurs, rubs, gallops or clicks. No pedal edema. Gastrointestinal system: Abdomen is  nondistended, soft and nontender. No organomegaly or masses felt. Normal bowel sounds heard. Central nervous system: Alert and oriented. No focal neurological deficits. Extremities: Symmetric 5 x 5 power. Skin: No rashes, lesions or ulcers Psychiatry: Judgement and insight appear normal. Mood & affect appropriate.                Diet Orders (From admission, onward)     Start     Ordered   05/12/23 0739  Diet heart healthy/carb modified Room service appropriate? Yes; Fluid consistency: Thin  Diet effective now       Question Answer Comment  Diet-HS Snack? Nothing   Room service appropriate? Yes   Fluid consistency: Thin      05/12/23 0738            Objective: Vitals:   05/11/23 1309 05/11/23 1941 05/12/23 0450 05/12/23 0906  BP: 113/66 (!) 132/56 118/66 118/66  Pulse: 86 93 81 81  Resp: 16 18 18    Temp: 98 F (36.7 C) 98.4 F (36.9 C) 98.6 F (37 C)   TempSrc:  Oral Oral   SpO2: 93% 93% 94%   Weight:      Height:       No intake or output data in the 24 hours ending 05/12/23 1119 Filed Weights   05/11/23 0109  Weight: 104.4 kg    Scheduled Meds:  insulin aspart  0-9 Units Subcutaneous Q4H   loratadine  10 mg Oral Daily   metoprolol succinate  100 mg Oral Daily   metoprolol succinate  50 mg Oral QHS   montelukast  10 mg Oral QHS   pantoprazole  40 mg Oral QPM   pravastatin  40 mg Oral Daily  Continuous Infusions:  cefTRIAXone (ROCEPHIN)  IV 1 g (05/12/23 1610)    Nutritional status     Body mass index is 39.51 kg/m.  Data Reviewed:   CBC: Recent Labs  Lab 05/10/23 0523 05/10/23 2144 05/11/23 0232 05/12/23 0336  WBC 15.6* 15.4* 14.2* 9.5  NEUTROABS  --  13.0*  --   --   HGB 15.4* 14.2 12.6 12.0  HCT 48.0* 43.7 38.6 39.5  MCV 89.2 87.4 87.9 93.6  PLT 375 336 300 277   Basic Metabolic Panel: Recent Labs  Lab 05/10/23 0523 05/10/23 2144 05/11/23 0000 05/11/23 0232 05/12/23 0336  NA 135 132*  --  133* 139  K 3.5 3.2*  --   3.4* 3.3*  CL 96* 97*  --  101 106  CO2 26 20*  --  25 26  GLUCOSE 335* 272*  --  216* 148*  BUN 23 23  --  23 19  CREATININE 0.95 0.84  --  0.91 0.71  CALCIUM 9.6 8.6*  --  7.9* 8.0*  MG  --   --  1.9 2.0 2.1  PHOS  --   --  4.1 3.8 2.2*   GFR: Estimated Creatinine Clearance: 81.5 mL/min (by C-G formula based on SCr of 0.71 mg/dL). Liver Function Tests: Recent Labs  Lab 05/10/23 0523 05/11/23 0232  AST 31 32  ALT 31 25  ALKPHOS 71 52  BILITOT 0.8 0.6  PROT 8.6* 6.7  ALBUMIN 4.4 3.4*   No results for input(s): "LIPASE", "AMYLASE" in the last 168 hours. No results for input(s): "AMMONIA" in the last 168 hours. Coagulation Profile: Recent Labs  Lab 05/11/23 0000  INR 1.1   Cardiac Enzymes: Recent Labs  Lab 05/11/23 0000  CKTOTAL 128   BNP (last 3 results) No results for input(s): "PROBNP" in the last 8760 hours. HbA1C: Recent Labs    05/11/23 0232  HGBA1C 7.9*   CBG: Recent Labs  Lab 05/11/23 1648 05/11/23 1937 05/12/23 0006 05/12/23 0429 05/12/23 0719  GLUCAP 209* 137* 149* 169* 173*   Lipid Profile: Recent Labs    05/11/23 0232  CHOL 101  HDL 36*  LDLCALC 44  TRIG 960  CHOLHDL 2.8   Thyroid Function Tests: Recent Labs    05/11/23 0232  TSH 1.156   Anemia Panel: No results for input(s): "VITAMINB12", "FOLATE", "FERRITIN", "TIBC", "IRON", "RETICCTPCT" in the last 72 hours. Sepsis Labs: Recent Labs  Lab 05/11/23 0000 05/11/23 0232  PROCALCITON <0.10  --   LATICACIDVEN 1.5 1.5    Recent Results (from the past 240 hours)  Urine Culture     Status: None   Collection Time: 05/10/23  9:44 PM   Specimen: Urine, Clean Catch  Result Value Ref Range Status   Specimen Description   Final    URINE, CLEAN CATCH Performed at [facility], [address] [place], [place] [address]    Special Requests   Final    NONE Performed at [facility], [address] [place], [place] [address]    Culture    Final    NO GROWTH Performed at [facility], [address], [place], [place] [address]    Report Status 05/12/2023 FINAL  Final         Radiology Studies: US Abdomen Limited RUQ (LIVER/GB) Result Date: 05/11/2023 CLINICAL DATA:  Cirrhosis.  Cholecystectomy. EXAM: ULTRASOUND ABDOMEN LIMITED RIGHT UPPER QUADRANT COMPARISON:  CT stone study 05/10/2023 FINDINGS: Gallbladder: Surgically absent. Common bile duct: Diameter:  4 mm Liver: Markedly echogenic liver parenchyma with poor acoustic through transmission. 5.8 x 2.7 x 4.5 cm ill-defined hypoechoic region identified in the right hepatic lobe. Portal vein is patent on color Doppler imaging with normal direction of blood flow towards the liver. Other: None. IMPRESSION: 1. Markedly echogenic liver parenchyma with poor acoustic through transmission. Imaging features compatible with hepatic steatosis. 2. 5.8 x 2.7 x 4.5 cm ill-defined hypoechoic region in the right hepatic lobe. This is nonspecific and could be related to focal fatty sparing. MRI abdomen with and without contrast recommended to further evaluate. Electronically Signed   By: Donnal Fusi M.D.   On: 05/11/2023 08:03   DG CHEST PORT 1 VIEW Result Date: 05/11/2023 CLINICAL DATA:  [id number] Dyspnea [id number] EXAM: PORTABLE CHEST 1 VIEW COMPARISON:  Chest x-ray 11/19/2013 FINDINGS: The heart and mediastinal contours are within normal limits. No focal consolidation. No pulmonary edema. No pleural effusion. No pneumothorax. No acute osseous abnormality. Multiple vascular clips overlie bilateral neck. IMPRESSION: No active disease. Electronically Signed   By: Morgane  Naveau M.D.   On: 05/11/2023 00:39           LOS: 2 days   Time spent= 35 mins    Maggie Schooner, MD [facility]  If 7PM-7AM, please contact night-coverage  05/12/2023, 11:19 AM

## 2023-05-13 DIAGNOSIS — N12 Tubulo-interstitial nephritis, not specified as acute or chronic: Secondary | ICD-10-CM | POA: Diagnosis not present

## 2023-05-13 LAB — CBC
HCT: 40.8 % (ref 36.0–46.0)
Hemoglobin: 12.9 g/dL (ref 12.0–15.0)
MCH: 28.8 pg (ref 26.0–34.0)
MCHC: 31.6 g/dL (ref 30.0–36.0)
MCV: 91.1 fL (ref 80.0–100.0)
Platelets: 289 10*3/uL (ref 150–400)
RBC: 4.48 MIL/uL (ref 3.87–5.11)
RDW: 13.2 % (ref 11.5–15.5)
WBC: 10.4 10*3/uL (ref 4.0–10.5)
nRBC: 0 % (ref 0.0–0.2)

## 2023-05-13 LAB — BASIC METABOLIC PANEL WITH GFR
Anion gap: 10 (ref 5–15)
BUN: 22 mg/dL (ref 8–23)
CO2: 25 mmol/L (ref 22–32)
Calcium: 8.7 mg/dL — ABNORMAL LOW (ref 8.9–10.3)
Chloride: 105 mmol/L (ref 98–111)
Creatinine, Ser: 0.71 mg/dL (ref 0.44–1.00)
GFR, Estimated: >60 mL/min (ref >60)
Glucose, Bld: 171 mg/dL — ABNORMAL HIGH (ref 70–99)
Potassium: 3.2 mmol/L — ABNORMAL LOW (ref 3.5–5.1)
Sodium: 140 mmol/L (ref 135–145)

## 2023-05-13 LAB — MAGNESIUM: Magnesium: 2.2 mg/dL (ref 1.7–2.4)

## 2023-05-13 LAB — GLUCOSE, CAPILLARY
Glucose-Capillary: 163 mg/dL — ABNORMAL HIGH (ref 70–99)
Glucose-Capillary: 210 mg/dL — ABNORMAL HIGH (ref 70–99)

## 2023-05-13 MED ORDER — CIPROFLOXACIN HCL 500 MG PO TABS: 500.0000 mg | ORAL_TABLET | Freq: Two times a day (BID) | ORAL | 0 refills | Status: AC

## 2023-05-13 MED ORDER — POTASSIUM CHLORIDE CRYS ER 20 MEQ PO TBCR
40.0000 meq | EXTENDED_RELEASE_TABLET | ORAL | Status: DC
  Administered 2023-05-13: 40 meq via ORAL
  Filled 2023-05-13: qty 2

## 2023-05-13 NOTE — Discharge Summary (Signed)
 Physician Discharge Summary  Donna [name] ZOX:[id number] DOB: 09/12/1955 DOA: 05/10/2023  PCP: Aliene Beams, MD  Admit date: 05/10/2023 Discharge date: 05/13/2023  Admitted From: Home Disposition:  Home  Recommendations for Outpatient Follow-up:  Follow up with PCP in 1-2 weeks Please obtain BMP/CBC in one week your next doctors visit.  6 days of p.o. Cipro to complete total 10-day course   Discharge Condition: Stable CODE STATUS: Full code Diet recommendation: Diabetic  Brief/Interim Summary: Brief Narrative:  68 year old with history of DM2, HTN, renal stone, chronic pain, endometrial cancer status post total hysterectomy, gout, palpitations, GERD, HTN admitted for nausea, vomiting and dysuria.  She was found to have pyelonephritis therefore started on Rocephin.  Cultures remain negative as they were done after patient received antibiotics.  Today she is doing significantly well and tells me she feels " 500" times better.  Will discharge the patient on 6 more days of oral Cipro this will complete total 10-day course of antibiotics. Medically stable for discharge.  She will need to follow-up outpatient PCP in the next 1-2 weeks.   Assessment & Plan:  Principal Problem:   Pyelonephritis Active Problems:   Essential hypertension, benign   Hx of total thyroidectomy with radical neck dissection   Endometrial cancer (HCC)   Fatty infiltration of liver   Chronic pain syndrome   Hypokalemia   SVT (supraventricular tachycardia) (HCC)   Hyponatremia   Right-sided uncomplicated pyelonephritis - Right-sided pyelonephritis without any evidence of obstruction.  Patient feeling significantly well, cultures remain negative.  Will transition to p.o. Cipro for 6 more days to complete total 10-day course of antibiotics.  Hepatic steatosis Right lower lobe Liver lesion - Noted to have fatty liver on the ultrasound.  Also has liver lesion.  Patient will need outpatient MRI.  Acute  hepatitis panel is negative  Hypokalemia/hypophosphatemia - As needed repletion  Hyperlipidemia - Statin  Essential hypertension - On Toprol-XL  Diabetes mellitus type 2 - Home regimen  Asthma - as needed bronchodilators  Endometrial cancer status post uterine resection - Follow outpatient GYN   DVT prophylaxis: SCDs Start: 05/11/23 0047    Code Status: Full Code Family Communication:   Status is: Inpatient Remains inpatient appropriate because: Discharge    Subjective: Patient tells me she feels 500x better today and would like to go home.  Examination:  General exam: Appears calm and comfortable  Respiratory system: Clear to auscultation. Respiratory effort normal. Cardiovascular system: S1 & S2 heard, RRR. No JVD, murmurs, rubs, gallops or clicks. No pedal edema. Gastrointestinal system: Abdomen is nondistended, soft and nontender. No organomegaly or masses felt. Normal bowel sounds heard. Central nervous system: Alert and oriented. No focal neurological deficits. Extremities: Symmetric 5 x 5 power. Skin: No rashes, lesions or ulcers Psychiatry: Judgement and insight appear normal. Mood & affect appropriate.    Discharge Diagnoses:  Principal Problem:   Pyelonephritis Active Problems:   Essential hypertension, benign   Hx of total thyroidectomy with radical neck dissection   Endometrial cancer (HCC)   Fatty infiltration of liver   Chronic pain syndrome   Hypokalemia   SVT (supraventricular tachycardia) (HCC)   Hyponatremia      Discharge Exam: Vitals:   05/12/23 2015 05/13/23 0400  BP: 139/70 128/81  Pulse: 78 79  Resp: 18 18  Temp: 98.3 F (36.8 C) 98 F (36.7 C)  SpO2: 97% 98%   Vitals:   05/12/23 0906 05/12/23 1204 05/12/23 2015 05/13/23 0400  BP: 118/66 102/73 139/70 128/81  Pulse: 81 77 78 79  Resp:  17 18 18   Temp:  97.9 F (36.6 C) 98.3 F (36.8 C) 98 F (36.7 C)  TempSrc:  Oral Oral Oral  SpO2:  96% 97% 98%  Weight:       Height:          Discharge Instructions   Allergies as of 05/13/2023       Reactions   Glimepiride Other (See Comments)   Per patient causes severe gas pain   Morphine And Codeine Nausea And Vomiting   Semaglutide(0.25 Or 0.5mg -dos) Other (See Comments)   Colcrys [colchicine] Rash   Lisinopril Rash        Medication List     TAKE these medications    augmented betamethasone dipropionate 0.05 % cream Commonly known as: DIPROLENE-AF Apply 1 application  topically 2 (two) times daily as needed.   blood glucose meter kit and supplies Kit Dispense based on patient and insurance preference. Use up to four times daily as directed. (FOR ICD-9 250.00, 250.01).   cephALEXin 500 MG capsule Commonly known as: KEFLEX Take 1 capsule (500 mg total) by mouth 4 (four) times daily for 10 days.   ciprofloxacin 500 MG tablet Commonly known as: Cipro Take 1 tablet (500 mg total) by mouth 2 (two) times daily for 6 days.   cyanocobalamin 1000 MCG tablet Commonly known as: VITAMIN B12 Take 5,000 mcg by mouth daily.   cyclobenzaprine 10 MG tablet Commonly known as: FLEXERIL Take 1 tablet (10 mg total) by mouth at bedtime.   Farxiga 5 MG Tabs tablet Generic drug: dapagliflozin propanediol Take 5 mg by mouth daily.   Fluocinolone Acetonide 0.01 % Oil Place 5 drops in ear(s) 2 (two) times daily as needed ("eczema in ears").   glipiZIDE 10 MG 24 hr tablet Commonly known as: GLUCOTROL XL Take 10 mg by mouth every morning.   glucose blood test strip E11.9. Dispense type to match meter. Use bid to check sugars. Use as instructed   HYDROcodone-acetaminophen 5-325 MG tablet Commonly known as: NORCO/VICODIN Take 1 tablet by mouth every 6 (six) hours as needed for severe pain.   Januvia 100 MG tablet Generic drug: sitaGLIPtin Take 100 mg by mouth daily.   Lancets Misc. Misc E11.9 Dispense type to match meter.   levocetirizine 5 MG tablet Commonly known as: XYZAL Take 5 mg by  mouth daily.   losartan-hydrochlorothiazide 100-25 MG tablet Commonly known as: HYZAAR Take 1 tablet by mouth daily.   meloxicam 7.5 MG tablet Commonly known as: MOBIC Take 7.5 mg by mouth daily as needed for pain.   metFORMIN 500 MG tablet Commonly known as: GLUCOPHAGE Take 1,000 mg by mouth daily with breakfast.   metoprolol succinate 100 MG 24 hr tablet Commonly known as: TOPROL-XL Take 1.5 tablets (150 mg total) by mouth daily. Take with or immediately following a meal.   montelukast 10 MG tablet Commonly known as: SINGULAIR Take 1 tablet (10 mg total) by mouth at bedtime.   MULTIPLE VITAMIN PO Take 1 tablet by mouth daily.   omega-3 acid ethyl esters 1 g capsule Commonly known as: LOVAZA Take 2 capsules by mouth daily.   ondansetron 4 MG disintegrating tablet Commonly known as: ZOFRAN-ODT Take 1 tablet (4 mg total) by mouth every 8 (eight) hours as needed for nausea or vomiting.   pantoprazole 40 MG tablet Commonly known as: PROTONIX Take 1 tablet (40 mg total) by mouth every evening.   pravastatin 40 MG tablet Commonly known  as: PRAVACHOL Take 40 mg by mouth daily.   ProAir HFA 108 (90 Base) MCG/ACT inhaler Generic drug: albuterol INHALE 2 PUFFS INTO THE LUNGS EVERY 4 (FOUR) HOURS AS NEEDED FOR WHEEZING OR SHORTNESS OF BREATH.   tamsulosin 0.4 MG Caps capsule Commonly known as: FLOMAX Take 0.4 mg by mouth daily.   traMADol 50 MG tablet Commonly known as: ULTRAM Take 1 tablet (50 mg total) by mouth every 8 (eight) hours as needed for severe pain. What changed: when to take this   Vitamin D (Ergocalciferol) 1.25 MG (50000 UNIT) Caps capsule Commonly known as: DRISDOL TAKE 1 CAPSULE (50,000 UNITS TOTAL) BY MOUTH 3 (THREE) TIMES A WEEK.        Allergies  Allergen Reactions   Glimepiride Other (See Comments)    Per patient causes severe gas pain   Morphine And Codeine Nausea And Vomiting   Semaglutide(0.25 Or 0.5mg -Dos) Other (See Comments)    Colcrys [Colchicine] Rash   Lisinopril Rash    You were cared for by a hospitalist during your hospital stay. If you have any questions about your discharge medications or the care you received while you were in the hospital after you are discharged, you can call the unit and asked to speak with the hospitalist on call if the hospitalist that took care of you is not available. Once you are discharged, your primary care physician will handle any further medical issues. Please note that no refills for any discharge medications will be authorized once you are discharged, as it is imperative that you return to your primary care physician (or establish a relationship with a primary care physician if you do not have one) for your aftercare needs so that they can reassess your need for medications and monitor your lab values.  You were cared for by a hospitalist during your hospital stay. If you have any questions about your discharge medications or the care you received while you were in the hospital after you are discharged, you can call the unit and asked to speak with the hospitalist on call if the hospitalist that took care of you is not available. Once you are discharged, your primary care physician will handle any further medical issues. Please note that NO REFILLS for any discharge medications will be authorized once you are discharged, as it is imperative that you return to your primary care physician (or establish a relationship with a primary care physician if you do not have one) for your aftercare needs so that they can reassess your need for medications and monitor your lab values.  Please request your Prim.MD to go over all Hospital Tests and Procedure/Radiological results at the follow up, please get all Hospital records sent to your Prim MD by signing hospital release before you go home.  Get CBC, CMP, 2 view Chest X ray checked  by Primary MD during your next visit or SNF MD in 5-7 days ( we  routinely change or add medications that can affect your baseline labs and fluid status, therefore we recommend that you get the mentioned basic workup next visit with your PCP, your PCP may decide not to get them or add new tests based on their clinical decision)  On your next visit with your primary care physician please Get Medicines reviewed and adjusted.  If you experience worsening of your admission symptoms, develop shortness of breath, life threatening emergency, suicidal or homicidal thoughts you must seek medical attention immediately by calling 911 or calling your MD  immediately  if symptoms less severe.  You Must read complete instructions/literature along with all the possible adverse reactions/side effects for all the Medicines you take and that have been prescribed to you. Take any new Medicines after you have completely understood and accpet all the possible adverse reactions/side effects.   Do not drive, operate heavy machinery, perform activities at heights, swimming or participation in water activities or provide baby sitting services if your were admitted for syncope or siezures until you have seen by Primary MD or a Neurologist and advised to do so again.  Do not drive when taking Pain medications.   Procedures/Studies: US Abdomen Limited RUQ (LIVER/GB) Result Date: 05/11/2023 CLINICAL DATA:  Cirrhosis.  Cholecystectomy. EXAM: ULTRASOUND ABDOMEN LIMITED RIGHT UPPER QUADRANT COMPARISON:  CT stone study 05/10/2023 FINDINGS: Gallbladder: Surgically absent. Common bile duct: Diameter: 4 mm Liver: Markedly echogenic liver parenchyma with poor acoustic through transmission. 5.8 x 2.7 x 4.5 cm ill-defined hypoechoic region identified in the right hepatic lobe. Portal vein is patent on color Doppler imaging with normal direction of blood flow towards the liver. Other: None. IMPRESSION: 1. Markedly echogenic liver parenchyma with poor acoustic through transmission. Imaging features  compatible with hepatic steatosis. 2. 5.8 x 2.7 x 4.5 cm ill-defined hypoechoic region in the right hepatic lobe. This is nonspecific and could be related to focal fatty sparing. MRI abdomen with and without contrast recommended to further evaluate. Electronically Signed   By: Kennith Center M.D.   On: 05/11/2023 08:03   DG CHEST PORT 1 VIEW Result Date: 05/11/2023 CLINICAL DATA:  [id number] Dyspnea [id number] EXAM: PORTABLE CHEST 1 VIEW COMPARISON:  Chest x-ray 11/19/2013 FINDINGS: The heart and mediastinal contours are within normal limits. No focal consolidation. No pulmonary edema. No pleural effusion. No pneumothorax. No acute osseous abnormality. Multiple vascular clips overlie bilateral neck. IMPRESSION: No active disease. Electronically Signed   By: Tish Frederickson M.D.   On: 05/11/2023 00:39   CT RENAL STONE STUDY Result Date: 05/10/2023 CLINICAL DATA:  Abdominal pain with cysts stone suspected EXAM: CT ABDOMEN AND PELVIS WITHOUT CONTRAST TECHNIQUE: Multidetector CT imaging of the abdomen and pelvis was performed following the standard protocol without IV contrast. RADIATION DOSE REDUCTION: This exam was performed according to the departmental dose-optimization program which includes automated exposure control, adjustment of the mA and/or kV according to patient size and/or use of iterative reconstruction technique. COMPARISON:  11/12/2019 FINDINGS: Lower chest:  No contributory findings. Hepatobiliary: Hepatic steatosis. The caudate lobe and fissures are prominent in size with borderline surface lobulation.Absent gallbladder. No biliary dilatation. Pancreas: Generalized fatty infiltration. Spleen: Unremarkable. Adrenals/Urinary Tract: Negative adrenals. Mild right hydronephrosis and low-density renal expansion with perinephric stranding. No ureteral calculus or other obstructing process seen currently. Unremarkable bladder for the degree of distension. Stomach/Bowel: No obstruction. No visible bowel  inflammation. Numerous colonic diverticula. Vascular/Lymphatic: No acute vascular abnormality. Mild scattered atheromatous calcification of the aorta. No mass or adenopathy. Reproductive:Hysterectomy Other: No ascites or pneumoperitoneum. Musculoskeletal: No acute abnormalities.  Generalized degeneration. IMPRESSION: Right hydronephrosis and perinephric stranding but no underlying calculus or other visible obstructive process. Hepatic steatosis and potential for cirrhosis. Extensive colonic diverticulosis. Electronically Signed   By: Tiburcio Pea M.D.   On: 05/10/2023 06:27     The results of significant diagnostics from this hospitalization (including imaging, microbiology, ancillary and laboratory) are listed below for reference.     Microbiology: Recent Results (from the past 240 hours)  Urine Culture     Status: None   Collection  Time: 05/10/23  9:44 PM   Specimen: Urine, Clean Catch  Result Value Ref Range Status   Specimen Description   Final    URINE, CLEAN CATCH Performed at [facility], [address] [place], [place] [address]    Special Requests   Final    NONE Performed at [facility], [address], [place], [place] [address]    Culture   Final    NO GROWTH Performed at [facility], [address], [place], [place] [address]    Report Status 05/12/2023 FINAL  Final     Labs: BNP (last 3 results) No results for input(s): "BNP" in the last 8760 hours. Basic Metabolic Panel: Recent Labs  Lab 05/10/23 0523 05/10/23 2144 05/11/23 0000 05/11/23 0232 05/12/23 0336 05/13/23 0411  NA 135 132*  --  133* 139 140  K 3.5 3.2*  --  3.4* 3.3* 3.2*  CL 96* 97*  --  101 106 105  CO2 26 20*  --  25 26 25   GLUCOSE 335* 272*  --  216* 148* 171*  BUN 23 23  --  23 19 22   CREATININE 0.95 0.84  --  0.91 0.71 0.71  CALCIUM 9.6 8.6*  --  7.9* 8.0* 8.7*  MG  --   --  1.9 2.0 2.1 2.2  PHOS  --   --  4.1 3.8 2.2*  --    Liver  Function Tests: Recent Labs  Lab 05/10/23 0523 05/11/23 0232  AST 31 32  ALT 31 25  ALKPHOS 71 52  BILITOT 0.8 0.6  PROT 8.6* 6.7  ALBUMIN 4.4 3.4*   No results for input(s): "LIPASE", "AMYLASE" in the last 168 hours. No results for input(s): "AMMONIA" in the last 168 hours. CBC: Recent Labs  Lab 05/10/23 0523 05/10/23 2144 05/11/23 0232 05/12/23 0336 05/13/23 0411  WBC 15.6* 15.4* 14.2* 9.5 10.4  NEUTROABS  --  13.0*  --   --   --   HGB 15.4* 14.2 12.6 12.0 12.9  HCT 48.0* 43.7 38.6 39.5 40.8  MCV 89.2 87.4 87.9 93.6 91.1  PLT 375 336 300 277 289   Cardiac Enzymes: Recent Labs  Lab 05/11/23 0000  CKTOTAL 128   BNP: Invalid input(s): "POCBNP" CBG: Recent Labs  Lab 05/12/23 1609 05/12/23 2011 05/12/23 2349 05/13/23 0357 05/13/23 0734  GLUCAP 168* 190* 171* 163* 210*   D-Dimer No results for input(s): "DDIMER" in the last 72 hours. Hgb A1c Recent Labs    05/11/23 0232  HGBA1C 7.9*   Lipid Profile Recent Labs    05/11/23 0232  CHOL 101  HDL 36*  LDLCALC 44  TRIG 413  CHOLHDL 2.8   Thyroid function studies Recent Labs    05/11/23 0232  TSH 1.156   Anemia work up No results for input(s): "VITAMINB12", "FOLATE", "FERRITIN", "TIBC", "IRON", "RETICCTPCT" in the last 72 hours. Urinalysis    Component Value Date/Time   COLORURINE AMBER (A) 05/10/2023 2144   APPEARANCEUR CLEAR 05/10/2023 2144   LABSPEC 1.033 (H) 05/10/2023 2144   PHURINE 5.0 05/10/2023 2144   GLUCOSEU >=500 (A) 05/10/2023 2144   HGBUR MODERATE (A) 05/10/2023 2144   BILIRUBINUR NEGATIVE 05/10/2023 2144   BILIRUBINUR negative 10/17/2016 0946   KETONESUR 20 (A) 05/10/2023 2144   PROTEINUR 100 (A) 05/10/2023 2144   UROBILINOGEN 0.2 10/17/2016 0946   UROBILINOGEN 0.2 11/19/2013 1414   NITRITE POSITIVE (A) 05/10/2023 2144   LEUKOCYTESUR NEGATIVE 05/10/2023 2144   Sepsis  Labs Recent Labs  Lab 05/10/23 2144 05/11/23 0232 05/12/23 0336 05/13/23 0411  WBC 15.4* 14.2* 9.5  10.4   Microbiology Recent Results (from the past 240 hours)  Urine Culture     Status: None   Collection Time: 05/10/23  9:44 PM   Specimen: Urine, Clean Catch  Result Value Ref Range Status   Specimen Description   Final    URINE, CLEAN CATCH Performed at [facility], [address] [place], [place] [address]    Special Requests   Final    NONE Performed at [facility], [address] [place], [place] [address]    Culture   Final    NO GROWTH Performed at [facility], [address], [place], [place] [address]    Report Status 05/12/2023 FINAL  Final     Time coordinating discharge:  I have spent 35 minutes face to face with the patient and on the ward discussing the patients care, assessment, plan and disposition with other care givers. >50% of the time was devoted counseling the patient about the risks and benefits of treatment/Discharge disposition and coordinating care.   SIGNED:   Maggie Schooner, MD  [facility] 05/13/2023, 11:47 AM   If 7PM-7AM, please contact night-coverage

## 2023-05-13 NOTE — Plan of Care (Signed)

## 2023-05-19 DIAGNOSIS — N12 Tubulo-interstitial nephritis, not specified as acute or chronic: Secondary | ICD-10-CM | POA: Diagnosis not present

## 2023-05-19 DIAGNOSIS — K769 Liver disease, unspecified: Secondary | ICD-10-CM | POA: Diagnosis not present

## 2023-05-20 ENCOUNTER — Other Ambulatory Visit: Payer: Self-pay | Admitting: Family Medicine

## 2023-05-20 DIAGNOSIS — K769 Liver disease, unspecified: Secondary | ICD-10-CM

## 2023-06-22 ENCOUNTER — Ambulatory Visit
Admission: RE | Admit: 2023-06-22 | Discharge: 2023-06-22 | Disposition: A | Discharge status: Home / Self Care | Source: Ambulatory Visit | Attending: Family Medicine | Admitting: Family Medicine

## 2023-06-22 DIAGNOSIS — K5730 Diverticulosis of large intestine without perforation or abscess without bleeding: Secondary | ICD-10-CM | POA: Diagnosis not present

## 2023-06-22 DIAGNOSIS — K769 Liver disease, unspecified: Secondary | ICD-10-CM

## 2023-06-22 DIAGNOSIS — R599 Enlarged lymph nodes, unspecified: Secondary | ICD-10-CM | POA: Diagnosis not present

## 2023-06-22 DIAGNOSIS — K760 Fatty (change of) liver, not elsewhere classified: Secondary | ICD-10-CM | POA: Diagnosis not present

## 2023-06-22 MED ORDER — GADOPICLENOL 0.5 MMOL/ML IV SOLN
10.0000 mL | Freq: Once | INTRAVENOUS | Status: AC | PRN
Start: 2023-06-22 — End: 2023-06-22
  Administered 2023-06-22: 10 mL via INTRAVENOUS

## 2023-06-25 DIAGNOSIS — E118 Type 2 diabetes mellitus with unspecified complications: Secondary | ICD-10-CM | POA: Diagnosis not present

## 2023-06-25 DIAGNOSIS — G894 Chronic pain syndrome: Secondary | ICD-10-CM | POA: Diagnosis not present

## 2023-06-25 DIAGNOSIS — Z79899 Other long term (current) drug therapy: Secondary | ICD-10-CM | POA: Diagnosis not present

## 2023-06-25 DIAGNOSIS — E538 Deficiency of other specified B group vitamins: Secondary | ICD-10-CM | POA: Diagnosis not present

## 2023-06-25 DIAGNOSIS — E7800 Pure hypercholesterolemia, unspecified: Secondary | ICD-10-CM | POA: Diagnosis not present

## 2023-06-25 DIAGNOSIS — E559 Vitamin D deficiency, unspecified: Secondary | ICD-10-CM | POA: Diagnosis not present

## 2023-06-25 DIAGNOSIS — E1165 Type 2 diabetes mellitus with hyperglycemia: Secondary | ICD-10-CM | POA: Diagnosis not present

## 2023-07-25 DIAGNOSIS — H524 Presbyopia: Secondary | ICD-10-CM | POA: Diagnosis not present

## 2023-07-25 DIAGNOSIS — H2513 Age-related nuclear cataract, bilateral: Secondary | ICD-10-CM | POA: Diagnosis not present

## 2023-07-25 DIAGNOSIS — H35033 Hypertensive retinopathy, bilateral: Secondary | ICD-10-CM | POA: Diagnosis not present

## 2023-07-25 DIAGNOSIS — E119 Type 2 diabetes mellitus without complications: Secondary | ICD-10-CM | POA: Diagnosis not present

## 2023-09-23 DIAGNOSIS — E118 Type 2 diabetes mellitus with unspecified complications: Secondary | ICD-10-CM | POA: Diagnosis not present

## 2023-09-23 DIAGNOSIS — J302 Other seasonal allergic rhinitis: Secondary | ICD-10-CM | POA: Diagnosis not present

## 2023-09-23 DIAGNOSIS — H6991 Unspecified Eustachian tube disorder, right ear: Secondary | ICD-10-CM | POA: Diagnosis not present

## 2023-09-23 DIAGNOSIS — N1330 Unspecified hydronephrosis: Secondary | ICD-10-CM | POA: Diagnosis not present

## 2023-09-23 DIAGNOSIS — E7800 Pure hypercholesterolemia, unspecified: Secondary | ICD-10-CM | POA: Diagnosis not present

## 2023-09-23 DIAGNOSIS — R8290 Unspecified abnormal findings in urine: Secondary | ICD-10-CM | POA: Diagnosis not present

## 2023-09-23 DIAGNOSIS — I10 Essential (primary) hypertension: Secondary | ICD-10-CM | POA: Diagnosis not present

## 2023-09-24 ENCOUNTER — Other Ambulatory Visit: Payer: Self-pay | Admitting: Family Medicine

## 2023-09-24 DIAGNOSIS — N1330 Unspecified hydronephrosis: Secondary | ICD-10-CM

## 2023-09-25 ENCOUNTER — Inpatient Hospital Stay
Admission: RE | Admit: 2023-09-25 | Discharge: 2023-09-25 | Disposition: A | Discharge status: Home / Self Care | Source: Ambulatory Visit | Attending: Family Medicine

## 2023-09-25 DIAGNOSIS — N1330 Unspecified hydronephrosis: Secondary | ICD-10-CM | POA: Diagnosis not present

## 2023-10-02 DIAGNOSIS — Z1212 Encounter for screening for malignant neoplasm of rectum: Secondary | ICD-10-CM | POA: Diagnosis not present

## 2023-10-02 DIAGNOSIS — Z1211 Encounter for screening for malignant neoplasm of colon: Secondary | ICD-10-CM | POA: Diagnosis not present

## 2023-10-30 ENCOUNTER — Encounter: Payer: Self-pay | Admitting: Internal Medicine

## 2023-10-30 ENCOUNTER — Ambulatory Visit: Attending: Internal Medicine | Admitting: Internal Medicine

## 2023-10-30 VITALS — BP 158/82 | HR 68 | Ht 64.0 in | Wt 228.6 lb

## 2023-10-30 DIAGNOSIS — I471 Supraventricular tachycardia, unspecified: Secondary | ICD-10-CM | POA: Diagnosis not present

## 2023-10-30 DIAGNOSIS — I10 Essential (primary) hypertension: Secondary | ICD-10-CM

## 2023-10-30 DIAGNOSIS — E782 Mixed hyperlipidemia: Secondary | ICD-10-CM

## 2023-10-30 NOTE — Patient Instructions (Signed)
  Your physician recommends that you continue on your current medications as directed. Please refer to the Current Medication list given to you today.  *If you need a refill on your cardiac medications before your next appointment, please call your pharmacy*   Lab Work: NONE ORDERED  TODAY   If you have labs (blood work) drawn today and your tests are completely normal, you will receive your results only by: MyChart Message (if you have MyChart) OR A paper copy in the mail If you have any lab test that is abnormal or we need to change your treatment, we will call you to review the results.  Testing/Procedures: NONE ORDERED  TODAY    Follow-Up: At [facility], you and your health needs are our priority.  As part of our continuing mission to provide you with exceptional heart care, our providers are all part of one team.  This team includes your primary Cardiologist (physician) and Advanced Practice Providers or APPs (Physician Assistants and Nurse Practitioners) who all work together to provide you with the care you need, when you need it.   Your next appointment:   1 year(s)    Provider:  DR LONI   We recommend signing up for the patient portal called MyChart.  Sign up information is provided on this After Visit Summary.  MyChart is used to connect with patients for Virtual Visits (Telemedicine).  Patients are able to view lab/test results, encounter notes, upcoming appointments, etc.  Non-urgent messages can be sent to your provider as well.   To learn more about what you can do with MyChart, go to [email or web address].   Other Instructions

## 2023-10-30 NOTE — Progress Notes (Signed)
  Cardiology Office Note:  .   Date:  10/30/2023  ID:  Jenkins Harl Haddock, DOB 12-27-1955, MRN [id number] PCP: Rolinda Millman, MD  [facility] Providers Cardiologist:  Alvan Ronal BRAVO, MD (Inactive)    History of Present Illness: .   Bani Gianfrancesco is a 68 y.o. female.  Discussed the use of AI scribe software for clinical note transcription with the patient, who gave verbal consent to proceed.  History of Present Illness Lazariah Savard is a 68 year old female with palpitations who presents for evaluation of her cardiac condition. She was referred by Dr. Alvan for evaluation of palpitations.  She experiences palpitations, particularly during stress, and is currently on metoprolol  150 mg XL daily following episodes of supraventricular tachycardia observed on a cardiac monitor. She has hypertension, managed with losartan  HCTZ 100 mg/25 mg daily, and notes elevated blood pressure in new environments due to stress. Her diabetes mellitus type 2 is managed with dietary modifications, though her diet includes high intake of potatoes and fried foods, affecting her blood sugar and triglyceride levels. Cholesterol management includes pravastatin  and fenofibrate 145 mg, following discontinuation of Lovaza due to swallowing difficulties.    ROS: negative except per HPI above.  Studies Reviewed: SABRA   EKG Interpretation Date/Time:  Thursday October 30 2023 08:59:41 EDT Ventricular Rate:  66 PR Interval:  192 QRS Duration:  84 QT Interval:  420 QTC Calculation: 440 R Axis:   72  Text Interpretation: Normal sinus rhythm Normal ECG When compared with ECG of 11-May-2023 02:21, QT has shortened Confirmed by Loni Rushing ([id number]) on 10/30/2023 9:20:24 AM    Results LABS LDL: 60 mg/dL (94/71/7974) Triglycerides: elevated (06/25/2023)  DIAGNOSTIC Cardiac monitor: episodes of supraventricular tachycardia (SVT) Risk Assessment/Calculations:       Physical Exam:   VS:  BP (!)  158/82   Pulse 68   Ht 5' 4 (1.626 m)   Wt 228 lb 9.6 oz (103.7 kg)   SpO2 96%   BMI 39.24 kg/m    Wt Readings from Last 3 Encounters:  10/30/23 228 lb 9.6 oz (103.7 kg)  05/11/23 230 lb 2.6 oz (104.4 kg)  11/05/22 235 lb 6.4 oz (106.8 kg)     Physical Exam GENERAL: Alert, cooperative, well developed, no acute distress HEENT: Normocephalic, normal oropharynx, moist mucous membranes CHEST: Clear to auscultation bilaterally, no wheezes, rhonchi, or crackles CARDIOVASCULAR: Normal heart rate and rhythm, S1 and S2 normal without murmurs ABDOMEN: Soft, non-tender, non-distended, without organomegaly, normal bowel sounds EXTREMITIES: No cyanosis or edema NEUROLOGICAL: Cranial nerves grossly intact, moves all extremities without gross motor or sensory deficit   ASSESSMENT AND PLAN: .    Assessment and Plan Assessment & Plan Supraventricular tachycardia (SVT) Episodes of SVT exacerbated by stress. Current management with metoprolol  is appropriate. - Continue metoprolol  150 mg XL daily. - Advise to reduce caffeine intake.  Hypertension Blood pressure elevated, possibly due to stress. Managed with losartan  HCTZ and metoprolol . - Recheck blood pressure before leaving the clinic, monitor at home if elevated. - Continue losartan  HCTZ 100 mg/25 mg daily. - Continue metoprolol  150 mg XL daily.  Hypertriglyceridemia Triglycerides elevated as confirmed by fasting labs. - Continue fenofibrate 145 mg daily and pravastatin  40 mg daily. Fibrate recently started and followed by PCP. - Ensure fasting before future lab draws.        Rushing Loni, MD, FACC
